# Patient Record
Sex: Male | Born: 1940
Health system: Southern US, Community
[De-identification: ages and names within clinical notes are randomized; demographics above are authoritative.]

## PROBLEM LIST (undated history)

## (undated) DIAGNOSIS — J310 Chronic rhinitis: Secondary | ICD-10-CM

## (undated) DIAGNOSIS — F419 Anxiety disorder, unspecified: Secondary | ICD-10-CM

## (undated) DIAGNOSIS — I639 Cerebral infarction, unspecified: Secondary | ICD-10-CM

## (undated) DIAGNOSIS — G4733 Obstructive sleep apnea (adult) (pediatric): Secondary | ICD-10-CM

## (undated) DIAGNOSIS — E039 Hypothyroidism, unspecified: Secondary | ICD-10-CM

## (undated) DIAGNOSIS — S065X9A Traumatic subdural hemorrhage with loss of consciousness of unspecified duration, initial encounter: Secondary | ICD-10-CM

## (undated) DIAGNOSIS — F329 Major depressive disorder, single episode, unspecified: Secondary | ICD-10-CM

## (undated) DIAGNOSIS — M1990 Unspecified osteoarthritis, unspecified site: Secondary | ICD-10-CM

## (undated) DIAGNOSIS — F32A Depression, unspecified: Secondary | ICD-10-CM

## (undated) DIAGNOSIS — R519 Headache, unspecified: Secondary | ICD-10-CM

## (undated) DIAGNOSIS — I2510 Atherosclerotic heart disease of native coronary artery without angina pectoris: Secondary | ICD-10-CM

## (undated) DIAGNOSIS — R51 Headache: Secondary | ICD-10-CM

## (undated) DIAGNOSIS — K219 Gastro-esophageal reflux disease without esophagitis: Secondary | ICD-10-CM

## (undated) DIAGNOSIS — E785 Hyperlipidemia, unspecified: Secondary | ICD-10-CM

## (undated) DIAGNOSIS — R0600 Dyspnea, unspecified: Secondary | ICD-10-CM

## (undated) DIAGNOSIS — I10 Essential (primary) hypertension: Secondary | ICD-10-CM

## (undated) HISTORY — PX: CORONARY ARTERY BYPASS GRAFT: SHX141

## (undated) HISTORY — DX: Major depressive disorder, single episode, unspecified: F32.9

## (undated) HISTORY — DX: Chronic rhinitis: J31.0

## (undated) HISTORY — DX: Essential (primary) hypertension: I10

## (undated) HISTORY — DX: Hyperlipidemia, unspecified: E78.5

## (undated) HISTORY — PX: BRAIN HEMATOMA EVACUATION: SHX573

## (undated) HISTORY — DX: Anxiety disorder, unspecified: F41.9

## (undated) HISTORY — PX: CATARACT EXTRACTION: SUR2

## (undated) HISTORY — PX: ROTATOR CUFF REPAIR: SHX139

## (undated) HISTORY — DX: Depression, unspecified: F32.A

## (undated) HISTORY — DX: Traumatic subdural hemorrhage with loss of consciousness of unspecified duration, initial encounter: S06.5X9A

## (undated) HISTORY — DX: Obstructive sleep apnea (adult) (pediatric): G47.33

## (undated) HISTORY — DX: Atherosclerotic heart disease of native coronary artery without angina pectoris: I25.10

## (undated) HISTORY — PX: APPENDECTOMY: SHX54

---

## 2000-08-18 ENCOUNTER — Ambulatory Visit (HOSPITAL_COMMUNITY): Admission: RE | Admit: 2000-08-18 | Discharge: 2000-08-18 | Payer: Self-pay | Admitting: *Deleted

## 2002-06-01 DIAGNOSIS — S065X9A Traumatic subdural hemorrhage with loss of consciousness of unspecified duration, initial encounter: Secondary | ICD-10-CM

## 2002-06-01 DIAGNOSIS — S065XAA Traumatic subdural hemorrhage with loss of consciousness status unknown, initial encounter: Secondary | ICD-10-CM

## 2002-06-01 HISTORY — DX: Traumatic subdural hemorrhage with loss of consciousness status unknown, initial encounter: S06.5XAA

## 2002-06-01 HISTORY — DX: Traumatic subdural hemorrhage with loss of consciousness of unspecified duration, initial encounter: S06.5X9A

## 2003-06-02 HISTORY — PX: OTHER SURGICAL HISTORY: SHX169

## 2003-09-03 ENCOUNTER — Inpatient Hospital Stay (HOSPITAL_COMMUNITY): Admission: EM | Admit: 2003-09-03 | Discharge: 2003-09-09 | Payer: Self-pay | Admitting: Cardiology

## 2003-10-15 ENCOUNTER — Encounter
Admission: RE | Admit: 2003-10-15 | Discharge: 2003-10-15 | Payer: Self-pay | Admitting: Thoracic Surgery (Cardiothoracic Vascular Surgery)

## 2004-11-26 ENCOUNTER — Encounter: Payer: Self-pay | Admitting: Pulmonary Disease

## 2004-12-15 ENCOUNTER — Encounter: Payer: Self-pay | Admitting: Pulmonary Disease

## 2009-03-11 ENCOUNTER — Encounter: Payer: Self-pay | Admitting: Pulmonary Disease

## 2010-07-28 DIAGNOSIS — E785 Hyperlipidemia, unspecified: Secondary | ICD-10-CM

## 2010-07-28 DIAGNOSIS — J310 Chronic rhinitis: Secondary | ICD-10-CM | POA: Insufficient documentation

## 2010-07-28 DIAGNOSIS — F329 Major depressive disorder, single episode, unspecified: Secondary | ICD-10-CM

## 2010-07-28 DIAGNOSIS — I2510 Atherosclerotic heart disease of native coronary artery without angina pectoris: Secondary | ICD-10-CM | POA: Insufficient documentation

## 2010-07-28 DIAGNOSIS — F411 Generalized anxiety disorder: Secondary | ICD-10-CM

## 2010-07-28 DIAGNOSIS — I10 Essential (primary) hypertension: Secondary | ICD-10-CM

## 2010-07-28 DIAGNOSIS — F3289 Other specified depressive episodes: Secondary | ICD-10-CM

## 2010-07-28 HISTORY — DX: Hyperlipidemia, unspecified: E78.5

## 2010-07-28 HISTORY — DX: Generalized anxiety disorder: F41.1

## 2010-07-28 HISTORY — DX: Major depressive disorder, single episode, unspecified: F32.9

## 2010-07-28 HISTORY — DX: Essential (primary) hypertension: I10

## 2010-07-28 HISTORY — DX: Other specified depressive episodes: F32.89

## 2010-07-29 ENCOUNTER — Institutional Professional Consult (permissible substitution) (INDEPENDENT_AMBULATORY_CARE_PROVIDER_SITE_OTHER): Payer: Medicare Other | Admitting: Pulmonary Disease

## 2010-07-29 ENCOUNTER — Encounter: Payer: Self-pay | Admitting: Pulmonary Disease

## 2010-07-29 ENCOUNTER — Telehealth: Payer: Self-pay | Admitting: Pulmonary Disease

## 2010-07-29 DIAGNOSIS — G4733 Obstructive sleep apnea (adult) (pediatric): Secondary | ICD-10-CM | POA: Insufficient documentation

## 2010-07-29 DIAGNOSIS — G2581 Restless legs syndrome: Secondary | ICD-10-CM | POA: Insufficient documentation

## 2010-07-29 HISTORY — DX: Restless legs syndrome: G25.81

## 2010-07-29 HISTORY — DX: Obstructive sleep apnea (adult) (pediatric): G47.33

## 2010-08-07 NOTE — Progress Notes (Signed)
Summary: recieved fax  Phone Note Other Incoming Call back at [phone]   Caller: ann moore//[facility] Summary of Call: Wants to know if you received her fax for pt today. Initial call taken by: Darletta Moll,  July 29, 2010 12:20 PM

## 2010-08-12 NOTE — Assessment & Plan Note (Signed)
Summary: consult for osa, ?rls   Visit Type:  Initial Consult Copy to:  [name] Primary Provider/Referring Provider:  [name]  CC:  Sleep Consult for sleep apnea.  Currently on a cpap machine with oxygen at 2.5 LPM bled into cpap machine. Marland Kitchen  History of Present Illness: the pt is a 70y/o male who I have been asked to see for management of osa.  He was diagnosed with mild to moderate osa 2006, with AHI 18/hr and also found to have PLMS arousal index of 8/hr.  The pt was started on cpap, but did not see a difference in sleep quality.  He stayed on the device since it controlled his snoring.  The pt had a cpap titration study in 2010 with showed optimal pressure of 8cm, but again was noted to have large numbers of plms with an arousal index of 10/hr.  The pt recently received a new cpap machine, and is using nasal pillows.  He denies mouth opening being an issue.  He goes to bed at 10pm, and arises btw 5-8am depending on work schedule. His wife thinks he has restless sleep despite the cpap, and he does not feel completely rested in the am's upon arising.  His wife has not heard breakthru snoring, but the pt does not feel he sleeps that much better with cpap.  He has daytime sleepiness with periods of inactivity, and his wife states that he can fall asleep easily reading or watching tv.  The pt has occasional RLS symptoms in the evening, but his wife states that he does kick a lot at night.  She also believes the kicks awaken him at night, though he is not aware.  He does not recall if he has ever been on a dopamine agonist.  He did have an ONO last DEC that showed desat to 79% even with cpap, but only spent 40 sec the entire night less than 88% (not clinically significant).    Preventive Screening-Counseling & Management  Alcohol-Tobacco     Smoking Status: never  Current Medications (verified): 1)  Aspirin Ec Low Dose 81 Mg Tbec (Aspirin) .... Take 1 Tablet By Mouth Once A Day 2)   Vitamin D 1000 Unit Tabs (Cholecalciferol) .... Take 1 Tablet By Mouth Once A Day 3)  Flonase 50 Mcg/act Susp (Fluticasone Propionate) .... 2 Sprays in Each Nostril Each Am 4)  Lexapro 20 Mg Tabs (Escitalopram Oxalate) .... Take 1 Tablet By Mouth Once A Day 5)  Crestor 10 Mg Tabs (Rosuvastatin Calcium) .... Take 1 Tablet By Mouth Once A Day 6)  Carvedilol 12.5 Mg Tabs (Carvedilol) .... Take 1 Tablet By Mouth Two Times A Day 7)  Amlodipine Besylate 5 Mg Tabs (Amlodipine Besylate) .... Take 1 Tablet By Mouth Once A Day 8)  Super Probiotic  Caps (Probiotic Product) .... Take 1 Tablet By Mouth Once A Day 9)  Coq10 100 Mg Caps (Coenzyme Q10) .... Take 1 Tablet By Mouth Once A Day 10)  Fish Oil 1000 Mg Caps (Omega-3 Fatty Acids) .... Take 1 Tablet By Mouth Once A Day 11)  Astepro 0.15 % Soln (Azelastine Hcl) .... 2 Sprays in Each Nostril At Bedtime  Allergies (verified): No Known Drug Allergies  Past History:  Past Medical History:  OBSTRUCTIVE SLEEP APNEA (ICD-327.23)--AHI 18/hr 2006 DEPRESSION (ICD-311) HYPERTENSION (ICD-401.9) ANXIETY (ICD-300.00) CHRONIC RHINITIS (ICD-472.0) CORONARY ARTERY DISEASE (ICD-414.00) HYPERLIPIDEMIA (ICD-272.4)    Past Surgical History: heart bypass 2005 appendectomy 1970s subdural hematoma 2004  Family History: Reviewed  history and no changes required. heart disease: father cancer: son (leukemia) mother: stroke pt is an only child  Social History: Reviewed history and no changes required. Patient never smoked.  pt is married and lives with spouse, [name]. Pt has children. 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  The patient denies shortness of breath at rest, productive cough, non-productive cough, coughing up blood, chest  pain, irregular heartbeats, acid heartburn, indigestion, loss of appetite, weight change, abdominal pain, difficulty swallowing, sore throat, tooth/dental problems, itching, ear ache, depression, hand/feet swelling, joint stiffness or pain, rash, change in color of mucus, and fever.    Vital Signs:  Patient profile:   70 year old male Height:      69 inches Weight:      198.38 pounds BMI:     29.40 O2 Sat:      97 % on Room air Temp:     97.9 degrees F oral Pulse rate:   69 / minute BP sitting:   110 / 62  (left arm) Cuff size:   large  Vitals Entered By: Arman Filter LPN (July 29, 2010 11:40 AM)  O2 Flow:  Room air CC: Sleep Consult for sleep apnea.  Currently on a cpap machine with oxygen at 2.5 LPM bled into cpap machine.  Comments Medications reviewed with patient Arman Filter LPN  July 29, 2010 11:56 AM    Physical Exam  General:  ow male in nad Eyes:  PERRLA and EOMI.   Nose:  patent without discharge no skin breakdown or pressure necrosis from cpap mask  Mouth:  elongation of soft palate and uvula, no exudates seen.  Neck:  no jvd, tmg, LN Lungs:  clear to auscultation  Heart:  rrr, no mrg Abdomen:  soft and nontender, bs+ Extremities:  no significant edema, no cyanosis  pulses intact distally Neurologic:  awake and oriented, moves all 4  appears mildly sleepy   Impression & Recommendations:  Problem # 1:  OBSTRUCTIVE SLEEP APNEA (ICD-327.23) the pt has a h/o mild to mod osa which is being treated with cpap.  He has new equipment and mask, and tells me he is wearing compliantly.  Despite this, he continues to have nonrestorative sleep and inappropriate daytime sleepiness.  He also has transient oxygen desats despite wearing cpap.  He has not had his pressure optimized in awhile, and it may be this is all caused by inadequate pressure.  Will recheck his pressure with autotitration device at home, and see if his pressure needs are being met.  I have also  encouraged him to work on weight loss.  With regards to his oxygen, his desats were transitory and not significant enough to keep on treatment.  Will address once we get his pressure recalibrated.  Problem # 2:  RESTLESS LEGS SYNDROME (ICD-333.94) the pt has been noted to have on all of his sleep studies large numbers of PLMS with significant arousal.  He occasionally has symptoms c/w with RLS in the evenings.  This may be the reason he has not completely resolved his sleep issues with cpap.  Will give him a trial of requip and see if he has a positive response.  Medications Added to Medication List This Visit: 1)  Vitamin D 1000 Unit Tabs (Cholecalciferol) .Marland KitchenMarland KitchenMarland Kitchen  Take 1 tablet by mouth once a day 2)  Flonase 50 Mcg/act Susp (Fluticasone propionate) .... 2 sprays in each nostril each am 3)  Amlodipine Besylate 5 Mg Tabs (Amlodipine besylate) .... Take 1 tablet by mouth once a day 4)  Super Probiotic Caps (Probiotic product) .... Take 1 tablet by mouth once a day 5)  Coq10 100 Mg Caps (Coenzyme q10) .... Take 1 tablet by mouth once a day 6)  Fish Oil 1000 Mg Caps (Omega-3 fatty acids) .... Take 1 tablet by mouth once a day 7)  Astepro 0.15 % Soln (Azelastine hcl) .... 2 sprays in each nostril at bedtime 8)  Requip 0.5 Mg Tabs (Ropinirole hcl) .Marland Kitchen.. 1-2 after dinner as directed.  Other Orders: Consultation Level IV (82505) DME Referral (DME)  Patient Instructions: 1)  will try requip 0.5mg  after dinner each night.  If it doesn't help, or if helps but not totally, increase to 2 tabs after dinner. 2)  will have [facility] re-optimize your pressure for you, and will call with results. 3)  work on weight loss for further treatment of your sleep apnea 4)  please call me in 2-3 weeks with how things have gone with the requip, and will set up followup visit after that.  Prescriptions: REQUIP 0.5 MG  TABS (ROPINIROLE HCL) 1-2 after dinner as directed.  #60 x 1   Entered and Authorized by:   Barbaraann Share  MD   Signed by:   Barbaraann Share MD on 07/29/2010   Method used:   Print then Give to Patient   RxID:   [id number]

## 2010-10-15 ENCOUNTER — Encounter: Payer: Self-pay | Admitting: *Deleted

## 2010-10-15 ENCOUNTER — Encounter: Payer: Self-pay | Admitting: Pulmonary Disease

## 2010-10-21 ENCOUNTER — Ambulatory Visit (INDEPENDENT_AMBULATORY_CARE_PROVIDER_SITE_OTHER): Payer: Medicare Other | Admitting: Pulmonary Disease

## 2010-10-21 ENCOUNTER — Encounter: Payer: Self-pay | Admitting: Pulmonary Disease

## 2010-10-21 DIAGNOSIS — G2581 Restless legs syndrome: Secondary | ICD-10-CM

## 2010-10-21 DIAGNOSIS — G4733 Obstructive sleep apnea (adult) (pediatric): Secondary | ICD-10-CM

## 2010-10-21 NOTE — Assessment & Plan Note (Signed)
The pt has a lot of leg jerks on his sleep evaluations in the past, but this may just be due to his sleep disordered breathing and not due to RLS.  The wife feels leg jerks have not been a problem since he has been on auto cpap mode, and this may lend credence to this theory.

## 2010-10-21 NOTE — Assessment & Plan Note (Addendum)
The pt has been wearing cpap compliantly on auto mode, and his wife feels he is sleeping better.  We need to get his download while on auto to set an optimized fixed pressure, and then we can check his ONO to see if he still needs oxygen.  I have asked him to work more aggressively on weight loss.

## 2010-10-21 NOTE — Progress Notes (Signed)
  Subjective:    Patient ID: [name], male    DOB: [birth year], 70 y.o.   MRN: [id number]  HPI The pt comes in today for f/u of his known osa, and ?RLS.  He has been treated with a trial of requip, but had issues with nausea and did not feel it made a difference to his sleep.  We also had his machine placed on auto to reoptimize his pressure, but have yet to receive his download.  The pt feels he did sleep better on the auto mode.  He is very anxious to get off oxygen at HS, and will check ONO on optimal pressure to document this.    Review of Systems  Constitutional: Negative for fever and unexpected weight change.  HENT: Positive for congestion, rhinorrhea and sinus pressure. Negative for ear pain, nosebleeds, sore throat, sneezing, trouble swallowing, dental problem and postnasal drip.   Eyes: Negative for redness and itching.  Respiratory: Negative for cough, chest tightness, shortness of breath and wheezing.   Cardiovascular: Positive for leg swelling. Negative for palpitations.  Gastrointestinal: Negative for nausea and vomiting.  Genitourinary: Negative for dysuria.  Musculoskeletal: Negative for joint swelling.  Skin: Negative for rash.  Neurological: Negative for headaches.  Hematological: Bruises/bleeds easily.  Psychiatric/Behavioral: Negative for dysphoric mood. The patient is not nervous/anxious.        Objective:   Physical Exam Ow male in nad No skin breakdown or pressure necrosis from cpap mask Cor with rrr LE with no edema, no cyanosis noted. Alert, does not appear sleepy, moves all 4        Assessment & Plan:

## 2010-10-21 NOTE — Patient Instructions (Signed)
Will get download from [facility], and have your pressure adjusted. Will check oxygen level overnight once your pressure is set on appropriate level. Work on weight loss followup with me one year if doing well, but call if having problems or questions.

## 2010-10-22 ENCOUNTER — Other Ambulatory Visit: Payer: Self-pay | Admitting: Pulmonary Disease

## 2010-10-22 DIAGNOSIS — G4733 Obstructive sleep apnea (adult) (pediatric): Secondary | ICD-10-CM

## 2010-10-30 ENCOUNTER — Telehealth: Payer: Self-pay | Admitting: Pulmonary Disease

## 2010-10-30 NOTE — Telephone Encounter (Signed)
Called and spoke with pt's wife.  Wife stated pt still hasn't heard from [facility] regarding getting pt set on fixed pressure of 12cm and then to do an ONO 1 week after getting pressure set.  Looks like [facility] sent order for this to [facility] on 5/23.  Pt's wife states they use the [facility] in [place] and this has happened to them multiple times- orders getting lost or a huge delay from when we send the order to when [facility] addresses them with the pt.  Wife is requesting we call Tobi Bastos at the [place] office in [place] and inform her of KC's recs/orders.  Will forward message to [facility] to address.

## 2010-10-31 NOTE — Telephone Encounter (Signed)
Called [facility] in [place] and spoke with Tobi Bastos. She stated they never received order. Re-faxed order to [phone]. Called back to [place] and spoke with Tobi Bastos to verify that order was received. Asked Tobi Bastos to contact pt today to arrange cpap pressure change and in one week, ono on cpap pressure of 12 on room air. Download and fax report to Dr. Shelle Iron. Tobi Bastos stated that she would contact pt today to arrange. Returned call to patient, no answer. Left message on answering machine of above and that Tobi Bastos will contact them today to arrange. If they had any questions or have not been contacted by [facility] to arrange, to call me back at [phone]. Rhonda J Cobb

## 2010-11-21 ENCOUNTER — Telehealth: Payer: Self-pay | Admitting: Pulmonary Disease

## 2010-11-21 ENCOUNTER — Other Ambulatory Visit: Payer: Self-pay | Admitting: Pulmonary Disease

## 2010-11-21 DIAGNOSIS — G4733 Obstructive sleep apnea (adult) (pediatric): Secondary | ICD-10-CM

## 2010-11-21 NOTE — Telephone Encounter (Signed)
Called, spoke with Tobi Bastos with [facility].  She states pt had ONO on cpap with no o2 done -- results were faxed on June 20th.  Calling to make sure KC did receive these results.  If not, she will be happy to refax.  KC, pls advise if you received these.  Thanks!

## 2010-11-21 NOTE — Telephone Encounter (Signed)
Let pt know that his low sat was 81%, but only spent 16 sec the entire night less than 88% Does not need oxygen as long as he wears his cpap.  Will send order to dme to d/c oxygen

## 2010-11-21 NOTE — Telephone Encounter (Signed)
ONO results in Kc's very important look at folder.

## 2010-11-24 NOTE — Telephone Encounter (Signed)
Pt's wife aware of results and that [facility] will be out this week to p/u o2

## 2011-01-06 ENCOUNTER — Ambulatory Visit (HOSPITAL_BASED_OUTPATIENT_CLINIC_OR_DEPARTMENT_OTHER)
Admission: RE | Admit: 2011-01-06 | Discharge: 2011-01-07 | Disposition: A | Discharge status: Home / Self Care | Payer: Medicare Other | Source: Ambulatory Visit | Attending: Orthopedic Surgery | Admitting: Orthopedic Surgery

## 2011-01-06 DIAGNOSIS — X58XXXA Exposure to other specified factors, initial encounter: Secondary | ICD-10-CM | POA: Insufficient documentation

## 2011-01-06 DIAGNOSIS — E785 Hyperlipidemia, unspecified: Secondary | ICD-10-CM | POA: Insufficient documentation

## 2011-01-06 DIAGNOSIS — S43429A Sprain of unspecified rotator cuff capsule, initial encounter: Secondary | ICD-10-CM | POA: Insufficient documentation

## 2011-01-06 DIAGNOSIS — S43499A Other sprain of unspecified shoulder joint, initial encounter: Secondary | ICD-10-CM | POA: Insufficient documentation

## 2011-01-06 DIAGNOSIS — M24119 Other articular cartilage disorders, unspecified shoulder: Secondary | ICD-10-CM | POA: Insufficient documentation

## 2011-01-06 DIAGNOSIS — I252 Old myocardial infarction: Secondary | ICD-10-CM | POA: Insufficient documentation

## 2011-01-06 DIAGNOSIS — G4733 Obstructive sleep apnea (adult) (pediatric): Secondary | ICD-10-CM | POA: Insufficient documentation

## 2011-01-06 DIAGNOSIS — I2510 Atherosclerotic heart disease of native coronary artery without angina pectoris: Secondary | ICD-10-CM | POA: Insufficient documentation

## 2011-01-06 DIAGNOSIS — Z951 Presence of aortocoronary bypass graft: Secondary | ICD-10-CM | POA: Insufficient documentation

## 2011-01-06 DIAGNOSIS — Z79899 Other long term (current) drug therapy: Secondary | ICD-10-CM | POA: Insufficient documentation

## 2011-01-06 DIAGNOSIS — M19019 Primary osteoarthritis, unspecified shoulder: Secondary | ICD-10-CM | POA: Insufficient documentation

## 2011-01-06 DIAGNOSIS — I10 Essential (primary) hypertension: Secondary | ICD-10-CM | POA: Insufficient documentation

## 2011-01-06 DIAGNOSIS — R9431 Abnormal electrocardiogram [ECG] [EKG]: Secondary | ICD-10-CM | POA: Insufficient documentation

## 2011-01-06 LAB — POCT I-STAT 4, (NA,K, GLUC, HGB,HCT)
HCT: 38 % — ABNORMAL LOW (ref 39.0–52.0)
Hemoglobin: 12.9 g/dL — ABNORMAL LOW (ref 13.0–17.0)
Potassium: 4.2 mEq/L (ref 3.5–5.1)
Sodium: 141 mEq/L (ref 135–145)

## 2011-01-12 NOTE — Op Note (Signed)
[name], [name]               ACCOUNT NO.:  [id number]  MEDICAL RECORD NO.:  [id number]  LOCATION:                                 FACILITY:  PHYSICIAN:  Marlowe Kays, M.D.       DATE OF BIRTH:  DATE OF PROCEDURE:  01/06/2011 DATE OF DISCHARGE:                              OPERATIVE REPORT   PREOPERATIVE DIAGNOSES: 1. Labral tear and partial proximal long-head biceps tendon tear. 2. Acromioclavicular joint arthritis. 3. Near full-thickness rotator cuff tear of right shoulder.  POSTOPERATIVE DIAGNOSES: 1. Labral tear and partial proximal long head biceps tendon tear. 2. Acromioclavicular joint arthritis. 3. Near full-thickness rotator cuff tear of right shoulder.  OPERATION: 1. Right shoulder arthroscopy with debridement of labrum and partial     biceps tendon tear. 2. Open distal clavicle resection. 3. Open anterior acromionectomy repair of 2 tears in the rotator cuff.  SURGEON:  Marlowe Kays, MD  ASSISTANT:  Druscilla Brownie. Idolina Primer, PA-C  ANESTHESIA:  General.  INDICATION FOR PROCEDURE:  He has had right shoulder pain for some time, aggravated by a fall on the shower with an MRI on September 23, 2010 demonstrating the preoperative diagnoses.  Accordingly, he is here for the above-mentioned surgery.  PROCEDURE IN DETAIL:  Prophylactic antibiotics, interscalene block by anesthesiologist, then general anesthesia; beach-chair position on the sliding frame.  Right shoulder girdle was prepped with DuraPrep, draped in sterile field.  Time-out was performed.  Then, the shoulder joint was marked out, and for hemostatic purposes, I injected the posterior portal and proposed incisions for the distal clavicle resection and the rotator cuff with Marcaine with adrenaline.  Through posterior soft-spot portal, I atraumatically entered the glenohumeral joint.  Mainly noted was considerable fraying of the long head of biceps tendon.  It was not immediately apparent whether or not  this was rotator cuff, biceps tendon, or both.  The labral degenerative tear particularly centered on the biceps anchor was also noted.  Accordingly, I advanced the scope between the biceps tendon subscapularis, and using a switching stick, we made an anterior incision.  Over the switching stick, I placed a metal cannula followed by a 4.2 shaver entering the joint.  I then debrided down the labral tear and began debriding down the biceps tendon, which was all in the few centimeters just before the attachment to the glenoid and not as it exited the groove.  On debriding it down, I got a much better look of the long head of biceps tendon and it was less than 50% involved with tear, so I felt like the appropriate treatment was to leave it.  I then made an incision over the distal clavicle identifying the [facility] joint and measuring a centimeter medial to it, there undermined the clavicle and protecting the underlying structures with baby Homans. We used a micro saw to excise the clavicle at that time and removing the fragment with towel clip and cautery technique.  Small residual fragments of bone on the apparent clavicle,  I removed with small rongeur, and then covered the raw bone with bone wax.  I then extended the incision distally and placed a small Cobb elevator beneath the anterior  acromion and made my first anterior acromionectomy with a micro saw.  He had a significant impingement problem, and I removed additional bone.  There was a good bit of bursal tissue in one area on removing a good bit of bleeding; and on exploring that area, I found a centimeter full-thickness tear in the rotator cuff.  I then looked at the insertion of the rotator cuff and found an area of __________discoloration, but indentation and I made a centimeter of tear horizontally at this location and did find retracted rotator cuff tear beneath the external rotator cuff.  Consequently, I used a 4-stranded Stryker  rotator cuff anchor and roughened up the bone slightly just above the insertion and then placed the 4 strands with the 2 posterior ones through the retracted portion of rotator cuff as well as the apparent rotator cuff and then cinched the rotator cuff down lateralward with additional sutures on the terminal portion, so that there was a double layer of repair.  I then used some residual suture from the anchor to put a number of side-to-side sutures and the centimeter tear up underneath the acromion and clavicle.  Pictures of all those were taken.  Wound was irrigated with sterile saline and closure performed with interrupted #1 Vicryl in the muscle and fascia overlying the anterior acromion and distal clavicle, 2-0 Vicryl on the subcutaneous tissue, Steri-Strips on the skin except for the portals which were closed with 4-0 nylon, and covered with Betadine, Adaptic, dry sterile dressing, and shoulder immobilizer.  He tolerated the procedure well __________ recovery room in satisfactory condition with no known complications.          ______________________________ Marlowe Kays, M.D.     JA/MEDQ  D:  01/06/2011  T:  01/07/2011  Job:  [id number]  Electronically Signed by Marlowe Kays M.D. on 01/12/2011 01:12:03 PM

## 2011-10-21 ENCOUNTER — Ambulatory Visit: Payer: Medicare Other | Admitting: Pulmonary Disease

## 2015-12-16 DIAGNOSIS — G5603 Carpal tunnel syndrome, bilateral upper limbs: Secondary | ICD-10-CM | POA: Insufficient documentation

## 2016-04-21 ENCOUNTER — Other Ambulatory Visit: Payer: Self-pay | Admitting: Orthopedic Surgery

## 2016-05-11 DIAGNOSIS — Z951 Presence of aortocoronary bypass graft: Secondary | ICD-10-CM | POA: Insufficient documentation

## 2016-05-11 DIAGNOSIS — I2510 Atherosclerotic heart disease of native coronary artery without angina pectoris: Secondary | ICD-10-CM

## 2016-05-11 DIAGNOSIS — I255 Ischemic cardiomyopathy: Secondary | ICD-10-CM

## 2016-05-11 DIAGNOSIS — E785 Hyperlipidemia, unspecified: Secondary | ICD-10-CM | POA: Insufficient documentation

## 2016-05-11 HISTORY — DX: Ischemic cardiomyopathy: I25.5

## 2016-05-11 HISTORY — DX: Atherosclerotic heart disease of native coronary artery without angina pectoris: I25.10

## 2016-05-11 HISTORY — DX: Presence of aortocoronary bypass graft: Z95.1

## 2016-05-20 ENCOUNTER — Encounter (HOSPITAL_BASED_OUTPATIENT_CLINIC_OR_DEPARTMENT_OTHER): Payer: Self-pay | Admitting: *Deleted

## 2016-05-21 ENCOUNTER — Encounter (HOSPITAL_BASED_OUTPATIENT_CLINIC_OR_DEPARTMENT_OTHER): Payer: Self-pay | Admitting: *Deleted

## 2016-05-21 ENCOUNTER — Ambulatory Visit (HOSPITAL_BASED_OUTPATIENT_CLINIC_OR_DEPARTMENT_OTHER)
Admission: RE | Admit: 2016-05-21 | Discharge: 2016-05-21 | Disposition: A | Discharge status: Home / Self Care | Payer: Medicare Other | Source: Ambulatory Visit | Attending: Orthopedic Surgery | Admitting: Orthopedic Surgery

## 2016-05-21 ENCOUNTER — Ambulatory Visit (HOSPITAL_BASED_OUTPATIENT_CLINIC_OR_DEPARTMENT_OTHER): Payer: Medicare Other | Admitting: Anesthesiology

## 2016-05-21 ENCOUNTER — Encounter (HOSPITAL_BASED_OUTPATIENT_CLINIC_OR_DEPARTMENT_OTHER)
Admission: RE | Disposition: A | Discharge status: Home / Self Care | Payer: Self-pay | Source: Ambulatory Visit | Attending: Orthopedic Surgery

## 2016-05-21 DIAGNOSIS — F419 Anxiety disorder, unspecified: Secondary | ICD-10-CM | POA: Insufficient documentation

## 2016-05-21 DIAGNOSIS — I2510 Atherosclerotic heart disease of native coronary artery without angina pectoris: Secondary | ICD-10-CM | POA: Diagnosis not present

## 2016-05-21 DIAGNOSIS — E785 Hyperlipidemia, unspecified: Secondary | ICD-10-CM | POA: Diagnosis not present

## 2016-05-21 DIAGNOSIS — G4733 Obstructive sleep apnea (adult) (pediatric): Secondary | ICD-10-CM | POA: Diagnosis not present

## 2016-05-21 DIAGNOSIS — E039 Hypothyroidism, unspecified: Secondary | ICD-10-CM | POA: Insufficient documentation

## 2016-05-21 DIAGNOSIS — Z951 Presence of aortocoronary bypass graft: Secondary | ICD-10-CM | POA: Diagnosis not present

## 2016-05-21 DIAGNOSIS — G5603 Carpal tunnel syndrome, bilateral upper limbs: Secondary | ICD-10-CM | POA: Diagnosis present

## 2016-05-21 DIAGNOSIS — Z79899 Other long term (current) drug therapy: Secondary | ICD-10-CM | POA: Insufficient documentation

## 2016-05-21 DIAGNOSIS — K219 Gastro-esophageal reflux disease without esophagitis: Secondary | ICD-10-CM | POA: Insufficient documentation

## 2016-05-21 DIAGNOSIS — F329 Major depressive disorder, single episode, unspecified: Secondary | ICD-10-CM | POA: Insufficient documentation

## 2016-05-21 DIAGNOSIS — I10 Essential (primary) hypertension: Secondary | ICD-10-CM | POA: Insufficient documentation

## 2016-05-21 DIAGNOSIS — Z8673 Personal history of transient ischemic attack (TIA), and cerebral infarction without residual deficits: Secondary | ICD-10-CM | POA: Diagnosis not present

## 2016-05-21 HISTORY — DX: Headache, unspecified: R51.9

## 2016-05-21 HISTORY — DX: Hypothyroidism, unspecified: E03.9

## 2016-05-21 HISTORY — PX: CARPAL TUNNEL RELEASE: SHX101

## 2016-05-21 HISTORY — DX: Unspecified osteoarthritis, unspecified site: M19.90

## 2016-05-21 HISTORY — DX: Dyspnea, unspecified: R06.00

## 2016-05-21 HISTORY — DX: Cerebral infarction, unspecified: I63.9

## 2016-05-21 HISTORY — DX: Gastro-esophageal reflux disease without esophagitis: K21.9

## 2016-05-21 HISTORY — DX: Headache: R51

## 2016-05-21 LAB — POCT I-STAT, CHEM 8
BUN: 22 mg/dL — AB (ref 6–20)
CREATININE: 1.5 mg/dL — AB (ref 0.61–1.24)
Calcium, Ion: 1.25 mmol/L (ref 1.15–1.40)
Chloride: 105 mmol/L (ref 101–111)
GLUCOSE: 117 mg/dL — AB (ref 65–99)
HEMATOCRIT: 45 % (ref 39.0–52.0)
HEMOGLOBIN: 15.3 g/dL (ref 13.0–17.0)
POTASSIUM: 4 mmol/L (ref 3.5–5.1)
Sodium: 143 mmol/L (ref 135–145)
TCO2: 21 mmol/L (ref 0–100)

## 2016-05-21 SURGERY — CARPAL TUNNEL RELEASE
Anesthesia: Monitor Anesthesia Care | Site: Wrist | Laterality: Right

## 2016-05-21 MED ORDER — SCOPOLAMINE 1 MG/3DAYS TD PT72
1.0000 | MEDICATED_PATCH | Freq: Once | TRANSDERMAL | Status: DC | PRN
Start: 2016-05-21 — End: 2016-05-21

## 2016-05-21 MED ORDER — LIDOCAINE HCL (PF) 0.5 % IJ SOLN
INTRAMUSCULAR | Status: DC | PRN
Start: 2016-05-21 — End: 2016-05-21
  Administered 2016-05-21: 30 mL via INTRAVENOUS

## 2016-05-21 MED ORDER — ONDANSETRON HCL 4 MG/2ML IJ SOLN
INTRAMUSCULAR | Status: AC
  Filled 2016-05-21: qty 2

## 2016-05-21 MED ORDER — MIDAZOLAM HCL 2 MG/2ML IJ SOLN
INTRAMUSCULAR | Status: AC
  Filled 2016-05-21: qty 2

## 2016-05-21 MED ORDER — LACTATED RINGERS IV SOLN
INTRAVENOUS | Status: DC
  Administered 2016-05-21: 08:00:00 via INTRAVENOUS

## 2016-05-21 MED ORDER — CEFAZOLIN SODIUM-DEXTROSE 2-4 GM/100ML-% IV SOLN
2.0000 g | INTRAVENOUS | Status: AC
  Administered 2016-05-21: 2 g via INTRAVENOUS

## 2016-05-21 MED ORDER — BUPIVACAINE HCL (PF) 0.25 % IJ SOLN
INTRAMUSCULAR | Status: DC | PRN
  Administered 2016-05-21: 8 mL

## 2016-05-21 MED ORDER — LIDOCAINE HCL (PF) 0.5 % IJ SOLN
INTRAMUSCULAR | Status: AC
  Filled 2016-05-21: qty 50

## 2016-05-21 MED ORDER — CEFAZOLIN SODIUM-DEXTROSE 2-4 GM/100ML-% IV SOLN
INTRAVENOUS | Status: AC
  Filled 2016-05-21: qty 100

## 2016-05-21 MED ORDER — MIDAZOLAM HCL 2 MG/2ML IJ SOLN: 1.0000 mg | INTRAMUSCULAR | Status: DC | PRN

## 2016-05-21 MED ORDER — CHLORHEXIDINE GLUCONATE 4 % EX LIQD: 60.0000 mL | Freq: Once | CUTANEOUS | Status: DC

## 2016-05-21 MED ORDER — FENTANYL CITRATE (PF) 100 MCG/2ML IJ SOLN: 25.0000 ug | INTRAMUSCULAR | Status: DC | PRN

## 2016-05-21 MED ORDER — FENTANYL CITRATE (PF) 100 MCG/2ML IJ SOLN: 50.0000 ug | INTRAMUSCULAR | Status: DC | PRN

## 2016-05-21 MED ORDER — ONDANSETRON HCL 4 MG/2ML IJ SOLN
INTRAMUSCULAR | Status: DC | PRN
  Administered 2016-05-21: 4 mg via INTRAVENOUS

## 2016-05-21 MED ORDER — HYDROCODONE-ACETAMINOPHEN 5-325 MG PO TABS: 1.0000 | ORAL_TABLET | Freq: Four times a day (QID) | ORAL | 0 refills | Status: DC | PRN

## 2016-05-21 MED ORDER — MIDAZOLAM HCL 5 MG/5ML IJ SOLN
INTRAMUSCULAR | Status: DC | PRN
  Administered 2016-05-21: 0.5 mg via INTRAVENOUS
  Administered 2016-05-21: 1 mg via INTRAVENOUS

## 2016-05-21 MED ORDER — PROPOFOL 10 MG/ML IV BOLUS
INTRAVENOUS | Status: AC
  Filled 2016-05-21: qty 40

## 2016-05-21 MED ORDER — FENTANYL CITRATE (PF) 100 MCG/2ML IJ SOLN
INTRAMUSCULAR | Status: DC | PRN
  Administered 2016-05-21 (×2): 50 ug via INTRAVENOUS

## 2016-05-21 MED ORDER — FENTANYL CITRATE (PF) 100 MCG/2ML IJ SOLN
INTRAMUSCULAR | Status: AC
  Filled 2016-05-21: qty 2

## 2016-05-21 SURGICAL SUPPLY — 37 items
BLADE SURG 15 STRL LF DISP TIS (BLADE) ×1 IMPLANT
BLADE SURG 15 STRL SS (BLADE) ×3
BNDG CMPR 9X4 STRL LF SNTH (GAUZE/BANDAGES/DRESSINGS)
BNDG COHESIVE 3X5 TAN STRL LF (GAUZE/BANDAGES/DRESSINGS) ×3 IMPLANT
BNDG ESMARK 4X9 LF (GAUZE/BANDAGES/DRESSINGS) IMPLANT
BNDG GAUZE ELAST 4 BULKY (GAUZE/BANDAGES/DRESSINGS) ×3 IMPLANT
CHLORAPREP W/TINT 26ML (MISCELLANEOUS) ×3 IMPLANT
CORDS BIPOLAR (ELECTRODE) ×3 IMPLANT
COVER BACK TABLE 60X90IN (DRAPES) ×3 IMPLANT
COVER MAYO STAND STRL (DRAPES) ×3 IMPLANT
CUFF TOURNIQUET SINGLE 18IN (TOURNIQUET CUFF) ×3 IMPLANT
DRAPE EXTREMITY T 121X128X90 (DRAPE) ×3 IMPLANT
DRAPE SURG 17X23 STRL (DRAPES) ×3 IMPLANT
DRSG PAD ABDOMINAL 8X10 ST (GAUZE/BANDAGES/DRESSINGS) ×3 IMPLANT
GAUZE SPONGE 4X4 12PLY STRL (GAUZE/BANDAGES/DRESSINGS) ×3 IMPLANT
GAUZE XEROFORM 1X8 LF (GAUZE/BANDAGES/DRESSINGS) ×3 IMPLANT
GLOVE BIOGEL M 7.0 STRL (GLOVE) ×2 IMPLANT
GLOVE BIOGEL PI IND STRL 8 (GLOVE) IMPLANT
GLOVE BIOGEL PI IND STRL 8.5 (GLOVE) ×1 IMPLANT
GLOVE BIOGEL PI INDICATOR 8 (GLOVE) ×2
GLOVE BIOGEL PI INDICATOR 8.5 (GLOVE) ×2
GLOVE ECLIPSE 6.5 STRL STRAW (GLOVE) ×2 IMPLANT
GLOVE SURG ORTHO 8.0 STRL STRW (GLOVE) ×3 IMPLANT
GOWN STRL REUS W/ TWL LRG LVL3 (GOWN DISPOSABLE) ×1 IMPLANT
GOWN STRL REUS W/TWL LRG LVL3 (GOWN DISPOSABLE) ×3
GOWN STRL REUS W/TWL XL LVL3 (GOWN DISPOSABLE) ×3 IMPLANT
NDL PRECISIONGLIDE 27X1.5 (NEEDLE) IMPLANT
NEEDLE PRECISIONGLIDE 27X1.5 (NEEDLE) ×3 IMPLANT
NS IRRIG 1000ML POUR BTL (IV SOLUTION) ×3 IMPLANT
PACK BASIN DAY SURGERY FS (CUSTOM PROCEDURE TRAY) ×3 IMPLANT
STOCKINETTE 4X48 STRL (DRAPES) ×3 IMPLANT
SUT ETHILON 4 0 PS 2 18 (SUTURE) ×3 IMPLANT
SUT VICRYL 4-0 PS2 18IN ABS (SUTURE) IMPLANT
SYR BULB 3OZ (MISCELLANEOUS) ×3 IMPLANT
SYR CONTROL 10ML LL (SYRINGE) ×2 IMPLANT
TOWEL OR 17X24 6PK STRL BLUE (TOWEL DISPOSABLE) ×3 IMPLANT
UNDERPAD 30X30 (UNDERPADS AND DIAPERS) ×3 IMPLANT

## 2016-05-21 NOTE — Transfer of Care (Signed)
Immediate Anesthesia Transfer of Care Note  Patient: [name]  Procedure(s) Performed: Procedure(s) with comments: CARPAL TUNNEL RELEASE, right (Right) - FAB  Patient Location: PACU  Anesthesia Type:Bier block  Level of Consciousness: awake, alert  and oriented  Airway & Oxygen Therapy: Patient Spontanous Breathing  Post-op Assessment: Report given to RN and Post -op Vital signs reviewed and stable  Post vital signs: Reviewed and stable  Last Vitals:  Vitals:   05/21/16 0724 05/21/16 0901  BP: 123/80   Pulse: 73 73  Resp: 18 17  Temp: 36.6 C     Last Pain:  Vitals:   05/21/16 0724  TempSrc: Oral      Patients Stated Pain Goal: 0 (05/21/16 0724)  Complications: No apparent anesthesia complications

## 2016-05-21 NOTE — Anesthesia Postprocedure Evaluation (Signed)
Anesthesia Post Note  Patient: [name]  Procedure(s) Performed: Procedure(s) (LRB): CARPAL TUNNEL RELEASE, right (Right)  Patient location during evaluation: PACU Anesthesia Type: MAC Level of consciousness: awake and alert Pain management: pain level controlled Vital Signs Assessment: post-procedure vital signs reviewed and stable Respiratory status: spontaneous breathing, nonlabored ventilation and respiratory function stable Cardiovascular status: stable and blood pressure returned to baseline Anesthetic complications: no       Last Vitals:  Vitals:   05/21/16 0915 05/21/16 0941  BP:    Pulse: 72 67  Resp: 18 18  Temp:  36.4 C    Last Pain:  Vitals:   05/21/16 0941  TempSrc:   PainSc: 0-No pain                 Dquan Cortopassi,W. EDMOND

## 2016-05-21 NOTE — Op Note (Signed)
NAMElita Quick:  ,                            ACCOUNT NO.:  [id number]  MEDICAL RECORD NO.:  [id number]  LOCATION:                                 FACILITY:  PHYSICIAN:  Cindee SaltGary Alla Sloma, M.D.            DATE OF BIRTH:  DATE OF PROCEDURE:  05/21/2016 DATE OF DISCHARGE:                              OPERATIVE REPORT   PREOPERATIVE DIAGNOSIS:  Carpal tunnel syndrome, right hand.  POSTOPERATIVE DIAGNOSIS:  Carpal tunnel syndrome, right hand.  OPERATION:  Decompression right median nerve.  SURGEON:  Cindee SaltGary Rosmery Duggin, M.D.  ANESTHESIA:  Forearm-based IV regional with local infiltration.  ANESTHESIOLOGIST:  Zenon MayoW. Edmond Fitzgerald, MD.  [facility]  HISTORY:  The patient is a 75 year old male, with a history of bilateral carpal tunnel syndrome.  EMG and nerve conduction are positive, which has not responded to conservative treatment.  He has elected to undergo surgical decompression of right median nerve.  Pre, peri, and postoperative course have been discussed along with risks and complications.  He is aware that there is no guarantee to the surgery; the possibility of infection; recurrence of injury to arteries, nerves, tendons; incomplete relief of symptoms and dystrophy.  In the preoperative area, the patient is seen, the extremity marked by both patient and surgeon.  Antibiotic given.  PROCEDURE IN DETAIL:  The patient was brought to the operating room, where a forearm-based IV regional anesthetic was carried out without difficulty under the direction of the Anesthesia Department.  He was prepped using ChloraPrep in the supine position with the right arm free. A 3-minute dry time was allowed and time-out taken, confirming the patient and procedure.  A longitudinal incision was made in the right palm, carried down through subcutaneous tissue.  Bleeders were electrocauterized with bipolar.  The palmar fascia was split. Superficial palmar arch was identified along with  the flexor tendon to the ring and little finger on the ulnar side of the flexor retinaculum, this was incised with sharp dissection after retractors protect the median nerve radial and the ulnar nerve ulnarly.  A right angle and Sewall retractor were placed between skin and forearm fascia.  The underlying structures were then separated off with blunt dissection.  A blunt scissors were then used to release the remainder of the flexor retinaculum and distal forearm fascia for approximately 2 cm proximal to the wrist crease under direct vision.  The canal was explored.  The compression to the nerve was apparent.  Motor branch entered into muscle distally.  It was an ulnar takeoff from the nerve.  The wound was copiously irrigated with saline.  The skin was then closed with interrupted 4-0 nylon sutures.  Local infiltration with 0.25% bupivacaine without epinephrine was given. Approximately 8 mL was used.  A compressive dressing with the fingers free was applied.  On deflation of the tourniquet, all fingers immediately pinked.  He was taken to the recovery room for observation in satisfactory condition.  He will be discharged to home [facility], on [place].  ______________________________ Cindee SaltGary Youssef Footman, M.D.     GK/MEDQ  D:  05/21/2016  T:  05/21/2016  Job:  [id number]

## 2016-05-21 NOTE — Anesthesia Preprocedure Evaluation (Addendum)
Anesthesia Evaluation  Patient identified by MRN, date of birth, ID band Patient awake    Reviewed: Allergy & Precautions, H&P , NPO status , Patient's Chart, lab work & pertinent test results, reviewed documented beta blocker date and time   Airway Mallampati: III  TM Distance: >3 FB Neck ROM: Full    Dental no notable dental hx. (+) Teeth Intact, Dental Advisory Given   Pulmonary sleep apnea ,    Pulmonary exam normal breath sounds clear to auscultation       Cardiovascular hypertension, Pt. on medications and Pt. on home beta blockers + CAD   Rhythm:Regular Rate:Normal     Neuro/Psych  Headaches, Anxiety Depression CVA negative psych ROS   GI/Hepatic negative GI ROS, Neg liver ROS, GERD  Controlled,  Endo/Other  Hypothyroidism   Renal/GU negative Renal ROS  negative genitourinary   Musculoskeletal  (+) Arthritis ,   Abdominal   Peds  Hematology negative hematology ROS (+)   Anesthesia Other Findings   Reproductive/Obstetrics negative OB ROS                            Anesthesia Physical Anesthesia Plan  ASA: III  Anesthesia Plan: MAC and Bier Block   Post-op Pain Management:    Induction: Intravenous  Airway Management Planned: Simple Face Mask  Additional Equipment:   Intra-op Plan:   Post-operative Plan:   Informed Consent: I have reviewed the patients History and Physical, chart, labs and discussed the procedure including the risks, benefits and alternatives for the proposed anesthesia with the patient or authorized representative who has indicated his/her understanding and acceptance.   Dental advisory given  Plan Discussed with: CRNA  Anesthesia Plan Comments:         Anesthesia Quick Evaluation

## 2016-05-21 NOTE — H&P (Signed)
[name] is an 75 y.o. male.   Chief Complaint: numbness right hand HPI: [name] is a 75 year old right hand dominant male referred by Dr. Sudie BaileyProchnau regarding numbness and tingling in his hands, right greater than left, that has been going on for a number of months. He has a history of arthritis of his neck and sees Dr. Ethelene Halamos for this. He has had shoulder surgery by Dr. Simonne ComeAplington. He complains of all his fingers being stiff. He is not complaining of pain. He is awakened at night. He states it is worse in the morning. He has no history of injury to the hand or to the neck. He has had an injection to his neck, which has given him some relief. He has had nerve conductions done. These notes along with Dr. Donita BrooksProchnau's are attempting to be review; however, the nerve conduction is so small that we are unable to read it. He has no history of diabetes. He has a history of thyroid problems and arthritis. No history of gout. Family history is negative for each of these. He is not taking any medicine for his hands .Nerve conductions by Dr. Anne HahnWillis have been obtained revealing bilateral carpal tunnel syndrome with a motor delay of 4.28 on the left, 4.52 on the right, sensory delay of 4.8 on the left and not recorded on the right.           Past Medical History:  Diagnosis Date  . Anxiety   . Arthritis    neck; limited neck movement  . Chronic rhinitis   . Coronary artery disease   . Depression    no treatment since he retired  . Dyspnea    with exertion  . GERD (gastroesophageal reflux disease)   . Headache    hx subdural hematoma  . Hyperlipidemia   . Hypertension   . Hypothyroidism   . OSA (obstructive sleep apnea)   . Stroke [facility](HCC) ~4278yrs ago   mild affects Lt eye  . Subdural hematoma (HCC) 2004    Past Surgical History:  Procedure Laterality Date  . APPENDECTOMY  1970s  . BRAIN HEMATOMA EVACUATION  ~2005   subdural hematoma  . CORONARY ARTERY BYPASS GRAFT  ~2006  . Heart  bypass  2005  . ROTATOR CUFF REPAIR Right ~4 years    Family History  Problem Relation Age of Onset  . Heart disease Father   . Leukemia Son   . Stroke Mother    Social History:  reports that he has never smoked. He has never used smokeless tobacco. He reports that he does not drink alcohol or use drugs.  Allergies: No Known Allergies  No prescriptions prior to admission.    No results found for this or any previous visit (from the past 48 hour(s)).  No results found.   Pertinent items are noted in HPI.  Height 5\' 10"  (1.778 m), weight 85.3 kg (188 lb).  General appearance: alert, cooperative and appears stated age Head: Normocephalic, without obvious abnormality Neck: no JVD Resp: clear to auscultation bilaterally Cardio: regular rate and rhythm, S1, S2 normal, no murmur, click, rub or gallop GI: soft, non-tender; bowel sounds normal; no masses,  no organomegaly Extremities: numbness right hand Pulses: 2+ and symmetric Skin: Skin color, texture, turgor normal. No rashes or lesions Neurologic: Grossly normal Incision/Wound: na  Assessment/Plan Assessment:  1. Bilateral carpal tunnel syndrome    Plan: He would like to have his carpal tunnels release. Would like to have the right side  done first. Pre-peri-and postoperative course been discussed along with risks and complications. He is aware that there is no guarantee to the surgery the possibility of infection recurrence injury to arteries nerves tendons incomplete release symptoms and dystrophy. Questions are encouraged and answered to his status satisfaction. He is scheduled for right carpal tunnel release and outpatient under regional anesthesia.      Ailey Wessling R 05/21/2016, 4:57 AM

## 2016-05-21 NOTE — Op Note (Signed)
Dictation Number [id number]

## 2016-05-21 NOTE — Brief Op Note (Signed)
05/21/2016  8:56 AM  PATIENT:  [name]  75 y.o. male  PRE-OPERATIVE DIAGNOSIS:  right carpal tunnel syndrome  POST-OPERATIVE DIAGNOSIS:  right carpal tunnel syndrome  PROCEDURE:  Procedure(s) with comments: CARPAL TUNNEL RELEASE, right (Right) - FAB  SURGEON:  Surgeon(s) and Role:    * Cindee SaltGary Pinkie Manger, MD - Primary  PHYSICIAN ASSISTANT:   ASSISTANTS: none   ANESTHESIA:   local and regional  EBL:  Total I/O In: -  Out: 5 [Blood:5]  BLOOD ADMINISTERED:none  DRAINS: none   LOCAL MEDICATIONS USED:  BUPIVICAINE   SPECIMEN:  No Specimen  DISPOSITION OF SPECIMEN:  N/A  COUNTS:  YES  TOURNIQUET:  * Missing tourniquet times found for documented tourniquets in log:  [id number] *  DICTATION: .Other Dictation: Dictation Number [id number]  PLAN OF CARE: Discharge to home after PACU  PATIENT DISPOSITION:  PACU - hemodynamically stable.

## 2016-05-21 NOTE — Discharge Instructions (Addendum)

## 2017-01-06 ENCOUNTER — Encounter: Payer: Self-pay | Admitting: Cardiology

## 2017-01-06 ENCOUNTER — Ambulatory Visit (INDEPENDENT_AMBULATORY_CARE_PROVIDER_SITE_OTHER): Payer: Medicare Other | Admitting: Cardiology

## 2017-01-06 VITALS — BP 124/58 | HR 80 | Resp 10 | Ht 70.0 in | Wt 195.4 lb

## 2017-01-06 DIAGNOSIS — I2510 Atherosclerotic heart disease of native coronary artery without angina pectoris: Secondary | ICD-10-CM | POA: Diagnosis not present

## 2017-01-06 DIAGNOSIS — G4733 Obstructive sleep apnea (adult) (pediatric): Secondary | ICD-10-CM | POA: Diagnosis not present

## 2017-01-06 DIAGNOSIS — I255 Ischemic cardiomyopathy: Secondary | ICD-10-CM | POA: Diagnosis not present

## 2017-01-06 DIAGNOSIS — E785 Hyperlipidemia, unspecified: Secondary | ICD-10-CM | POA: Diagnosis not present

## 2017-01-06 DIAGNOSIS — I10 Essential (primary) hypertension: Secondary | ICD-10-CM

## 2017-01-06 DIAGNOSIS — Z951 Presence of aortocoronary bypass graft: Secondary | ICD-10-CM

## 2017-01-06 MED ORDER — NITROGLYCERIN 0.4 MG SL SUBL: 0.4000 mg | SUBLINGUAL_TABLET | SUBLINGUAL | 1 refills | Status: DC | PRN

## 2017-01-06 NOTE — Progress Notes (Signed)
Cardiology Office Note:    Date:  01/06/2017   ID:  [name], DOB 08-Mar-1941, MRN [id number]  PCP:  [name], MD  Cardiologist:  [name], MD    Referring MD: [name], MD   Chief Complaint  Patient presents with  . Follow-up  Follow-up cardiac issues  History of Present Illness:    [name] is a 76 y.o. male  with history of coronary artery disease. He is doing well cardiac-wise but described to be weak and tired and short of breath with exertion. Denies having chest pain tightness squeezing pressure burning in the chest. He still fairly active. He does cut grass using a riding lawnmower injury due to Monday for about 4 hours and had no difficulty doing this. He did have cataract surgery done recently and recovering from it.  Past Medical History:  Diagnosis Date  . Anxiety   . Arthritis    neck; limited neck movement  . Chronic rhinitis   . Coronary artery disease   . Depression    no treatment since he retired  . Dyspnea    with exertion  . GERD (gastroesophageal reflux disease)   . Headache    hx subdural hematoma  . Hyperlipidemia   . Hypertension   . Hypothyroidism   . OSA (obstructive sleep apnea)   . Stroke [facility]) ~15yrs ago   mild affects Lt eye  . Subdural hematoma (HCC) 2004    Past Surgical History:  Procedure Laterality Date  . APPENDECTOMY  1970s  . BRAIN HEMATOMA EVACUATION  ~2005   subdural hematoma  . CARPAL TUNNEL RELEASE Right 05/21/2016   Procedure: CARPAL TUNNEL RELEASE, right;  Surgeon: Cindee Salt, MD;  Location: [facility];  Service: Orthopedics;  Laterality: Right;  FAB  . CATARACT EXTRACTION Left   . CORONARY ARTERY BYPASS GRAFT  ~2006  . Heart bypass  2005  . ROTATOR CUFF REPAIR Right ~4 years    Current Medications: Current Meds  Medication Sig  . aspirin 81 MG tablet Take 81 mg by mouth daily.    . carvedilol (COREG) 6.25 MG tablet Take 6.25 mg by mouth 2 (two) times daily.  .  Cholecalciferol (VITAMIN D) 1000 UNITS capsule Take 1,000 Units by mouth daily.    . Cyanocobalamin (B-12) 2500 MCG TABS Take 1 tablet by mouth daily.  Marland Kitchen levothyroxine (SYNTHROID, LEVOTHROID) 50 MCG tablet Take 1 tablet by mouth daily.  . Misc Natural Products (PROSTATE HEALTH PO) Take 1 tablet by mouth daily.  . Misc Natural Products (TART CHERRY ADVANCED PO) Take 1,200 mg by mouth daily.  . montelukast (SINGULAIR) 10 MG tablet Take 10 mg by mouth at bedtime.  . rosuvastatin (CRESTOR) 5 MG tablet Take 1 tablet by mouth daily.  . tamsulosin (FLOMAX) 0.4 MG CAPS capsule Take 1 capsule by mouth daily.  . timolol (BETIMOL) 0.5 % ophthalmic solution Place 1 drop into the left eye daily.  . TURMERIC PO Take 1 tablet by mouth daily.     Allergies:   Patient has no known allergies.   Social History   Social History  . Marital status: Married    Spouse name: [name]  . Number of children: 1  . Years of education: N/A   Occupational History  .      [name]  .      [employer]   Social History Main Topics  . Smoking status: Never Smoker  . Smokeless tobacco: Never Used  .  Alcohol use No  . Drug use: No  . Sexual activity: Not Asked   Other Topics Concern  . 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 ROS:   Please see the history of present illness.    All 14 point review of systems negative except as described per history of present illness  EKGs/Labs/Other Studies Reviewed:      Recent Labs: 05/21/2016: BUN 22; Creatinine, Ser 1.50; Hemoglobin 15.3; Potassium 4.0; Sodium 143  Recent Lipid Panel No results found for: CHOL, TRIG, HDL, CHOLHDL, VLDL, LDLCALC, LDLDIRECT  Physical Exam:    VS:  BP (!) 124/58   Pulse 80   Resp 10   Ht 5\' 10"  (1.778 m)   Wt 195 lb 6.4 oz  (88.6 kg)   BMI 28.04 kg/m     Wt Readings from Last 3 Encounters:  01/06/17 195 lb 6.4 oz (88.6 kg)  05/21/16 186 lb (84.4 kg)  10/21/10 200 lb 9.6 oz (91 kg)     GEN:  Well nourished, well developed in no acute distress HEENT: Normal NECK: No JVD; No carotid bruits LYMPHATICS: No lymphadenopathy CARDIAC: RRR, no murmurs, no rubs, no gallops RESPIRATORY:  Clear to auscultation without rales, wheezing or rhonchi  ABDOMEN: Soft, non-tender, non-distended MUSCULOSKELETAL:  No edema; No deformity  SKIN: Warm and dry LOWER EXTREMITIES: no swelling NEUROLOGIC:  Alert and oriented x 3 PSYCHIATRIC:  Normal affect   ASSESSMENT:    1. Coronary artery disease involving native coronary artery of native heart without angina pectoris   2. Ischemic cardiomyopathy   3. OBSTRUCTIVE SLEEP APNEA   4. Dyslipidemia (high LDL; low HDL)   5. Status post coronary artery bypass graft   6. Essential hypertension    PLAN:    In order of problems listed above:  1. Coronary artery disease: Denies having any typical symptoms he did have a stress test which shows small area of ischemia however because of asymptomatic we'll continue conservative approach. I will ask him to have EKG today. 2. Ischemic coronary myopathy: We'll continue monitoring, will continue medications. He does not want to change anything about his medication. I will get echocardiogram to assess left ventricle ejection fraction. 3. Dyslipidemia: He is able to tolerate only Crestor 5. However his cholesterol from primary care physician is acceptable. His LDL is 75. He does not want to increase his Crestor. 4. Essential hypertension: The well from that point review on appropriate medications will continue. 5. Coronary artery disease status post coronary artery bypass graft stable.   Medication Adjustments/Labs and Tests Ordered: Current medicines are reviewed at length with the patient today.  Concerns regarding medicines are outlined  above.  No orders of the defined types were placed in this encounter.  Medication changes:  Meds ordered this encounter  Medications  . nitroGLYCERIN (NITROSTAT) 0.4 MG SL tablet    Sig: Place 1 tablet (0.4 mg total) under the tongue every 5 (five) minutes as needed.    Dispense:  25 tablet    Refill:  1    Signed, Georgeanna Leaobert J. Krasowski, MD, [facility] 01/06/2017 10:47 AM    [facility]

## 2017-01-06 NOTE — Patient Instructions (Addendum)
Medication Instructions:  Your physician recommends that you continue on your current medications as directed. Please refer to the Current Medication list given to you today.  Labwork: None   Testing/Procedures: EKG today in the office   Your physician has requested that you have an echocardiogram. Echocardiography is a painless test that uses sound waves to create images of your heart. It provides your doctor with information about the size and shape of your heart and how well your heart's chambers and valves are working. This procedure takes approximately one hour. There are no restrictions for this procedure.   Follow-Up: Your physician wants you to follow-up in: 6 months. You will receive a reminder letter in the mail two months in advance. If you don't receive a letter, please call our office to schedule the follow-up appointment.  Any Other Special Instructions Will Be Listed Below (If Applicable).  Please note that any paperwork needing to be filled out by the provider will need to be addressed at the front desk prior to seeing the provider. Please note that any paperwork FMLA, Disability or other documents regarding health condition is subject to a $25.00 charge that must be received prior to completion of paperwork.    If you need a refill on your cardiac medications before your next appointment, please call your pharmacy.

## 2017-01-12 NOTE — Addendum Note (Signed)
Addended by: Rodney LangtonITTER, JADA M on: 01/12/2017 03:21 PM   Modules accepted: Orders

## 2017-01-15 ENCOUNTER — Ambulatory Visit (HOSPITAL_BASED_OUTPATIENT_CLINIC_OR_DEPARTMENT_OTHER)
Admission: RE | Admit: 2017-01-15 | Discharge: 2017-01-15 | Disposition: A | Discharge status: Home / Self Care | Payer: Medicare Other | Source: Ambulatory Visit | Attending: Cardiology | Admitting: Cardiology

## 2017-01-15 DIAGNOSIS — R0600 Dyspnea, unspecified: Secondary | ICD-10-CM | POA: Diagnosis not present

## 2017-01-15 DIAGNOSIS — I10 Essential (primary) hypertension: Secondary | ICD-10-CM | POA: Insufficient documentation

## 2017-01-15 DIAGNOSIS — Z8673 Personal history of transient ischemic attack (TIA), and cerebral infarction without residual deficits: Secondary | ICD-10-CM | POA: Insufficient documentation

## 2017-01-15 DIAGNOSIS — I2510 Atherosclerotic heart disease of native coronary artery without angina pectoris: Secondary | ICD-10-CM | POA: Insufficient documentation

## 2017-01-15 DIAGNOSIS — I255 Ischemic cardiomyopathy: Secondary | ICD-10-CM | POA: Insufficient documentation

## 2017-01-15 DIAGNOSIS — E785 Hyperlipidemia, unspecified: Secondary | ICD-10-CM | POA: Insufficient documentation

## 2017-01-15 NOTE — Progress Notes (Signed)
  Echocardiogram 2D Echocardiogram has been performed.  [name] T [name] 01/15/2017, 1:37 PM

## 2017-09-07 ENCOUNTER — Encounter: Payer: Self-pay | Admitting: Cardiology

## 2017-09-07 ENCOUNTER — Ambulatory Visit: Payer: Medicare Other | Admitting: Cardiology

## 2017-09-07 VITALS — BP 130/64 | HR 75 | Ht 70.0 in | Wt 194.8 lb

## 2017-09-07 DIAGNOSIS — Z951 Presence of aortocoronary bypass graft: Secondary | ICD-10-CM

## 2017-09-07 DIAGNOSIS — G250 Essential tremor: Secondary | ICD-10-CM | POA: Diagnosis not present

## 2017-09-07 DIAGNOSIS — G4733 Obstructive sleep apnea (adult) (pediatric): Secondary | ICD-10-CM

## 2017-09-07 DIAGNOSIS — I10 Essential (primary) hypertension: Secondary | ICD-10-CM | POA: Diagnosis not present

## 2017-09-07 DIAGNOSIS — I2510 Atherosclerotic heart disease of native coronary artery without angina pectoris: Secondary | ICD-10-CM

## 2017-09-07 HISTORY — DX: Essential tremor: G25.0

## 2017-09-07 MED ORDER — CARVEDILOL 12.5 MG PO TABS: 12.5000 mg | ORAL_TABLET | Freq: Two times a day (BID) | ORAL | 3 refills | Status: DC

## 2017-09-07 NOTE — Progress Notes (Signed)
Cardiology Office Note:    Date:  09/07/2017   ID:  [name], DOB [birth year], MRN [id number]  PCP:  [name], MD  Cardiologist:  [name], MD    Referring MD: [name], MD   Chief Complaint  Patient presents with  . Follow-up  Doing well  History of Present Illness:    [name] is a 77 y.o. male with coronary artery disease: Overall seems to be doing well.  Denies have any chest pain tightness squeezing pressure burning chest.  Still very active in the matter-of-fact yesterday he cut the grass for about 4 hours and had no difficulty doing it.  Few complaints that he had include a tremor which gradually slowly getting worse.  Apparently he was told that this is related to some nerve injury in the back however it is difficult for me to imagine the tremor can be caused by that.  Cardiac wise seems to be doing well.  Past Medical History:  Diagnosis Date  . Anxiety   . Arthritis    neck; limited neck movement  . Chronic rhinitis   . Coronary artery disease   . Depression    no treatment since he retired  . Dyspnea    with exertion  . GERD (gastroesophageal reflux disease)   . Headache    hx subdural hematoma  . Hyperlipidemia   . Hypertension   . Hypothyroidism   . OSA (obstructive sleep apnea)   . Stroke [facility]) ~69yrs ago   mild affects Lt eye  . Subdural hematoma (HCC) 2004    Past Surgical History:  Procedure Laterality Date  . APPENDECTOMY  1970s  . BRAIN HEMATOMA EVACUATION  ~2005   subdural hematoma  . CARPAL TUNNEL RELEASE Right 05/21/2016   Procedure: CARPAL TUNNEL RELEASE, right;  Surgeon: Cindee Salt, MD;  Location: [facility];  Service: Orthopedics;  Laterality: Right;  FAB  . CATARACT EXTRACTION Left   . CORONARY ARTERY BYPASS GRAFT  ~2006  . Heart bypass  2005  . ROTATOR CUFF REPAIR Right ~4 years    Current Medications: Current Meds  Medication Sig  . aspirin 81 MG tablet Take 81 mg by mouth  daily.    . carvedilol (COREG) 6.25 MG tablet Take 6.25 mg by mouth 2 (two) times daily.  . Cholecalciferol (VITAMIN D) 1000 UNITS capsule Take 1,000 Units by mouth daily.    . Cyanocobalamin (B-12) 2500 MCG TABS Take 1 tablet by mouth daily.  Marland Kitchen levothyroxine (SYNTHROID, LEVOTHROID) 75 MCG tablet Take 1 tablet by mouth daily.  . Misc Natural Products (PROSTATE HEALTH PO) Take 1 tablet by mouth daily.  . Misc Natural Products (TART CHERRY ADVANCED PO) Take 1,200 mg by mouth daily.  . montelukast (SINGULAIR) 10 MG tablet Take 10 mg by mouth at bedtime.  . Multiple Vitamin (MULTIVITAMIN) tablet Take 1 tablet by mouth daily.  . nitroGLYCERIN (NITROSTAT) 0.4 MG SL tablet Place 1 tablet (0.4 mg total) under the tongue every 5 (five) minutes as needed.  . Omega-3 Fatty Acids (FISH OIL) 1000 MG CAPS Take 1,500 mg by mouth 2 (two) times daily.  . Probiotic Product (PROBIOTIC ADVANCED PO) Take 1 capsule by mouth daily.  . rosuvastatin (CRESTOR) 5 MG tablet Take 1 tablet by mouth daily.  . tamsulosin (FLOMAX) 0.4 MG CAPS capsule Take 1 capsule by mouth daily.  . TURMERIC PO Take 1 tablet by mouth daily.     Allergies:   Patient has no known allergies.  Social History   Socioeconomic History  . Marital status: Married    Spouse name: [name]  . Number of children: 1  . Years of education: Not on file  . Highest education level: Not on file  Occupational History    Comment: [name]    Comment: [employer]  Social Needs  . Financial resource strain: Not on file  . Food insecurity:    Worry: Not on file    Inability: Not on file  . Transportation needs:    Medical: Not on file    Non-medical: Not on file  Tobacco Use  . Smoking status: Never Smoker  . Smokeless tobacco: Never Used  Substance and Sexual Activity  . Alcohol use: No  . Drug use: No  . Sexual activity: Not on file  Lifestyle  . Physical activity:    Days per week: Not on file    Minutes per session: Not on file    . Stress: Not on file  Relationships  . Social connections:    Talks on phone: Not on file    Gets together: Not on file    Attends religious service: Not on file    Active member of club or organization: Not on file    Attends meetings of clubs or organizations: Not on file    Relationship status: Not on file  Other Topics Concern  . 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 ROS:   Please see the history of present illness.    All 14 point review of systems negative except as described per history of present illness  EKGs/Labs/Other Studies Reviewed:      Recent Labs: No results found for requested labs within last 8760 hours.  Recent Lipid Panel No results found for: CHOL, TRIG, HDL, CHOLHDL, VLDL, LDLCALC, LDLDIRECT  Physical Exam:    VS:  BP 130/64   Pulse 75   Ht 5\' 10"  (1.778 m)   Wt 194 lb 12.8 oz (88.4 kg)   SpO2 97%   BMI 27.95 kg/m     Wt Readings from Last 3 Encounters:  09/07/17 194 lb 12.8 oz (88.4 kg)  01/06/17 195 lb 6.4 oz (88.6 kg)  05/21/16 186 lb (84.4 kg)     GEN:  Well nourished, well developed in no acute distress HEENT: Normal NECK: No JVD; No carotid bruits LYMPHATICS: No lymphadenopathy CARDIAC: RRR, no murmurs, no rubs, no gallops RESPIRATORY:  Clear to auscultation without rales, wheezing or rhonchi  ABDOMEN: Soft, non-tender, non-distended MUSCULOSKELETAL:  No edema; No deformity  SKIN: Warm and dry LOWER EXTREMITIES: no swelling NEUROLOGIC:  Alert and oriented x 3 PSYCHIATRIC:  Normal affect   ASSESSMENT:    1. Coronary artery disease involving native coronary artery of native heart without angina pectoris   2. Essential hypertension   3. OBSTRUCTIVE SLEEP APNEA   4. Status post coronary artery bypass graft   5. Essential  tremor    PLAN:    In order of problems listed above:  1. Coronary artery disease: Stable and appropriate medications which I will continue. 2. Essential hypertension: Blood pressure well controlled I will continue present medications. 3. Dyslipidemia: He brought his cholesterol profile which is excellent we will continue present management. 4. Essential tremor.  He  was talking to his primary care physician who recommended increasing dose of beta-blocker which is a good idea.  I will ask him to have EKG done today if he is not to bradycardic does have significant PR prolongation we will up the dose of carvedilol to 12.5 twice daily.  However, I told him if he started feeling poorly on higher dose of carvedilol he need to go back to previous dose. 5. I did review his echocardiogram from last visit which showed preserved left ventricular ejection fraction.  Will overall continue present management and see him back in the office in about 5 months. 6. Obstructive sleep apnea: He was diagnosed with this condition about 10 years ago he is some equipment at the beginning by because of frequent infection he stopped it.  Today he went to ophthalmologist for some eye injection he was fine to have some changes on his eye related to as described by ophthalmologist night hypoxia.  I told him that it will be absolutely essential for him to see his pulmonologist/sleep specialist as quickly as possible.  He said he will reconsider this.   Medication Adjustments/Labs and Tests Ordered: Current medicines are reviewed at length with the patient today.  Concerns regarding medicines are outlined above.  No orders of the defined types were placed in this encounter.  Medication changes: No orders of the defined types were placed in this encounter.   Signed, Georgeanna Leaobert J. Krasowski, MD, [facility] 09/07/2017 3:11 PM    [facility]

## 2017-09-07 NOTE — Patient Instructions (Signed)
Medication Instructions:  Your physician has recommended you make the following change in your medication: INCREASE carvedilol (coreg) 12.5 mg twice daily  Labwork: None  Testing/Procedures: You had an EKG today.  Follow-Up: Your physician wants you to follow-up in: 5 months. You will receive a reminder letter in the mail two months in advance. If you don't receive a letter, please call our office to schedule the follow-up appointment.   Any Other Special Instructions Will Be Listed Below (If Applicable).     If you need a refill on your cardiac medications before your next appointment, please call your pharmacy.

## 2017-09-10 ENCOUNTER — Telehealth: Payer: Self-pay | Admitting: Cardiology

## 2017-09-10 MED ORDER — CARVEDILOL 12.5 MG PO TABS: 12.5000 mg | ORAL_TABLET | Freq: Two times a day (BID) | ORAL | 3 refills | Status: DC

## 2017-09-10 NOTE — Telephone Encounter (Signed)
Please call [facility] for clarification on script

## 2017-09-10 NOTE — Telephone Encounter (Signed)
Called [facility] to verify that Dr. Bing MatterKrasowski increased carvedilol from 6.25 mg to 12.5 mg twice daily. No further questions.

## 2018-06-23 ENCOUNTER — Other Ambulatory Visit: Payer: Self-pay | Admitting: Cardiology

## 2018-07-12 ENCOUNTER — Ambulatory Visit: Payer: Medicare Other | Admitting: Cardiology

## 2018-07-15 ENCOUNTER — Ambulatory Visit (INDEPENDENT_AMBULATORY_CARE_PROVIDER_SITE_OTHER): Payer: Medicare Other | Admitting: Cardiology

## 2018-07-15 ENCOUNTER — Encounter: Payer: Self-pay | Admitting: Cardiology

## 2018-07-15 VITALS — BP 118/64 | HR 73 | Ht 70.0 in | Wt 195.2 lb

## 2018-07-15 DIAGNOSIS — G4733 Obstructive sleep apnea (adult) (pediatric): Secondary | ICD-10-CM

## 2018-07-15 DIAGNOSIS — I10 Essential (primary) hypertension: Secondary | ICD-10-CM | POA: Diagnosis not present

## 2018-07-15 DIAGNOSIS — I2510 Atherosclerotic heart disease of native coronary artery without angina pectoris: Secondary | ICD-10-CM | POA: Diagnosis not present

## 2018-07-15 DIAGNOSIS — I255 Ischemic cardiomyopathy: Secondary | ICD-10-CM

## 2018-07-15 DIAGNOSIS — E785 Hyperlipidemia, unspecified: Secondary | ICD-10-CM

## 2018-07-15 NOTE — Progress Notes (Signed)
Cardiology Office Note:    Date:  07/15/2018   ID:  [name], DOB 20-Mar-1941, MRN [id number]  PCP:  Ralph Kingdom, MD  Cardiologist:  Ralph Balsam, MD    Referring MD: Ralph Kingdom, MD   Chief Complaint  Patient presents with  . Follow-up  Doing fair  History of Present Illness:    [name] is a 78 y.o. male with coronary artery disease, hypertension, ischemic cardiomyopathy with last ejection fraction 2018 being normal.  Status post coronary bypass graft.  Comes today to my office for follow-up.  Has few complaints one is cough up anticoagulants if evaluation has been done for that he was treated for GERD however does not want to continue with this medication also Flonase was given thinking that that may be postnasal drip none seems to be helping.  CT of his chest has been done which did not show any acute findings some chronic calcification of coronary arteries noted.  He described to have some chest pain sometimes related to exercise sometimes happening at night.  He did say that this is completely different pain to compare to pain that he got before but still concerning.  Overall have a lot of issues that include some kidney dysfunction prostate problem eye issues so he is busy seeing all different doctors and somewhat unhappy about that.  Past Medical History:  Diagnosis Date  . Anxiety   . Arthritis    neck; limited neck movement  . Chronic rhinitis   . Coronary artery disease   . Depression    no treatment since he retired  . Dyspnea    with exertion  . GERD (gastroesophageal reflux disease)   . Headache    hx subdural hematoma  . Hyperlipidemia   . Hypertension   . Hypothyroidism   . OSA (obstructive sleep apnea)   . Stroke [facility]) ~16yrs ago   mild affects Lt eye  . Subdural hematoma (HCC) 2004    Past Surgical History:  Procedure Laterality Date  . APPENDECTOMY  1970s  . BRAIN HEMATOMA EVACUATION  ~2005   subdural hematoma  . CARPAL  TUNNEL RELEASE Right 05/21/2016   Procedure: CARPAL TUNNEL RELEASE, right;  Surgeon: Cindee Salt, MD;  Location: [facility];  Service: Orthopedics;  Laterality: Right;  FAB  . CATARACT EXTRACTION Left   . CORONARY ARTERY BYPASS GRAFT  ~2006  . Heart bypass  2005  . ROTATOR CUFF REPAIR Right ~4 years    Current Medications: Current Meds  Medication Sig  . aspirin 81 MG tablet Take 81 mg by mouth daily.    . carvedilol (COREG) 12.5 MG tablet Take 1 tablet (12.5 mg total) by mouth 2 (two) times daily. (Patient taking differently: Take 6.25 mg by mouth 2 (two) times daily. )  . Cholecalciferol (VITAMIN D) 1000 UNITS capsule Take 1,000 Units by mouth daily.    . Coenzyme Q10 (COQ10) 200 MG CAPS Take 1 capsule by mouth daily.  . Cyanocobalamin (B-12) 2500 MCG TABS Take 1 tablet by mouth daily.  Marland Kitchen levothyroxine (SYNTHROID, LEVOTHROID) 75 MCG tablet Take 1 tablet by mouth daily.  . Misc Natural Products (PROSTATE HEALTH PO) Take 1 tablet by mouth daily.  . montelukast (SINGULAIR) 10 MG tablet Take 10 mg by mouth at bedtime.  . Multiple Vitamin (MULTIVITAMIN) tablet Take 1 tablet by mouth daily.  . nitroGLYCERIN (NITROSTAT) 0.4 MG SL tablet Place 1 tablet under the tongue every 5 minutes as needed.  . Omega-3 Fatty  Acids (FISH OIL) 1000 MG CAPS Take 1,500 mg by mouth 2 (two) times daily.  . Probiotic Product (PROBIOTIC ADVANCED PO) Take 1 capsule by mouth daily.  . rosuvastatin (CRESTOR) 5 MG tablet Take 1 tablet by mouth daily.  . tamsulosin (FLOMAX) 0.4 MG CAPS capsule Take 1 capsule by mouth daily.     Allergies:   Patient has no known allergies.   Social History   Socioeconomic History  . Marital status: Married    Spouse name: [name]  . Number of children: 1  . Years of education: Not on file  . Highest education level: Not on file  Occupational History    Comment: [name]    Comment: [employer]  Social Needs  . Financial resource strain: Not on file  .  Food insecurity:    Worry: Not on file    Inability: Not on file  . Transportation needs:    Medical: Not on file    Non-medical: Not on file  Tobacco Use  . Smoking status: Never Smoker  . Smokeless tobacco: Never Used  Substance and Sexual Activity  . Alcohol use: No  . Drug use: No  . Sexual activity: Not on file  Lifestyle  . Physical activity:    Days per week: Not on file    Minutes per session: Not on file  . Stress: Not on file  Relationships  . Social connections:    Talks on phone: Not on file    Gets together: Not on file    Attends religious service: Not on file    Active member of club or organization: Not on file    Attends meetings of clubs or organizations: Not on file    Relationship status: Not on file  Other Topics Concern  . 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 ROS:   Please see the history of present illness.    All 14 point review of systems negative except as described per history of present illness  EKGs/Labs/Other Studies Reviewed:      Recent Labs: No results found for requested labs within last 8760 hours.  Recent Lipid Panel No results found for: CHOL, TRIG, HDL, CHOLHDL, VLDL, LDLCALC, LDLDIRECT  Physical Exam:    VS:  BP 118/64   Pulse 73   Ht 5\' 10"  (1.778 m)   Wt 195 lb 3.2 oz (88.5 kg)   SpO2 94%   BMI 28.01 kg/m     Wt Readings from Last 3 Encounters:  07/15/18 195 lb 3.2 oz (88.5 kg)  09/07/17 194 lb 12.8 oz (88.4 kg)  01/06/17 195 lb 6.4 oz (88.6 kg)     GEN:  Well nourished, well developed in no acute distress HEENT: Normal NECK: No JVD; No carotid bruits LYMPHATICS: No lymphadenopathy CARDIAC: RRR, no murmurs, no rubs, no gallops RESPIRATORY:  Clear to auscultation without rales, wheezing or rhonchi  ABDOMEN:  Soft, non-tender, non-distended MUSCULOSKELETAL:  No edema; No deformity  SKIN: Warm and dry LOWER EXTREMITIES: no swelling NEUROLOGIC:  Alert and oriented x 3 PSYCHIATRIC:  Normal affect   ASSESSMENT:    1. Ischemic cardiomyopathy   2. Coronary artery disease involving native coronary artery of native heart without angina pectoris   3. Essential hypertension   4.  OBSTRUCTIVE SLEEP APNEA   5. Dyslipidemia (high LDL; low HDL)    PLAN:    In order of problems listed above:  1. Coronary artery disease does have some symptoms that are worrisome I will schedule him to have Lexiscan echocardiogram will be done as well to assess left ventricular ejection fraction.  We will not alter any of his medication for the moment. 2. Dyslipidemia last fasting lipid profile done to have is excellent he is on Crestor which I will continue. 3. Essential hypertension blood pressure well controlled we will continue present management. 4. Obstructive sleep apnea using oxygen at night.  Seems to be satisfied with that  5. Essential tremor last time we try to increase dose of beta-blocker to see if it helps with the tremor however it did not help him much therefore he moved back to the previous dose of Coreg.   Medication Adjustments/Labs and Tests Ordered: Current medicines are reviewed at length with the patient today.  Concerns regarding medicines are outlined above.  No orders of the defined types were placed in this encounter.  Medication changes: No orders of the defined types were placed in this encounter.   Signed, Georgeanna Leaobert J. , MD, [facility] 07/15/2018 8:31 AM    [facility]

## 2018-07-15 NOTE — Patient Instructions (Signed)
Medication Instructions:  Your physician recommends that you continue on your current medications as directed. Please refer to the Current Medication list given to you today.  If you need a refill on your cardiac medications before your next appointment, please call your pharmacy.   Lab work: None ordered If you have labs (blood work) drawn today and your tests are completely normal, you will receive your results only by: Marland Kitchen MyChart Message (if you have MyChart) OR . A paper copy in the mail If you have any lab test that is abnormal or we need to change your treatment, we will call you to review the results.  Testing/Procedures: Your physician has requested that you have an echocardiogram. Echocardiography is a painless test that uses sound waves to create images of your heart. It provides your doctor with information about the size and shape of your heart and how well your heart's chambers and valves are working. This procedure takes approximately one hour. There are no restrictions for this procedure.  Your physician has requested that you have a lexiscan myoview. For further information please visit [email or web address] Please follow instruction sheet, as given.  Follow-Up: At [facility], you and your health needs are our priority.  As part of our continuing mission to provide you with exceptional heart care, we have created designated Provider Care Teams.  These Care Teams include your primary Cardiologist (physician) and Advanced Practice Providers (APPs -  Physician Assistants and Nurse Practitioners) who all work together to provide you with the care you need, when you need it. You will need a follow up appointment in 2 months.  You may see [name] or another member of our [employer] Provider Team in [place]: Norman Herrlich, MD . Belva Crome, MD

## 2018-08-24 ENCOUNTER — Other Ambulatory Visit: Payer: Medicare Other

## 2018-08-30 ENCOUNTER — Other Ambulatory Visit: Payer: Self-pay | Admitting: Nephrology

## 2018-08-30 DIAGNOSIS — I129 Hypertensive chronic kidney disease with stage 1 through stage 4 chronic kidney disease, or unspecified chronic kidney disease: Secondary | ICD-10-CM

## 2018-08-30 DIAGNOSIS — N183 Chronic kidney disease, stage 3 unspecified: Secondary | ICD-10-CM

## 2018-09-14 ENCOUNTER — Telehealth: Payer: Medicare Other | Admitting: Cardiology

## 2018-09-16 ENCOUNTER — Other Ambulatory Visit: Payer: Self-pay | Admitting: Cardiology

## 2018-10-20 ENCOUNTER — Other Ambulatory Visit: Payer: Medicare Other

## 2018-11-02 ENCOUNTER — Ambulatory Visit
Admission: RE | Admit: 2018-11-02 | Discharge: 2018-11-02 | Disposition: A | Discharge status: Home / Self Care | Payer: Medicare Other | Source: Ambulatory Visit | Attending: Nephrology | Admitting: Nephrology

## 2018-11-02 DIAGNOSIS — N183 Chronic kidney disease, stage 3 unspecified: Secondary | ICD-10-CM

## 2018-11-02 DIAGNOSIS — I129 Hypertensive chronic kidney disease with stage 1 through stage 4 chronic kidney disease, or unspecified chronic kidney disease: Secondary | ICD-10-CM

## 2018-12-07 ENCOUNTER — Telehealth (HOSPITAL_COMMUNITY): Payer: Self-pay | Admitting: *Deleted

## 2018-12-07 NOTE — Telephone Encounter (Signed)
[name] per DPR given detailed instructions per Myocardial Perfusion Study Information Sheet for the test on 12/14/18 at 8:00. Patient notified to arrive 15 minutes early and that it is imperative to arrive on time for appointment to keep from having the test rescheduled.  If you need to cancel or reschedule your appointment, please call the office within 24 hours of your appointment. . Patient verbalized understanding.[name]

## 2018-12-08 ENCOUNTER — Ambulatory Visit (INDEPENDENT_AMBULATORY_CARE_PROVIDER_SITE_OTHER): Payer: Medicare Other

## 2018-12-08 ENCOUNTER — Other Ambulatory Visit: Payer: Self-pay

## 2018-12-08 DIAGNOSIS — I255 Ischemic cardiomyopathy: Secondary | ICD-10-CM

## 2018-12-13 ENCOUNTER — Telehealth: Payer: Self-pay | Admitting: *Deleted

## 2018-12-13 NOTE — Telephone Encounter (Signed)
-----   Message from Park Liter, MD sent at 12/11/2018  5:34 PM EDT ----- Echo showed mildly diminished LVEf 45%  Check chem 7 so we can start ARB

## 2018-12-13 NOTE — Telephone Encounter (Signed)
Telephone call to patient Left message to call back regarding echo results 

## 2018-12-14 ENCOUNTER — Other Ambulatory Visit: Payer: Self-pay

## 2018-12-14 ENCOUNTER — Other Ambulatory Visit: Payer: Self-pay | Admitting: Cardiology

## 2018-12-14 ENCOUNTER — Ambulatory Visit (INDEPENDENT_AMBULATORY_CARE_PROVIDER_SITE_OTHER): Payer: Medicare Other

## 2018-12-14 DIAGNOSIS — R079 Chest pain, unspecified: Secondary | ICD-10-CM | POA: Diagnosis not present

## 2018-12-14 LAB — MYOCARDIAL PERFUSION IMAGING
LV dias vol: 75 mL (ref 62–150)
LV sys vol: 33 mL
Peak HR: 87 {beats}/min
Rest HR: 61 {beats}/min
SDS: 5
SRS: 2
SSS: 7
TID: 1.16

## 2018-12-14 MED ORDER — REGADENOSON 0.4 MG/5ML IV SOLN
0.4000 mg | Freq: Once | INTRAVENOUS | Status: AC
  Administered 2018-12-14: 0.4 mg via INTRAVENOUS

## 2018-12-14 MED ORDER — TECHNETIUM TC 99M TETROFOSMIN IV KIT
31.2000 | PACK | Freq: Once | INTRAVENOUS | Status: AC | PRN
  Administered 2018-12-14: 31.2 via INTRAVENOUS

## 2018-12-14 MED ORDER — TECHNETIUM TC 99M TETROFOSMIN IV KIT
10.5000 | PACK | Freq: Once | INTRAVENOUS | Status: AC | PRN
  Administered 2018-12-14: 10.5 via INTRAVENOUS

## 2018-12-16 ENCOUNTER — Telehealth: Payer: Self-pay | Admitting: Emergency Medicine

## 2018-12-16 NOTE — Telephone Encounter (Signed)
Left message for patient to return call for results.

## 2018-12-19 ENCOUNTER — Other Ambulatory Visit: Payer: Self-pay | Admitting: Emergency Medicine

## 2018-12-19 ENCOUNTER — Telehealth: Payer: Self-pay | Admitting: Cardiology

## 2018-12-19 DIAGNOSIS — I255 Ischemic cardiomyopathy: Secondary | ICD-10-CM

## 2018-12-19 DIAGNOSIS — I10 Essential (primary) hypertension: Secondary | ICD-10-CM

## 2018-12-19 NOTE — Telephone Encounter (Signed)
Returning call about labs

## 2018-12-19 NOTE — Telephone Encounter (Signed)
Called wife back and informed her of results.

## 2018-12-21 LAB — BASIC METABOLIC PANEL
BUN/Creatinine Ratio: 11 (ref 10–24)
BUN: 18 mg/dL (ref 8–27)
CO2: 19 mmol/L — ABNORMAL LOW (ref 20–29)
Calcium: 9.5 mg/dL (ref 8.6–10.2)
Chloride: 105 mmol/L (ref 96–106)
Creatinine, Ser: 1.68 mg/dL — ABNORMAL HIGH (ref 0.76–1.27)
GFR calc Af Amer: 44 mL/min/{1.73_m2} — ABNORMAL LOW (ref >59)
GFR calc non Af Amer: 38 mL/min/{1.73_m2} — ABNORMAL LOW (ref >59)
Glucose: 149 mg/dL — ABNORMAL HIGH (ref 65–99)
Potassium: 4.2 mmol/L (ref 3.5–5.2)
Sodium: 142 mmol/L (ref 134–144)

## 2018-12-23 ENCOUNTER — Other Ambulatory Visit: Payer: Self-pay

## 2018-12-23 ENCOUNTER — Ambulatory Visit (INDEPENDENT_AMBULATORY_CARE_PROVIDER_SITE_OTHER): Payer: Medicare Other | Admitting: Cardiology

## 2018-12-23 ENCOUNTER — Telehealth: Payer: Self-pay | Admitting: Emergency Medicine

## 2018-12-23 ENCOUNTER — Encounter: Payer: Self-pay | Admitting: Cardiology

## 2018-12-23 VITALS — BP 124/76 | HR 71 | Ht 70.0 in | Wt 196.8 lb

## 2018-12-23 DIAGNOSIS — I2510 Atherosclerotic heart disease of native coronary artery without angina pectoris: Secondary | ICD-10-CM

## 2018-12-23 DIAGNOSIS — Z951 Presence of aortocoronary bypass graft: Secondary | ICD-10-CM | POA: Diagnosis not present

## 2018-12-23 DIAGNOSIS — I255 Ischemic cardiomyopathy: Secondary | ICD-10-CM

## 2018-12-23 DIAGNOSIS — I10 Essential (primary) hypertension: Secondary | ICD-10-CM

## 2018-12-23 MED ORDER — ISOSORBIDE MONONITRATE ER 30 MG PO TB24: 30.0000 mg | ORAL_TABLET | Freq: Every day | ORAL | 0 refills | Status: DC

## 2018-12-23 NOTE — Progress Notes (Signed)
Cardiology Office Note:    Date:  12/23/2018   ID:  [name], DOB 12/03/1940, MRN [id number]  PCP:  Philemon KingdomProchnau, Caroline, MD  Cardiologist:  Gypsy Balsamobert , MD    Referring MD: Philemon KingdomProchnau, Caroline, MD   Chief Complaint  Patient presents with  . Follow up on Stress test  I am doing well  History of Present Illness:    [name] is a 78 y.o. male with past medical history significant for coronary artery disease, status post coronary artery bypass graft years ago, also history of cardiomyopathy.  Recently he had a stress test which showed ischemia involving inferior wall.  Also echocardiogram showed diminished left ventricular ejection fraction 40 to 45%.  He comes today to talk about that.  Overall he seems to be doing well he is able to cut grass in a 90+ degree weather with no difficulties.  Denies having any chest pain tightness squeezing pressure been chest but complain of having some fatigue and tiredness.  Past Medical History:  Diagnosis Date  . Anxiety   . Arthritis    neck; limited neck movement  . Chronic rhinitis   . Coronary artery disease   . Depression    no treatment since he retired  . Dyspnea    with exertion  . GERD (gastroesophageal reflux disease)   . Headache    hx subdural hematoma  . Hyperlipidemia   . Hypertension   . Hypothyroidism   . OSA (obstructive sleep apnea)   . Stroke [facility](HCC) ~4159yrs ago   mild affects Lt eye  . Subdural hematoma (HCC) 2004    Past Surgical History:  Procedure Laterality Date  . APPENDECTOMY  1970s  . BRAIN HEMATOMA EVACUATION  ~2005   subdural hematoma  . CARPAL TUNNEL RELEASE Right 05/21/2016   Procedure: CARPAL TUNNEL RELEASE, right;  Surgeon: Cindee SaltGary Kuzma, MD;  Location: [facility];  Service: Orthopedics;  Laterality: Right;  FAB  . CATARACT EXTRACTION Left   . CORONARY ARTERY BYPASS GRAFT  ~2006  . Heart bypass  2005  . ROTATOR CUFF REPAIR Right ~4 years    Current Medications: Current  Meds  Medication Sig  . aspirin 81 MG tablet Take 81 mg by mouth daily.    . carvedilol (COREG) 12.5 MG tablet Take 1 tablet (12.5 mg total) by mouth 2 (two) times daily. (Patient taking differently: Take 6.25 mg by mouth 2 (two) times daily. )  . Cholecalciferol (VITAMIN D) 1000 UNITS capsule Take 1,000 Units by mouth daily.    . Coenzyme Q10 (COQ10) 100 MG CAPS Take 3 capsules by mouth daily.   . Cyanocobalamin (B-12) 2500 MCG TABS Take 1 tablet by mouth daily.  Marland Kitchen. levothyroxine (SYNTHROID, LEVOTHROID) 75 MCG tablet Take 1 tablet by mouth daily.  . Misc Natural Products (PROSTATE HEALTH PO) Take 1 tablet by mouth daily.  . montelukast (SINGULAIR) 10 MG tablet Take 10 mg by mouth at bedtime.  . Multiple Vitamin (MULTIVITAMIN) tablet Take 1 tablet by mouth daily.  . nitroGLYCERIN (NITROSTAT) 0.4 MG SL tablet Place 1 tablet under the tongue every 5 minutes as needed.  . Omega-3 Fatty Acids (FISH OIL) 1000 MG CAPS Take 1,500 mg by mouth 2 (two) times daily.  . Probiotic Product (PROBIOTIC ADVANCED PO) Take 1 capsule by mouth daily.  . rosuvastatin (CRESTOR) 5 MG tablet Take 1 tablet by mouth daily.  . tamsulosin (FLOMAX) 0.4 MG CAPS capsule Take 0.8 mg by mouth daily.  Allergies:   Patient has no known allergies.   Social History   Socioeconomic History  . Marital status: Married    Spouse name: [name]  . Number of children: 1  . Years of education: Not on file  . Highest education level: Not on file  Occupational History    Comment: Jimmye NormanFarmer    Comment: [employer]  Social Needs  . Financial resource strain: Not on file  . Food insecurity    Worry: Not on file    Inability: Not on file  . Transportation needs    Medical: Not on file    Non-medical: Not on file  Tobacco Use  . Smoking status: Never Smoker  . Smokeless tobacco: Never Used  Substance and Sexual Activity  . Alcohol use: No  . Drug use: No  . Sexual activity: Not on file  Lifestyle  . Physical  activity    Days per week: Not on file    Minutes per session: Not on file  . Stress: Not on file  Relationships  . Social Musicianconnections    Talks on phone: Not on file    Gets together: Not on file    Attends religious service: Not on file    Active member of club or organization: Not on file    Attends meetings of clubs or organizations: Not on file    Relationship status: Not on file  Other Topics Concern  . 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 ROS:   Please see the history of present illness.    All 14 point review of systems negative except as described per history of present illness  EKGs/Labs/Other Studies Reviewed:      Recent Labs: 12/20/2018: BUN 18; Creatinine, Ser 1.68; Potassium 4.2; Sodium 142  Recent Lipid Panel No results found for: CHOL, TRIG, HDL, CHOLHDL, VLDL, LDLCALC, LDLDIRECT  Physical Exam:    VS:  BP 124/76   Pulse 71   Ht 5\' 10"  (1.778 m)   Wt 196 lb 12.8 oz (89.3 kg)   SpO2 98%   BMI 28.24 kg/m     Wt Readings from Last 3 Encounters:  12/23/18 196 lb 12.8 oz (89.3 kg)  12/14/18 195 lb (88.5 kg)  07/15/18 195 lb 3.2 oz (88.5 kg)     GEN:  Well nourished, well developed in no acute distress HEENT: Normal NECK: No JVD; No carotid bruits LYMPHATICS: No lymphadenopathy CARDIAC: RRR, no murmurs, no rubs, no gallops RESPIRATORY:  Clear to auscultation without rales, wheezing or rhonchi  ABDOMEN: Soft, non-tender, non-distended MUSCULOSKELETAL:  No edema; No deformity  SKIN: Warm and dry LOWER EXTREMITIES: no swelling NEUROLOGIC:  Alert and oriented x 3 PSYCHIATRIC:  Normal affect   ASSESSMENT:    1. Coronary artery disease involving native coronary artery of native heart without angina pectoris   2. Status post coronary artery bypass  graft   3. Essential hypertension   4. Ischemic cardiomyopathy    PLAN:    In order of problems listed above:  1. Coronary artery disease with recent stresses being abnormal showing ischemia involving inferior wall.  We talked about option in this situation since he does not have many symptoms he preferred to continue medical management.  Therefore I will ask him  to start taking Imdur 30 mg daily.  I want him about potentially having headache.  I asked him to take Tylenol if he develop headache. 2. Status post coronary artery bypass graft.  Noted, plan as outlined above 3. Essential hypertension blood pressure well controlled continue present management. 4. Ischemic cardiomyopathy ejection fraction 40 to 45% based on last echocardiogram.  He does have some baseline kidney dysfunction with a low GFR therefore ACE inhibitor/ARB is probably out of the question.  However we can start vasodilatation.  Therefore, Imdur should be a good option for him.   Medication Adjustments/Labs and Tests Ordered: Current medicines are reviewed at length with the patient today.  Concerns regarding medicines are outlined above.  No orders of the defined types were placed in this encounter.  Medication changes: No orders of the defined types were placed in this encounter.   Signed, Park Liter, MD, [facility] 12/23/2018 9:02 AM    [place]

## 2018-12-23 NOTE — Telephone Encounter (Signed)
Called wife. Informed her of lab results and that the patient needs to return in 10 days for repeat labs.

## 2018-12-23 NOTE — Patient Instructions (Signed)
Medication Instructions:  Your physician has recommended you make the following change in your medication:   Start: Imdur 30 mg daily   If you need a refill on your cardiac medications before your next appointment, please call your pharmacy.   Lab work: None.  If you have labs (blood work) drawn today and your tests are completely normal, you will receive your results only by: Marland Kitchen MyChart Message (if you have MyChart) OR . A paper copy in the mail If you have any lab test that is abnormal or we need to change your treatment, we will call you to review the results.  Testing/Procedures: None.   Follow-Up: At [facility], you and your health needs are our priority.  As part of our continuing mission to provide you with exceptional heart care, we have created designated Provider Care Teams.  These Care Teams include your primary Cardiologist (physician) and Advanced Practice Providers (APPs -  Physician Assistants and Nurse Practitioners) who all work together to provide you with the care you need, when you need it. You will need a follow up appointment in 3 months.  Please call our office 2 months in advance to schedule this appointment.  You may see No primary care provider on file. or another member of our [employer] Provider Team in [place]: Shirlee More, MD . Jyl Heinz, MD  Any Other Special Instructions Will Be Listed Below (If Applicable).  Isosorbide Mononitrate extended-release tablets What is this medicine? ISOSORBIDE MONONITRATE (eye soe SOR bide mon oh NYE trate) is a vasodilator. It relaxes blood vessels, increasing the blood and oxygen supply to your heart. This medicine is used to prevent chest pain caused by angina. It will not help to stop an episode of chest pain. This medicine may be used for other purposes; ask your health care provider or pharmacist if you have questions. COMMON BRAND NAME(S): Imdur, Isotrate ER What should I tell my health care provider  before I take this medicine? They need to know if you have any of these conditions:  previous heart attack or heart failure  an unusual or allergic reaction to isosorbide mononitrate, nitrates, other medicines, foods, dyes, or preservatives  pregnant or trying to get pregnant  breast-feeding How should I use this medicine? Take this medicine by mouth with a glass of water. Follow the directions on the prescription label. Do not crush or chew. Take your medicine at regular intervals. Do not take your medicine more often than directed. Do not stop taking this medicine except on the advice of your doctor or health care professional. Talk to your pediatrician regarding the use of this medicine in children. Special care may be needed. Overdosage: If you think you have taken too much of this medicine contact a poison control center or emergency room at once. NOTE: This medicine is only for you. Do not share this medicine with others. What if I miss a dose? If you miss a dose, take it as soon as you can. If it is almost time for your next dose, take only that dose. Do not take double or extra doses. What may interact with this medicine? Do not take this medicine with any of the following medications:  medicines used to treat erectile dysfunction (ED) like avanafil, sildenafil, tadalafil, and vardenafil  riociguat This medicine may also interact with the following medications:  medicines for high blood pressure  other medicines for angina or heart failure This list may not describe all possible interactions. Give your  health care provider a list of all the medicines, herbs, non-prescription drugs, or dietary supplements you use. Also tell them if you smoke, drink alcohol, or use illegal drugs. Some items may interact with your medicine. What should I watch for while using this medicine? Check your heart rate and blood pressure regularly while you are taking this medicine. Ask your doctor or  health care professional what your heart rate and blood pressure should be and when you should contact him or her. Tell your doctor or health care professional if you feel your medicine is no longer working. You may get dizzy. Do not drive, use machinery, or do anything that needs mental alertness until you know how this medicine affects you. To reduce the risk of dizzy or fainting spells, do not sit or stand up quickly, especially if you are an older patient. Alcohol can make you more dizzy, and increase flushing and rapid heartbeats. Avoid alcoholic drinks. Do not treat yourself for coughs, colds, or pain while you are taking this medicine without asking your doctor or health care professional for advice. Some ingredients may increase your blood pressure. What side effects may I notice from receiving this medicine? Side effects that you should report to your doctor or health care professional as soon as possible:  bluish discoloration of lips, fingernails, or palms of hands  irregular heartbeat, palpitations  low blood pressure  nausea, vomiting  persistent headache  unusually weak or tired Side effects that usually do not require medical attention (report to your doctor or health care professional if they continue or are bothersome):  flushing of the face or neck  rash This list may not describe all possible side effects. Call your doctor for medical advice about side effects. You may report side effects to FDA at 1-800-FDA-1088. Where should I keep my medicine? Keep out of the reach of children. Store between 15 and 30 degrees C (59 and 86 degrees F). Keep container tightly closed. Throw away any unused medicine after the expiration date. NOTE: This sheet is a summary. It may not cover all possible information. If you have questions about this medicine, talk to your doctor, pharmacist, or health care provider.  2020 Elsevier/Gold Standard (2013-03-17 14:48:19)

## 2018-12-30 LAB — BASIC METABOLIC PANEL
BUN/Creatinine Ratio: 12 (ref 10–24)
BUN: 20 mg/dL (ref 8–27)
CO2: 21 mmol/L (ref 20–29)
Calcium: 9.3 mg/dL (ref 8.6–10.2)
Chloride: 105 mmol/L (ref 96–106)
Creatinine, Ser: 1.67 mg/dL — ABNORMAL HIGH (ref 0.76–1.27)
GFR calc Af Amer: 45 mL/min/{1.73_m2} — ABNORMAL LOW (ref >59)
GFR calc non Af Amer: 39 mL/min/{1.73_m2} — ABNORMAL LOW (ref >59)
Glucose: 99 mg/dL (ref 65–99)
Potassium: 4.8 mmol/L (ref 3.5–5.2)
Sodium: 142 mmol/L (ref 134–144)

## 2019-02-16 ENCOUNTER — Ambulatory Visit (INDEPENDENT_AMBULATORY_CARE_PROVIDER_SITE_OTHER): Payer: Medicare Other | Admitting: Cardiology

## 2019-02-16 ENCOUNTER — Encounter: Payer: Self-pay | Admitting: Cardiology

## 2019-02-16 ENCOUNTER — Other Ambulatory Visit: Payer: Self-pay

## 2019-02-16 VITALS — BP 120/64 | HR 73 | Ht 70.0 in | Wt 194.0 lb

## 2019-02-16 DIAGNOSIS — I10 Essential (primary) hypertension: Secondary | ICD-10-CM | POA: Diagnosis not present

## 2019-02-16 DIAGNOSIS — E785 Hyperlipidemia, unspecified: Secondary | ICD-10-CM | POA: Diagnosis not present

## 2019-02-16 DIAGNOSIS — I255 Ischemic cardiomyopathy: Secondary | ICD-10-CM | POA: Diagnosis not present

## 2019-02-16 DIAGNOSIS — Z951 Presence of aortocoronary bypass graft: Secondary | ICD-10-CM | POA: Diagnosis not present

## 2019-02-16 NOTE — Addendum Note (Signed)
Addended by: Ashok Norris on: 02/16/2019 02:47 PM   Modules accepted: Orders

## 2019-02-16 NOTE — Patient Instructions (Signed)
Medication Instructions:  Your physician has recommended you make the following change in your medication:  STOP: IMDUR   If you need a refill on your cardiac medications before your next appointment, please call your pharmacy.   Lab work: None.  If you have labs (blood work) drawn today and your tests are completely normal, you will receive your results only by: Marland Kitchen MyChart Message (if you have MyChart) OR . A paper copy in the mail If you have any lab test that is abnormal or we need to change your treatment, we will call you to review the results.  Testing/Procedures: None.    Follow-Up: At [facility], you and your health needs are our priority.  As part of our continuing mission to provide you with exceptional heart care, we have created designated Provider Care Teams.  These Care Teams include your primary Cardiologist (physician) and Advanced Practice Providers (APPs -  Physician Assistants and Nurse Practitioners) who all work together to provide you with the care you need, when you need it. You will need a follow up appointment in 2 months.  Please call our office 2 months in advance to schedule this appointment.  You may see No primary care provider on file. or another member of our [employer] Provider Team in [place]: Shirlee More, MD . Jyl Heinz, MD  Any Other Special Instructions Will Be Listed Below (If Applicable).

## 2019-02-16 NOTE — Progress Notes (Signed)
Cardiology Office Note:    Date:  02/16/2019   ID:  [name], DOB [birth year], MRN [id number]  PCP:  Ernestene Kiel, MD  Cardiologist:  Jenne Campus, MD    Referring MD: Ernestene Kiel, MD   Chief Complaint  Patient presents with  . Follow-up  I am feeling drunk  History of Present Illness:    [name] is a 78 y.o. male with coronary artery disease, status post coronary artery bypass graft.  Recently he had a stress test done which showed ischemia involving inferior wall.  Because of lack of typical symptoms as well as baseline kidney dysfunction we elected to manage this medically, I gave him prescription for Imdur 30 mg daily he took it and he feels weak tired exhausted and drank during the day he have to rest multiple times during the day simply not feeling well the idea of being being him this medication was to help with potential angina equivalent as well as hopefully prove the function of his heart obviously he cannot tolerate this.  He is a poor candidate for ACE inhibitor or ARB because of baseline kidney dysfunction.  He is scheduled to see kidney specialist within next 2 weeks they may decided to put him on ACE inhibitor which could be beneficial from cardiac standpoint review as well.  For now I will simply discontinue his Imdur and see how he will do.  Past Medical History:  Diagnosis Date  . Anxiety   . Arthritis    neck; limited neck movement  . Chronic rhinitis   . Coronary artery disease   . Depression    no treatment since he retired  . Dyspnea    with exertion  . GERD (gastroesophageal reflux disease)   . Headache    hx subdural hematoma  . Hyperlipidemia   . Hypertension   . Hypothyroidism   . OSA (obstructive sleep apnea)   . Stroke [facility]) ~88yrs ago   mild affects Lt eye  . Subdural hematoma ([place]) 2004    Past Surgical History:  Procedure Laterality Date  . APPENDECTOMY  1970s  . BRAIN HEMATOMA EVACUATION  ~2005   subdural  hematoma  . CARPAL TUNNEL RELEASE Right 05/21/2016   Procedure: CARPAL TUNNEL RELEASE, right;  Surgeon: Daryll Brod, MD;  Location: [place];  Service: Orthopedics;  Laterality: Right;  FAB  . CATARACT EXTRACTION Left   . CORONARY ARTERY BYPASS GRAFT  ~2006  . Heart bypass  2005  . ROTATOR CUFF REPAIR Right ~4 years    Current Medications: Current Meds  Medication Sig  . aspirin 81 MG tablet Take 81 mg by mouth daily.    . carvedilol (COREG) 12.5 MG tablet Take 1 tablet (12.5 mg total) by mouth 2 (two) times daily. (Patient taking differently: Take 6.25 mg by mouth 2 (two) times daily. )  . Cholecalciferol (VITAMIN D) 1000 UNITS capsule Take 1,000 Units by mouth daily.    . Coenzyme Q10 (COQ10) 100 MG CAPS Take 3 capsules by mouth daily.   . Cyanocobalamin (B-12) 2500 MCG TABS Take 1 tablet by mouth daily.  . isosorbide mononitrate (IMDUR) 30 MG 24 hr tablet Take 1 tablet (30 mg total) by mouth daily.  Marland Kitchen levothyroxine (SYNTHROID, LEVOTHROID) 75 MCG tablet Take 1 tablet by mouth daily.  . Misc Natural Products (PROSTATE HEALTH PO) Take 1 tablet by mouth daily.  . montelukast (SINGULAIR) 10 MG tablet Take 10 mg by mouth at bedtime.  . Multiple Vitamin (  MULTIVITAMIN) tablet Take 1 tablet by mouth daily.  . nitroGLYCERIN (NITROSTAT) 0.4 MG SL tablet Place 1 tablet under the tongue every 5 minutes as needed.  . Omega-3 Fatty Acids (FISH OIL) 1000 MG CAPS Take 1,500 mg by mouth 2 (two) times daily.  . Probiotic Product (PROBIOTIC ADVANCED PO) Take 1 capsule by mouth daily.  . rosuvastatin (CRESTOR) 5 MG tablet Take 1 tablet by mouth daily.  . tamsulosin (FLOMAX) 0.4 MG CAPS capsule Take 0.8 mg by mouth daily.      Allergies:   Patient has no known allergies.   Social History   Socioeconomic History  . Marital status: Married    Spouse name: Bonita QuinLinda  . Number of children: 1  . Years of education: Not on file  . Highest education level: Not on file  Occupational History     Comment: Jimmye NormanFarmer    Comment: [employer]  Social Needs  . Financial resource strain: Not on file  . Food insecurity    Worry: Not on file    Inability: Not on file  . Transportation needs    Medical: Not on file    Non-medical: Not on file  Tobacco Use  . Smoking status: Never Smoker  . Smokeless tobacco: Never Used  Substance and Sexual Activity  . Alcohol use: No  . Drug use: No  . Sexual activity: Not on file  Lifestyle  . Physical activity    Days per week: Not on file    Minutes per session: Not on file  . Stress: Not on file  Relationships  . Social Musicianconnections    Talks on phone: Not on file    Gets together: Not on file    Attends religious service: Not on file    Active member of club or organization: Not on file    Attends meetings of clubs or organizations: Not on file    Relationship status: Not on file  Other Topics Concern  . 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 ROS:   Please see the history of present illness.    All 14 point review of systems negative except as described per history of present illness  EKGs/Labs/Other Studies Reviewed:      Recent Labs: 12/29/2018: BUN 20; Creatinine, Ser 1.67; Potassium 4.8; Sodium 142  Recent Lipid Panel No results found for: CHOL, TRIG, HDL, CHOLHDL, VLDL, LDLCALC, LDLDIRECT  Physical Exam:    VS:  BP 120/64   Pulse 73   Ht 5\' 10"  (1.778 m)   Wt 194 lb (88 kg)   SpO2 98%   BMI 27.84 kg/m     Wt Readings from Last 3 Encounters:  02/16/19 194 lb (88 kg)  12/23/18 196 lb 12.8 oz (89.3 kg)  12/14/18 195 lb (88.5 kg)     GEN:  Well nourished, well developed in no acute distress HEENT: Normal NECK: No JVD; No carotid bruits LYMPHATICS: No lymphadenopathy CARDIAC: RRR, no murmurs, no rubs,  no gallops RESPIRATORY:  Clear to auscultation without rales, wheezing or rhonchi  ABDOMEN: Soft, non-tender, non-distended MUSCULOSKELETAL:  No edema; No deformity  SKIN: Warm and dry LOWER EXTREMITIES: no swelling NEUROLOGIC:  Alert and oriented x 3 PSYCHIATRIC:  Normal affect   ASSESSMENT:    1.  Ischemic cardiomyopathy   2. Status post coronary artery bypass graft   3. Essential hypertension   4. Dyslipidemia (high LDL; low HDL)    PLAN:    In order of problems listed above:  1. Ischemic cardiomyopathy.  Unable to put on ACE inhibitor or ARB, intolerant to Imdur.  Continue with Coreg. 2. Status post coronary bypass graft with small area of ischemia in inferior wall.  Continue present management since he is asymptomatic.  Imdur will be discontinued. 3. Essential hypertension blood pressure well controlled. 4. Dyslipidemia Crestor and fish oil only.  We will continue present management for now   Medication Adjustments/Labs and Tests Ordered: Current medicines are reviewed at length with the patient today.  Concerns regarding medicines are outlined above.  No orders of the defined types were placed in this encounter.  Medication changes: No orders of the defined types were placed in this encounter.   Signed, Georgeanna Lea, MD, [facility] 02/16/2019 2:42 PM    [facility]

## 2019-03-27 ENCOUNTER — Ambulatory Visit: Payer: Medicare Other | Admitting: Cardiology

## 2019-03-29 ENCOUNTER — Telehealth: Payer: Self-pay | Admitting: Cardiology

## 2019-03-29 NOTE — Telephone Encounter (Signed)
Has questions about starting a new medication

## 2019-03-29 NOTE — Telephone Encounter (Signed)
Called patient, spoke to patient's wife. She wants to know if Dr. Agustin Cree still plans to start him on a medication, however the plan was for him to see his kidney doctor first to get labs and then we would decide on starting him on medications. I will request records from [facility] and consult with Dr. Agustin Cree.   During the call the wife reports for the past couple days he was having some chest tightness/pressure that comes and goes. He took a nitro and this helped with the pain yesterday. He isn't having any radiation of pain, or shortness of breath. Patient is not currently having any chest pain. Advised patient wife to have the patient go to the emergency room if chest pain returns or continues, she verbally understood. No further questions.

## 2019-03-29 NOTE — Telephone Encounter (Signed)
Lets add amlodipine 2,5 mg po qd

## 2019-03-30 NOTE — Telephone Encounter (Signed)
Requested labs from [facility]

## 2019-03-31 MED ORDER — AMLODIPINE BESYLATE 2.5 MG PO TABS: 2.5000 mg | ORAL_TABLET | Freq: Every day | ORAL | 1 refills | Status: DC

## 2019-03-31 NOTE — Addendum Note (Signed)
Addended by: Ashok Norris on: 03/31/2019 05:13 PM   Modules accepted: Orders

## 2019-03-31 NOTE — Telephone Encounter (Signed)
Called patient and let the wife know for patient to start amlodipine 2.5 mg daily she verbally understood no further questions.

## 2019-04-21 ENCOUNTER — Other Ambulatory Visit: Payer: Self-pay

## 2019-04-21 ENCOUNTER — Encounter: Payer: Self-pay | Admitting: Cardiology

## 2019-04-21 ENCOUNTER — Ambulatory Visit (INDEPENDENT_AMBULATORY_CARE_PROVIDER_SITE_OTHER): Payer: Medicare Other | Admitting: Cardiology

## 2019-04-21 VITALS — BP 136/74 | HR 77 | Ht 70.0 in | Wt 187.2 lb

## 2019-04-21 DIAGNOSIS — I10 Essential (primary) hypertension: Secondary | ICD-10-CM

## 2019-04-21 DIAGNOSIS — Z951 Presence of aortocoronary bypass graft: Secondary | ICD-10-CM

## 2019-04-21 DIAGNOSIS — I255 Ischemic cardiomyopathy: Secondary | ICD-10-CM

## 2019-04-21 DIAGNOSIS — I2510 Atherosclerotic heart disease of native coronary artery without angina pectoris: Secondary | ICD-10-CM | POA: Diagnosis not present

## 2019-04-21 DIAGNOSIS — E785 Hyperlipidemia, unspecified: Secondary | ICD-10-CM

## 2019-04-21 MED ORDER — RANOLAZINE ER 500 MG PO TB12: 500.0000 mg | ORAL_TABLET | Freq: Two times a day (BID) | ORAL | 1 refills | Status: DC

## 2019-04-21 NOTE — Progress Notes (Signed)
Cardiology Office Note:    Date:  04/21/2019   ID:  DOMINION KATHAN, DOB 1941-03-27, MRN [id number]  PCP:  Ernestene Kiel, MD  Cardiologist:  Jenne Campus, MD    Referring MD: Ernestene Kiel, MD   Chief Complaint  Patient presents with  . Follow-up  Doing fair  History of Present Illness:    [name] is a 78 y.o. male with coronary disease status post coronary bypass graft, stress test showing some ischemia involving inferior wall which is only very mild.  Comes today to my office to follow-up on that.  Initially we try Imdur with intolerance to it after that time continue Norvasc also did not tolerate this medication and again he comes today to talk about this initially told me he had absolutely no pains but then he tells me that when he goes to his mailbox and back he will get sometimes pain he stops pain goes away.  He try nitroglycerin but no relief for that.  We talked in length about what to do with this situation because of baseline kidney dysfunction I prefer not to rush towards cardiac catheterization.  We will try to give him ranolazine and see how he will be able to tolerate that.  However overall he told me straight it a few he cannot tolerate her nose and well he is also good without any extra medications.  Past Medical History:  Diagnosis Date  . Anxiety   . Arthritis    neck; limited neck movement  . Chronic rhinitis   . Coronary artery disease   . Depression    no treatment since he retired  . Dyspnea    with exertion  . GERD (gastroesophageal reflux disease)   . Headache    hx subdural hematoma  . Hyperlipidemia   . Hypertension   . Hypothyroidism   . OSA (obstructive sleep apnea)   . Stroke [facility]) ~63yrs ago   mild affects Lt eye  . Subdural hematoma ([place]) 2004    Past Surgical History:  Procedure Laterality Date  . APPENDECTOMY  1970s  . BRAIN HEMATOMA EVACUATION  ~2005   subdural hematoma  . CARPAL TUNNEL RELEASE Right 05/21/2016   Procedure: CARPAL TUNNEL RELEASE, right;  Surgeon: Daryll Brod, MD;  Location: [place];  Service: Orthopedics;  Laterality: Right;  FAB  . CATARACT EXTRACTION Left   . CORONARY ARTERY BYPASS GRAFT  ~2006  . Heart bypass  2005  . ROTATOR CUFF REPAIR Right ~4 years    Current Medications: Current Meds  Medication Sig  . aspirin 81 MG tablet Take 81 mg by mouth daily.    . carvedilol (COREG) 12.5 MG tablet Take 1 tablet (12.5 mg total) by mouth 2 (two) times daily. (Patient taking differently: Take 6.25 mg by mouth 2 (two) times daily. )  . Cholecalciferol (VITAMIN D) 1000 UNITS capsule Take 1,000 Units by mouth daily.    . Coenzyme Q10 (COQ10) 100 MG CAPS Take 3 capsules by mouth daily.   . Cyanocobalamin (B-12) 2500 MCG TABS Take 1 tablet by mouth daily.  Marland Kitchen levothyroxine (SYNTHROID, LEVOTHROID) 75 MCG tablet Take 1 tablet by mouth daily.  . Misc Natural Products (PROSTATE HEALTH PO) Take 1 tablet by mouth daily.  . montelukast (SINGULAIR) 10 MG tablet Take 10 mg by mouth at bedtime.  . Multiple Vitamin (MULTIVITAMIN) tablet Take 1 tablet by mouth daily.  . nitroGLYCERIN (NITROSTAT) 0.4 MG SL tablet Place 1 tablet under the tongue every 5  minutes as needed.  . Omega-3 Fatty Acids (FISH OIL) 1200 MG CAPS Take 1,200 mg by mouth 2 (two) times daily.   . Probiotic Product (PROBIOTIC ADVANCED PO) Take 1 capsule by mouth daily.  . rosuvastatin (CRESTOR) 5 MG tablet Take 1 tablet by mouth daily.  . tamsulosin (FLOMAX) 0.4 MG CAPS capsule Take 0.8 mg by mouth daily.      Allergies:   Patient has no known allergies.   Social History   Socioeconomic History  . Marital status: Married    Spouse name: [name]  . Number of children: 1  . Years of education: Not on file  . 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 Financial resource strain: Not on file  . Food insecurity    Worry: Not on  file    Inability: Not on file  . Transportation needs    Medical: Not on file    Non-medical: Not on file  Tobacco Use  . Smoking status: Never Smoker  . Smokeless tobacco: Never Used  Substance and Sexual Activity  . Alcohol use: No  . Drug use: No  . Sexual activity: Not on file  Lifestyle  . Physical activity    Days per week: Not on file    Minutes per session: Not on file  . Stress: Not on file  Relationships  . Social Musician on phone: Not on file    Gets together: Not on file    Attends religious service: Not on file    Active member of club or organization: Not on file    Attends meetings of clubs or organizations: Not on file    Relationship status: Not on file  Other Topics Concern  . 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 ROS:   Please see the history of present illness.    All 14 point review of systems negative except as described per history of present illness  EKGs/Labs/Other Studies Reviewed:      Recent Labs: 12/29/2018: BUN 20; Creatinine, Ser 1.67; Potassium 4.8; Sodium 142  Recent Lipid Panel No results found for: CHOL, TRIG, HDL, CHOLHDL, VLDL, LDLCALC, LDLDIRECT  Physical Exam:    VS:  BP 136/74   Pulse 77   Ht 5\' 10"  (1.778 m)   Wt 187 lb 3.2 oz (84.9 kg)   SpO2 98%   BMI 26.86 kg/m     Wt Readings from Last 3 Encounters:  04/21/19 187 lb 3.2 oz (84.9 kg)  02/16/19 194 lb (88 kg)  12/23/18 196 lb 12.8 oz (89.3 kg)     GEN:  Well nourished, well developed in no acute distress HEENT: Normal NECK: No JVD; No carotid bruits LYMPHATICS: No lymphadenopathy CARDIAC: RRR, no murmurs, no rubs, no gallops RESPIRATORY:  Clear to auscultation without rales, wheezing or rhonchi  ABDOMEN: Soft, non-tender, non-distended  MUSCULOSKELETAL:  No edema; No deformity  SKIN: Warm and dry LOWER EXTREMITIES: no swelling NEUROLOGIC:  Alert and oriented x 3 PSYCHIATRIC:  Normal affect   ASSESSMENT:    1. Ischemic cardiomyopathy   2. Essential hypertension   3. Coronary artery disease involving native coronary artery of native  heart without angina pectoris   4. Status post coronary artery bypass graft   5. Dyslipidemia (high LDL; low HDL)    PLAN:    In order of problems listed above:  1. Ischemic cardiomyopathy.  Continue present management not candidate for ACE inhibitor or ARB because of kidney dysfunction. 2. Essential hypertension blood pressure well controlled continue present management. 3. Coronary disease with mild ischemia on the stress test.  We favor medical therapy because of baseline kidney dysfunction we will try ranolazine 500 twice daily 4. Status post coronary bypass graft.  Noted 5. Dyslipidemia he is on Crestor which I will continue.   Medication Adjustments/Labs and Tests Ordered: Current medicines are reviewed at length with the patient today.  Concerns regarding medicines are outlined above.  No orders of the defined types were placed in this encounter.  Medication changes: No orders of the defined types were placed in this encounter.   Signed, Georgeanna Leaobert J. Krasowski, MD, [facility] 04/21/2019 9:55 AM    [facility]

## 2019-04-21 NOTE — Addendum Note (Signed)
Addended by: Ashok Norris on: 04/21/2019 10:08 AM   Modules accepted: Orders

## 2019-04-21 NOTE — Patient Instructions (Signed)
Medication Instructions:  Your physician has recommended you make the following change in your medication:   START: Ranolazine 500 mg twice daily    *If you need a refill on your cardiac medications before your next appointment, please call your pharmacy*  Lab Work: None.  If you have labs (blood work) drawn today and your tests are completely normal, you will receive your results only by: Marland Kitchen MyChart Message (if you have MyChart) OR . A paper copy in the mail If you have any lab test that is abnormal or we need to change your treatment, we will call you to review the results.  Testing/Procedures: None.   Follow-Up: At [facility], you and your health needs are our priority.  As part of our continuing mission to provide you with exceptional heart care, we have created designated Provider Care Teams.  These Care Teams include your primary Cardiologist (physician) and Advanced Practice Providers (APPs -  Physician Assistants and Nurse Practitioners) who all work together to provide you with the care you need, when you need it.  Your next appointment:   4 month(s)  The format for your next appointment:   In Person  Provider:   Jenne Campus, MD  Other Instructions   Ranolazine tablets, extended release What is this medicine? RANOLAZINE (ra NOE la zeen) is a heart medicine. It is used to treat chronic chest pain (angina). This medicine must be taken regularly. It will not relieve an acute episode of chest pain. This medicine may be used for other purposes; ask your health care provider or pharmacist if you have questions. COMMON BRAND NAME(S): Ranexa What should I tell my health care provider before I take this medicine? They need to know if you have any of these conditions:  heart disease  irregular heartbeat  kidney disease  liver disease  low levels of potassium or magnesium in the blood  an unusual or allergic reaction to ranolazine, other medicines, foods,  dyes, or preservatives  pregnant or trying to get pregnant  breast-feeding How should I use this medicine? Take this medicine by mouth with a glass of water. Follow the directions on the prescription label. Do not cut, crush, or chew this medicine. Take with or without food. Do not take this medication with grapefruit juice. Take your doses at regular intervals. Do not take your medicine more often then directed. Talk to your pediatrician regarding the use of this medicine in children. Special care may be needed. Overdosage: If you think you have taken too much of this medicine contact a poison control center or emergency room at once. NOTE: This medicine is only for you. Do not share this medicine with others. What if I miss a dose? If you miss a dose, take it as soon as you can. If it is almost time for your next dose, take only that dose. Do not take double or extra doses. What may interact with this medicine? Do not take this medicine with any of the following medications:  antivirals for HIV or AIDS  cerivastatin  certain antibiotics like chloramphenicol, clarithromycin, dalfopristin; quinupristin, isoniazid, rifabutin, rifampin, rifapentine  certain medicines used for cancer like imatinib, nilotinib  certain medicines for fungal infections like fluconazole, itraconazole, ketoconazole, posaconazole, voriconazole  certain medicines for irregular heart beat like dronedarone  certain medicines for seizures like carbamazepine, fosphenytoin, oxcarbazepine, phenobarbital, phenytoin  cisapride  conivaptan  cyclosporine  grapefruit or grapefruit juice  lumacaftor; ivacaftor  nefazodone  pimozide  quinacrine  St John's  wort  thioridazine This medicine may also interact with the following medications:  alfuzosin  certain medicines for depression, anxiety, or psychotic disturbances like bupropion, citalopram, fluoxetine, fluphenazine, paroxetine, perphenazine,  risperidone, sertraline, trifluoperazine  certain medicines for cholesterol like atorvastatin, lovastatin, simvastatin  certain medicines for stomach problems like octreotide, palonosetron, prochlorperazine  eplerenone  ergot alkaloids like dihydroergotamine, ergonovine, ergotamine, methylergonovine  metformin  nicardipine  other medicines that prolong the QT interval (cause an abnormal heart rhythm) like dofetilide, ziprasidone  sirolimus  tacrolimus This list may not describe all possible interactions. Give your health care provider a list of all the medicines, herbs, non-prescription drugs, or dietary supplements you use. Also tell them if you smoke, drink alcohol, or use illegal drugs. Some items may interact with your medicine. What should I watch for while using this medicine? Visit your doctor for regular check ups. Tell your doctor or healthcare professional if your symptoms do not start to get better or if they get worse. This medicine will not relieve an acute attack of angina or chest pain. This medicine can change your heart rhythm. Your health care provider may check your heart rhythm by ordering an electrocardiogram (ECG) while you are taking this medicine. You may get drowsy or dizzy. Do not drive, use machinery, or do anything that needs mental alertness until you know how this medicine affects you. Do not stand or sit up quickly, especially if you are an older patient. This reduces the risk of dizzy or fainting spells. Alcohol may interfere with the effect of this medicine. Avoid alcoholic drinks. If you are scheduled for any medical or dental procedure, tell your healthcare provider that you are taking this medicine. This medicine can interact with other medicines used during surgery. What side effects may I notice from receiving this medicine? Side effects that you should report to your doctor or health care professional as soon as possible:  allergic reactions like  skin rash, itching or hives, swelling of the face, lips, or tongue  breathing problems  changes in vision  fast, irregular or pounding heartbeat  feeling faint or lightheaded, falls  low or high blood pressure  numbness or tingling feelings  ringing in the ears  tremor or shakiness  slow heartbeat (fewer than 50 beats per minute)  swelling of the legs or feet Side effects that usually do not require medical attention (report to your doctor or health care professional if they continue or are bothersome):  constipation  drowsy  dry mouth  headache  nausea or vomiting  stomach upset This list may not describe all possible side effects. Call your doctor for medical advice about side effects. You may report side effects to FDA at 1-800-FDA-1088. Where should I keep my medicine? Keep out of the reach of children. Store at room temperature between 15 and 30 degrees C (59 and 86 degrees F). Throw away any unused medicine after the expiration date. NOTE: This sheet is a summary. It may not cover all possible information. If you have questions about this medicine, talk to your doctor, pharmacist, or health care provider.  2020 Elsevier/Gold Standard (2018-05-10 09:18:49)

## 2019-06-06 DIAGNOSIS — H43812 Vitreous degeneration, left eye: Secondary | ICD-10-CM | POA: Diagnosis not present

## 2019-06-06 DIAGNOSIS — H34833 Tributary (branch) retinal vein occlusion, bilateral, with macular edema: Secondary | ICD-10-CM | POA: Diagnosis not present

## 2019-06-06 DIAGNOSIS — H35352 Cystoid macular degeneration, left eye: Secondary | ICD-10-CM | POA: Diagnosis not present

## 2019-06-06 DIAGNOSIS — H34832 Tributary (branch) retinal vein occlusion, left eye, with macular edema: Secondary | ICD-10-CM | POA: Diagnosis not present

## 2019-06-06 DIAGNOSIS — H34831 Tributary (branch) retinal vein occlusion, right eye, with macular edema: Secondary | ICD-10-CM | POA: Diagnosis not present

## 2019-06-08 ENCOUNTER — Other Ambulatory Visit: Payer: Self-pay | Admitting: Cardiology

## 2019-06-08 DIAGNOSIS — N1832 Chronic kidney disease, stage 3b: Secondary | ICD-10-CM | POA: Diagnosis not present

## 2019-06-13 ENCOUNTER — Telehealth: Payer: Self-pay | Admitting: Cardiology

## 2019-06-13 ENCOUNTER — Other Ambulatory Visit: Payer: Self-pay

## 2019-06-13 NOTE — Telephone Encounter (Signed)
pts wife states he does not take Ranexa

## 2019-06-17 IMAGING — US ULTRASOUND RENAL ARTERY STENOSIS
1 series · 13 of 25 positions shown · non-contrast
Comparison: None.

CLINICAL DATA: 78-year-old male with chronic stage 3 kidney
disease.

EXAM:
RENAL/URINARY TRACT ULTRASOUND
RENAL DUPLEX DOPPLER ULTRASOUND

[Series 1: ultrasound renal artery stenosis · 0.34mm/px · 13 of 75 slices shown]
[im 1/75]
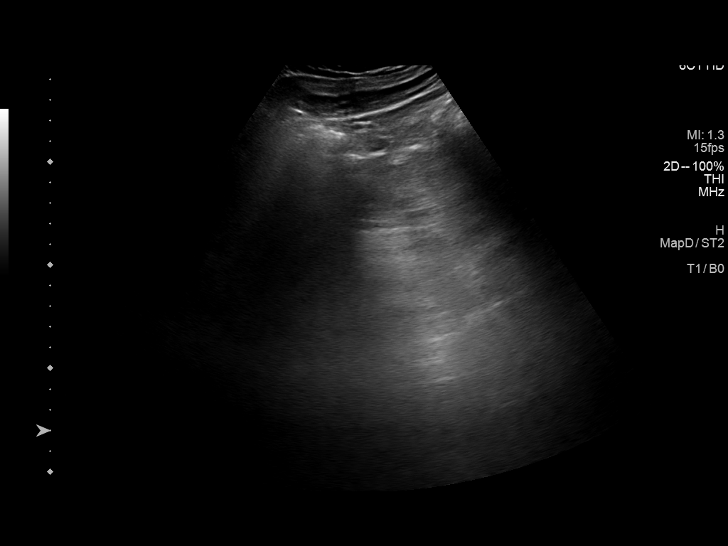
[im 7/75]
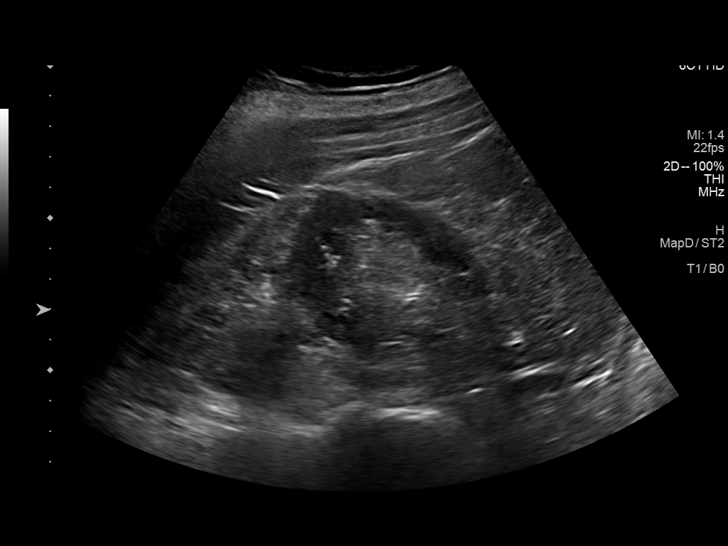
[im 13/75]
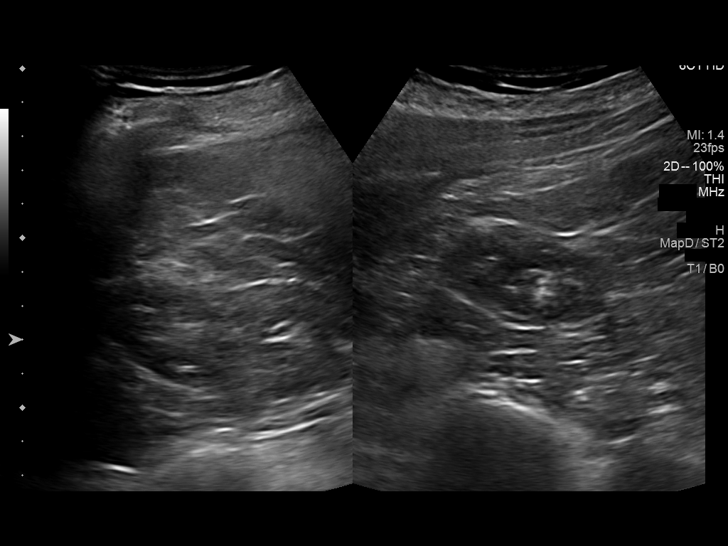
[im 19/75]
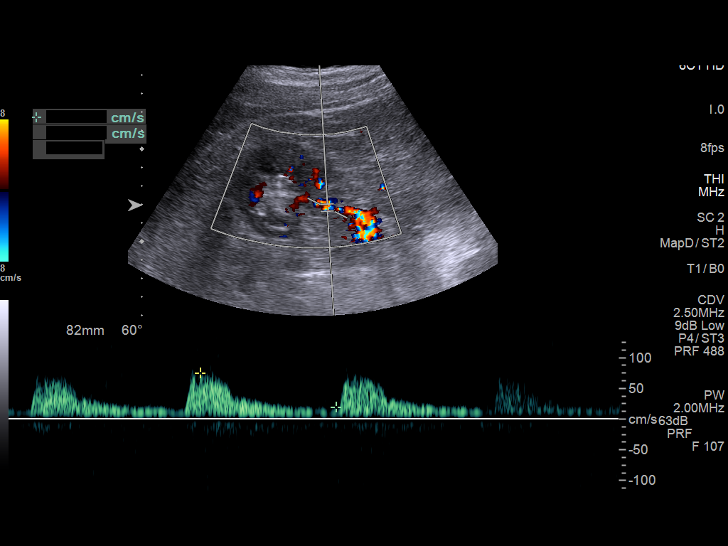
[im 25/75]
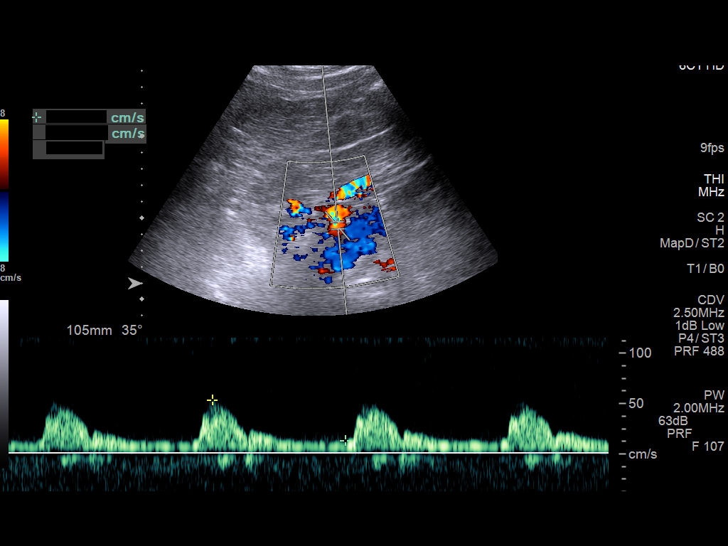
[im 31/75]
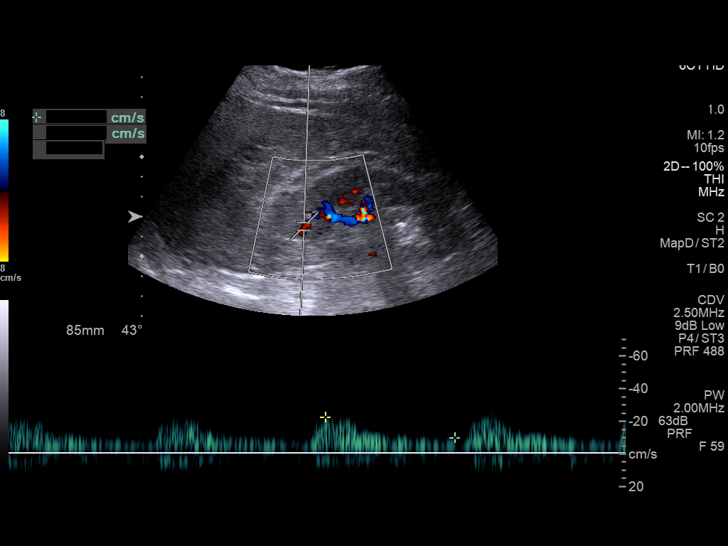
[im 38/75]
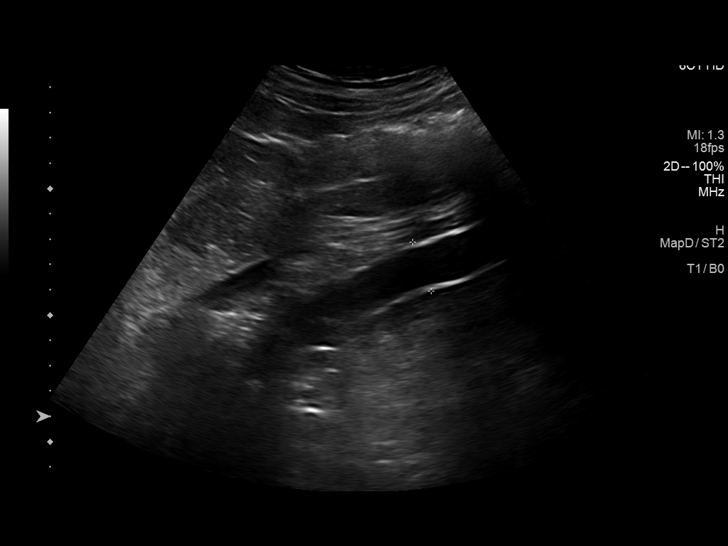
[im 44/75]
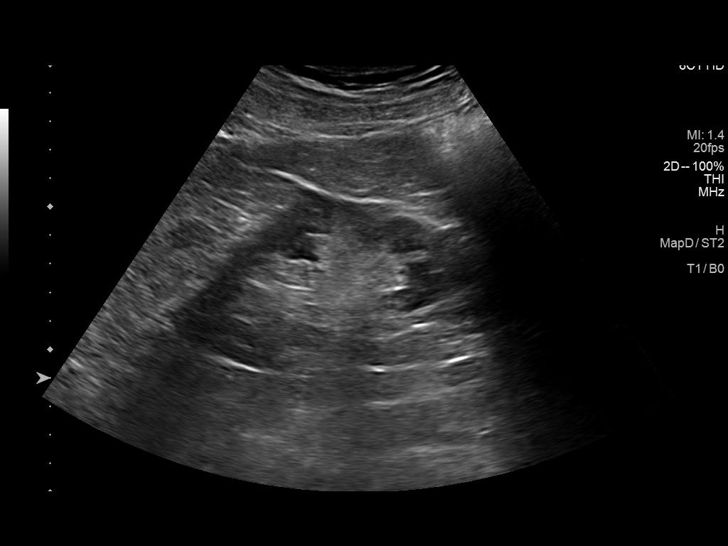
[im 50/75]
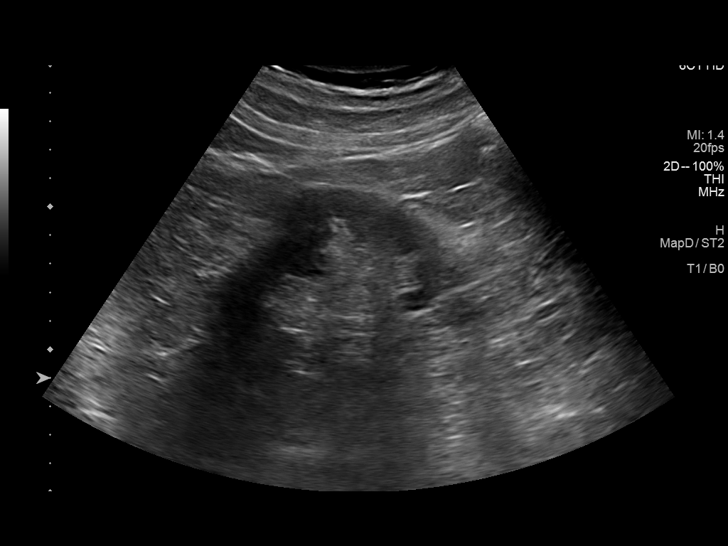
[im 56/75]
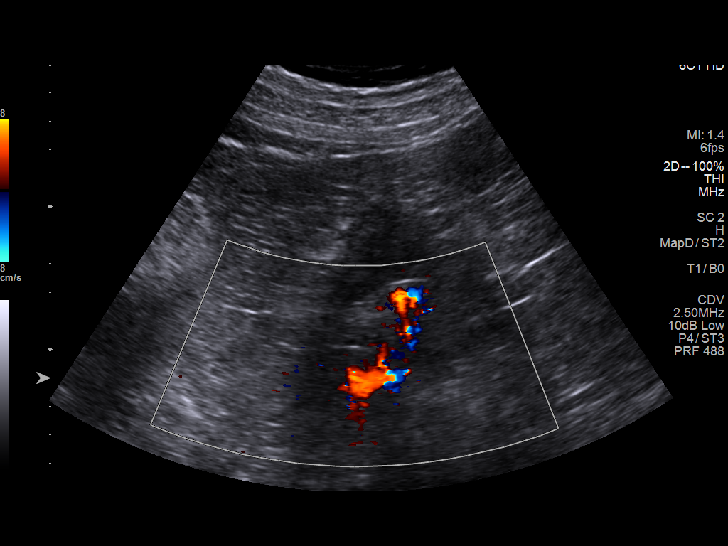
[im 62/75]
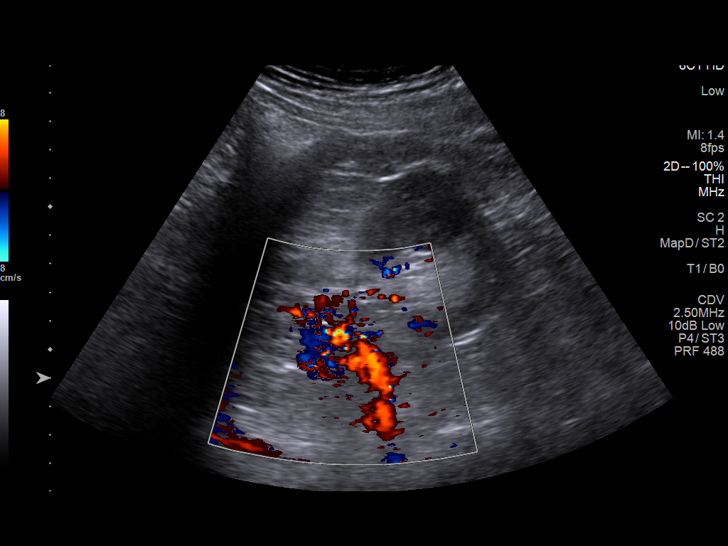
[im 68/75]
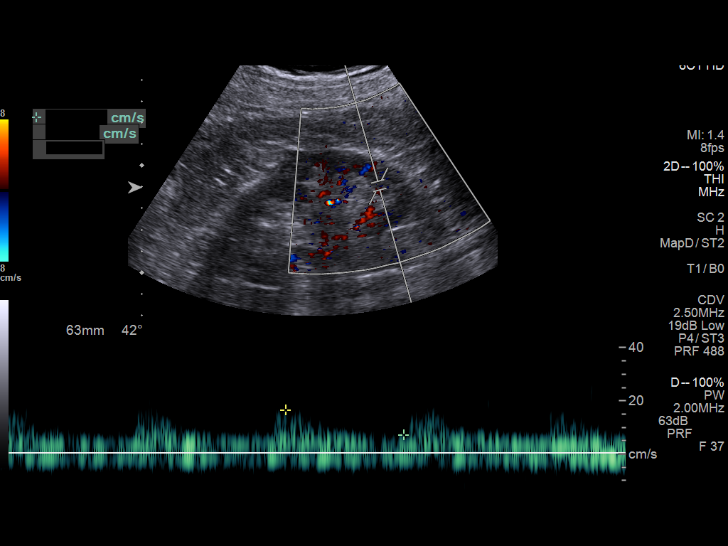
[im 75/75]
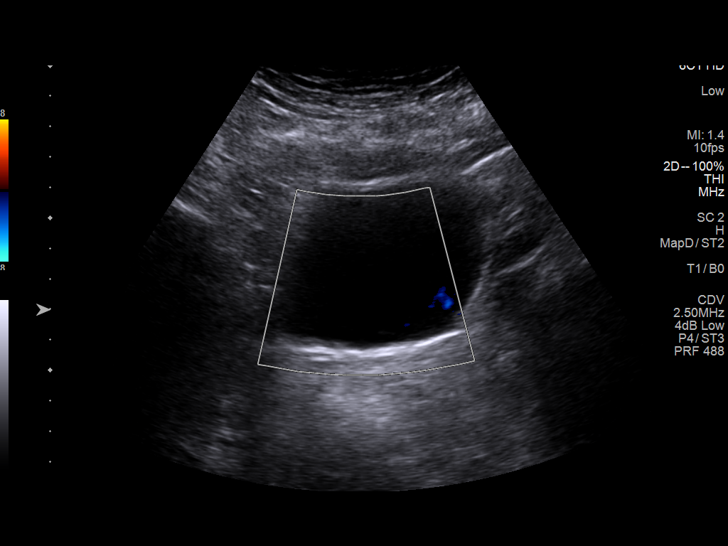

[13 of 25 positions shown; findings below may reference images not displayed]

FINDINGS: Right Kidney:

Length: 10.2 x 5.4 x 5.0 cm for a total volume of 144 mL. Mildly
echogenic renal parenchyma. Additionally, there is diffuse renal
cortical thinning with lipomatosis of the renal hilum. No evidence
of hydronephrosis, nephrolithiasis or solid mass.

Left Kidney:

Length: 11.3 x 5.2 x 4.9 cm for a total volume of 148 mL. Mildly
echogenic renal parenchyma. Additionally, there is diffuse renal
cortical thinning with lipomatosis of the renal hilum. No evidence
of hydronephrosis, nephrolithiasis or solid mass.

Bladder:  Within normal limits

RENAL DUPLEX ULTRASOUND

Right Renal Artery Velocities:

Origin:  54 cm/sec

Mid:  76 cm/sec

Hilum:  75 cm/sec

Interlobar:  30 cm/sec

Arcuate:  23 cm/sec

Left Renal Artery Velocities:

Origin:  47 cm/sec

Mid:  42 cm/sec

Hilum:  52 cm/sec

Interlobar:  33 cm/sec

Arcuate:  21 cm/sec

Aortic Velocity:  95 cm/sec

Right Renal-Aortic Ratios:

Origin:

Mid:

Hilum:

Interlobar:

Arcuate:

Left Renal-Aortic Ratios:

Origin:

Mid:

Hilum:

Interlobar:

Arcuate:
IMPRESSION: 1. No evidence for renal artery stenosis.
2. Echogenic renal parenchyma bilaterally consistent with underlying
medical renal disease.
3. Renal cortical thinning bilaterally.
4. No evidence of hydronephrosis, nephrolithiasis or solid renal
mass.

## 2019-06-20 DIAGNOSIS — G4733 Obstructive sleep apnea (adult) (pediatric): Secondary | ICD-10-CM | POA: Diagnosis not present

## 2019-06-27 DIAGNOSIS — H353112 Nonexudative age-related macular degeneration, right eye, intermediate dry stage: Secondary | ICD-10-CM | POA: Diagnosis not present

## 2019-06-27 DIAGNOSIS — H34831 Tributary (branch) retinal vein occlusion, right eye, with macular edema: Secondary | ICD-10-CM | POA: Diagnosis not present

## 2019-06-27 DIAGNOSIS — H43811 Vitreous degeneration, right eye: Secondary | ICD-10-CM | POA: Diagnosis not present

## 2019-07-11 DIAGNOSIS — H43812 Vitreous degeneration, left eye: Secondary | ICD-10-CM | POA: Diagnosis not present

## 2019-07-11 DIAGNOSIS — H34832 Tributary (branch) retinal vein occlusion, left eye, with macular edema: Secondary | ICD-10-CM | POA: Diagnosis not present

## 2019-07-21 DIAGNOSIS — G4733 Obstructive sleep apnea (adult) (pediatric): Secondary | ICD-10-CM | POA: Diagnosis not present

## 2019-08-01 DIAGNOSIS — H43811 Vitreous degeneration, right eye: Secondary | ICD-10-CM | POA: Diagnosis not present

## 2019-08-01 DIAGNOSIS — H34831 Tributary (branch) retinal vein occlusion, right eye, with macular edema: Secondary | ICD-10-CM | POA: Diagnosis not present

## 2019-08-01 DIAGNOSIS — H353112 Nonexudative age-related macular degeneration, right eye, intermediate dry stage: Secondary | ICD-10-CM | POA: Diagnosis not present

## 2019-08-15 DIAGNOSIS — H35352 Cystoid macular degeneration, left eye: Secondary | ICD-10-CM | POA: Diagnosis not present

## 2019-08-15 DIAGNOSIS — H353122 Nonexudative age-related macular degeneration, left eye, intermediate dry stage: Secondary | ICD-10-CM | POA: Diagnosis not present

## 2019-08-15 DIAGNOSIS — H43812 Vitreous degeneration, left eye: Secondary | ICD-10-CM | POA: Diagnosis not present

## 2019-08-15 DIAGNOSIS — H34832 Tributary (branch) retinal vein occlusion, left eye, with macular edema: Secondary | ICD-10-CM | POA: Diagnosis not present

## 2019-08-18 DIAGNOSIS — G4733 Obstructive sleep apnea (adult) (pediatric): Secondary | ICD-10-CM | POA: Diagnosis not present

## 2019-08-28 ENCOUNTER — Encounter: Payer: Self-pay | Admitting: Cardiology

## 2019-08-28 ENCOUNTER — Other Ambulatory Visit: Payer: Self-pay

## 2019-08-28 ENCOUNTER — Ambulatory Visit (INDEPENDENT_AMBULATORY_CARE_PROVIDER_SITE_OTHER): Payer: Medicare PPO | Admitting: Cardiology

## 2019-08-28 VITALS — BP 148/82 | HR 74 | Temp 97.6°F | Resp 20 | Ht 68.0 in | Wt 197.4 lb

## 2019-08-28 DIAGNOSIS — E785 Hyperlipidemia, unspecified: Secondary | ICD-10-CM

## 2019-08-28 DIAGNOSIS — I255 Ischemic cardiomyopathy: Secondary | ICD-10-CM | POA: Diagnosis not present

## 2019-08-28 DIAGNOSIS — I10 Essential (primary) hypertension: Secondary | ICD-10-CM

## 2019-08-28 DIAGNOSIS — I2510 Atherosclerotic heart disease of native coronary artery without angina pectoris: Secondary | ICD-10-CM

## 2019-08-28 NOTE — Patient Instructions (Signed)
Medication Instructions:  No medication changes. *If you need a refill on your cardiac medications before your next appointment, please call your pharmacy*   Lab Work: None ordered If you have labs (blood work) drawn today and your tests are completely normal, you will receive your results only by: . MyChart Message (if you have MyChart) OR . A paper copy in the mail If you have any lab test that is abnormal or we need to change your treatment, we will call you to review the results.   Testing/Procedures: None ordered   Follow-Up: At [facility], you and your health needs are our priority.  As part of our continuing mission to provide you with exceptional heart care, we have created designated Provider Care Teams.  These Care Teams include your primary Cardiologist (physician) and Advanced Practice Providers (APPs -  Physician Assistants and Nurse Practitioners) who all work together to provide you with the care you need, when you need it.  We recommend signing up for the patient portal called "MyChart".  Sign up information is provided on this After Visit Summary.  MyChart is used to connect with patients for Virtual Visits (Telemedicine).  Patients are able to view lab/test results, encounter notes, upcoming appointments, etc.  Non-urgent messages can be sent to your provider as well.   To learn more about what you can do with MyChart, go to [email or web address].    Your next appointment:   5 month(s)  The format for your next appointment:   In Person  Provider:   Robert Krasowski, MD   Other Instructions NA  

## 2019-08-28 NOTE — Progress Notes (Signed)
Cardiology Office Note:    Date:  08/28/2019   ID:  LASALLE ABEE, DOB 1941/02/09, MRN [id number]  PCP:  Ernestene Kiel, MD  Cardiologist:  Jenne Campus, MD    Referring MD: Ernestene Kiel, MD   Chief Complaint  Patient presents with  . Follow-up    4 month FU. C/O shortness of beath with climbing stairs.  Doing fine but short of breath  History of Present Illness:    [name] is a 79 y.o. male with past medical history significant for coronary artery disease, status post coronary artery bypass graft years ago, recent stress test showing small area of ischemia.  He does have some nonspecific symptoms mainly shortness of breath with exertion very atypical chest pain on the left side not related to exercise.  I try all different medications and restarted with nitrates which gave him headache, after that he was on Norvasc however did not tolerate this medication well, then I will switch to ranolazine.  He did not feel any better with ranolazine and find out with ranolazine can cause some kidney dysfunction and stopped it on his own.  Since that time he seems to be doing well.  He does have exertional shortness of breath.  Past Medical History:  Diagnosis Date  . Anxiety   . Arthritis    neck; limited neck movement  . Chronic rhinitis   . Coronary artery disease   . Depression    no treatment since he retired  . Dyspnea    with exertion  . GERD (gastroesophageal reflux disease)   . Headache    hx subdural hematoma  . Hyperlipidemia   . Hypertension   . Hypothyroidism   . OSA (obstructive sleep apnea)   . Stroke [facility]) ~46yrs ago   mild affects Lt eye  . Subdural hematoma ([place]) 2004    Past Surgical History:  Procedure Laterality Date  . APPENDECTOMY  1970s  . BRAIN HEMATOMA EVACUATION  ~2005   subdural hematoma  . CARPAL TUNNEL RELEASE Right 05/21/2016   Procedure: CARPAL TUNNEL RELEASE, right;  Surgeon: Daryll Brod, MD;  Location: [place];  Service: Orthopedics;  Laterality: Right;  FAB  . CATARACT EXTRACTION Left   . CORONARY ARTERY BYPASS GRAFT  ~2006  . Heart bypass  2005  . ROTATOR CUFF REPAIR Right ~4 years    Current Medications: Current Meds  Medication Sig  . aspirin 81 MG tablet Take 81 mg by mouth daily.    . carvedilol (COREG) 12.5 MG tablet Take 1 tablet (12.5 mg total) by mouth 2 (two) times daily. (Patient taking differently: Take 6.25 mg by mouth 2 (two) times daily. )  . carvedilol (COREG) 6.25 MG tablet   . Cholecalciferol (VITAMIN D) 1000 UNITS capsule Take 1,000 Units by mouth daily.    . Coenzyme Q10 (COQ10) 100 MG CAPS Take 3 capsules by mouth daily.   . Cyanocobalamin (B-12) 2500 MCG TABS Take 1 tablet by mouth daily.  Marland Kitchen levothyroxine (SYNTHROID, LEVOTHROID) 75 MCG tablet Take 1 tablet by mouth daily.  . Misc Natural Products (PROSTATE HEALTH PO) Take 1 tablet by mouth daily.  . Multiple Vitamin (MULTIVITAMIN) tablet Take 1 tablet by mouth daily.  . nitroGLYCERIN (NITROSTAT) 0.4 MG SL tablet Place 1 tablet under the tongue every 5 minutes as needed.  . Omega-3 Fatty Acids (FISH OIL) 1200 MG CAPS Take 1,200 mg by mouth 2 (two) times daily.   . Probiotic Product (PROBIOTIC ADVANCED PO) Take  1 capsule by mouth daily.  . rosuvastatin (CRESTOR) 5 MG tablet Take 1 tablet by mouth daily.  . tamsulosin (FLOMAX) 0.4 MG CAPS capsule Take 0.8 mg by mouth daily.      Allergies:   Patient has no known allergies.   Social History   Socioeconomic History  . Marital status: Married    Spouse name: [name]  . Number of children: 1  . Years of education: Not on file  . Highest education level: Not on file  Occupational History    Comment: [name]    Comment: [employer]  Tobacco Use  . Smoking status: Never Smoker  . Smokeless tobacco: Never Used  Substance and Sexual Activity  . Alcohol use: No  . Drug use: No  . Sexual activity: Not on file  Other Topics Concern  . 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 Difficulty of Paying Living Expenses:   Food Insecurity:   . Worried About Programme researcher, broadcasting/film/video in the Last Year:   . Barista in the Last Year:   Transportation Needs:   . Freight forwarder (Medical):   Marland Kitchen Lack of Transportation (Non-Medical):   Physical Activity:   . Days of Exercise per Week:   . Minutes of Exercise per Session:   Stress:   . Feeling of Stress :   Social Connections:   . Frequency of Communication with Friends and Family:   . Frequency of Social Gatherings with Friends and Family:   . Attends Religious Services:   . Active Member of Clubs or Organizations:   . Attends Banker Meetings:   Marland Kitchen Marital Status:      Family History: The patient's family history includes Heart disease in his father; Leukemia in his son; Stroke in his mother. ROS:   Please see the history of present illness.    All 14 point review of systems negative except as described per history of present illness  EKGs/Labs/Other Studies Reviewed:      Recent Labs: 12/29/2018: BUN 20; Creatinine, Ser 1.67; Potassium 4.8; Sodium 142  Recent Lipid Panel No results found for: CHOL, TRIG, HDL, CHOLHDL, VLDL, LDLCALC, LDLDIRECT  Physical Exam:    VS:  BP (!) 148/82 (BP Location: Left Arm, Patient Position: Sitting, Cuff Size: Normal)   Pulse 74   Temp 97.6 F (36.4 C)   Resp 20   Ht 5\' 8"  (1.727 m)   Wt 197 lb 6 oz (89.5 kg)   BMI 30.01 kg/m     Wt Readings from Last 3 Encounters:  08/28/19 197 lb 6 oz (89.5 kg)  04/21/19 187 lb 3.2 oz (84.9 kg)  02/16/19 194 lb (88 kg)     GEN:  Well nourished, well developed in no acute distress HEENT: Normal NECK: No JVD; No carotid bruits LYMPHATICS: No lymphadenopathy CARDIAC: RRR, no murmurs, no rubs, no gallops RESPIRATORY:  Clear to auscultation  without rales, wheezing or rhonchi  ABDOMEN: Soft, non-tender, non-distended MUSCULOSKELETAL:  No edema; No deformity  SKIN: Warm and dry LOWER EXTREMITIES: no swelling NEUROLOGIC:  Alert and oriented x 3 PSYCHIATRIC:  Normal affect   ASSESSMENT:    1. Coronary artery disease involving native coronary artery of native heart without angina pectoris   2. Essential hypertension   3. Ischemic cardiomyopathy  4. Dyslipidemia (high LDL; low HDL)    PLAN:    In order of problems listed above:  1. Coronary artery disease: Stable from that point review.  He does have minimally abnormal stress test.  Trying to manage this medically. 2. Essential hypertension blood pressure well controlled continue present management. 3. Ischemic cardiomyopathy but limited option terms of medications, last ejection fraction 45% from 7 last year, unable to use ACE inhibitor or ARB or Entresto secondary to kidney dysfunction 4. Dyslipidemia: Continue with statin.  Last fasting lipid profile I reviewed HPTN which was LDL 65 HDL 46.   Medication Adjustments/Labs and Tests Ordered: Current medicines are reviewed at length with the patient today.  Concerns regarding medicines are outlined above.  No orders of the defined types were placed in this encounter.  Medication changes: No orders of the defined types were placed in this encounter.   Signed, Georgeanna Lea, MD, [facility] 08/28/2019 12:51 PM    [facility]

## 2019-09-05 ENCOUNTER — Encounter (INDEPENDENT_AMBULATORY_CARE_PROVIDER_SITE_OTHER): Payer: Self-pay | Admitting: Ophthalmology

## 2019-09-05 ENCOUNTER — Ambulatory Visit (INDEPENDENT_AMBULATORY_CARE_PROVIDER_SITE_OTHER): Payer: Medicare PPO | Admitting: Ophthalmology

## 2019-09-05 ENCOUNTER — Other Ambulatory Visit: Payer: Self-pay

## 2019-09-05 DIAGNOSIS — H43811 Vitreous degeneration, right eye: Secondary | ICD-10-CM | POA: Diagnosis not present

## 2019-09-05 DIAGNOSIS — H43812 Vitreous degeneration, left eye: Secondary | ICD-10-CM | POA: Diagnosis not present

## 2019-09-05 DIAGNOSIS — H34832 Tributary (branch) retinal vein occlusion, left eye, with macular edema: Secondary | ICD-10-CM

## 2019-09-05 DIAGNOSIS — H34831 Tributary (branch) retinal vein occlusion, right eye, with macular edema: Secondary | ICD-10-CM | POA: Diagnosis not present

## 2019-09-05 DIAGNOSIS — H353112 Nonexudative age-related macular degeneration, right eye, intermediate dry stage: Secondary | ICD-10-CM | POA: Diagnosis not present

## 2019-09-05 HISTORY — DX: Vitreous degeneration, left eye: H43.812

## 2019-09-05 HISTORY — DX: Nonexudative age-related macular degeneration, right eye, intermediate dry stage: H35.3112

## 2019-09-05 HISTORY — DX: Tributary (branch) retinal vein occlusion, left eye, with macular edema: H34.8320

## 2019-09-05 HISTORY — DX: Vitreous degeneration, right eye: H43.811

## 2019-09-05 HISTORY — DX: Tributary (branch) retinal vein occlusion, right eye, with macular edema: H34.8310

## 2019-09-05 MED ORDER — BEVACIZUMAB CHEMO INJECTION 1.25MG/0.05ML SYRINGE FOR KALEIDOSCOPE
1.2500 mg | INTRAVITREAL | Status: AC | PRN
  Administered 2019-09-05: 11:00:00 1.25 mg via INTRAVITREAL

## 2019-09-05 NOTE — Patient Instructions (Signed)
The nature of posterior vitreous detachment was discussed with the patient as well as its physiology, its age prevalence, and its possible implication regarding retinal breaks and detachment.  An informational brochure was given to the patient.  All the patient's questions were answered.  The patient was asked to return if new or different flashes or floaters develops.   Patient was instructed to contact office immediately if any changes were noticed. I explained to the patient that vitreous inside the eye is similar to jello inside a bowl. As the jello melts it can start to pull away from the bowl, similarly the vitreous throughout our lives can begin to pull away from the retina. That process is called a posterior vitreous detachment. In some cases, the vitreous can tug hard enough on the retina to form a retinal tear. I discussed with the patient the signs and symptoms of a retinal detachment.  Do not rub the eye.   The nature of cystoid macular edema including causes, exacerbating factors, and treatments including steroid and nonsteroidal anti-inflammatory drops, periocular injections of steroids, and intravitreal injections of steroids were reviewed. An informational brochure was offered.  The patient's questions were answered. The potential side effects of the various treatments were reviewed including the potential for intraocular pressure rise with steroid treatments.  I often use topical NSAIDS alone or in conjunction with periocular steroids to resolve this condition.   Patient instructed not to compress or "rub" on the eye.  More rarely, surgery may be considered if the condition is not improving.  On therapy OU, FOR CME DUE TO BRANCH RETINAL VEIN OCCLUSIONS

## 2019-09-07 DIAGNOSIS — Z6831 Body mass index (BMI) 31.0-31.9, adult: Secondary | ICD-10-CM | POA: Diagnosis not present

## 2019-09-07 DIAGNOSIS — Z125 Encounter for screening for malignant neoplasm of prostate: Secondary | ICD-10-CM | POA: Diagnosis not present

## 2019-09-07 DIAGNOSIS — E785 Hyperlipidemia, unspecified: Secondary | ICD-10-CM | POA: Diagnosis not present

## 2019-09-07 DIAGNOSIS — Z79899 Other long term (current) drug therapy: Secondary | ICD-10-CM | POA: Diagnosis not present

## 2019-09-07 DIAGNOSIS — Z7189 Other specified counseling: Secondary | ICD-10-CM | POA: Diagnosis not present

## 2019-09-07 DIAGNOSIS — E039 Hypothyroidism, unspecified: Secondary | ICD-10-CM | POA: Diagnosis not present

## 2019-09-07 DIAGNOSIS — Z1331 Encounter for screening for depression: Secondary | ICD-10-CM | POA: Diagnosis not present

## 2019-09-07 DIAGNOSIS — N1832 Chronic kidney disease, stage 3b: Secondary | ICD-10-CM | POA: Diagnosis not present

## 2019-09-07 DIAGNOSIS — I10 Essential (primary) hypertension: Secondary | ICD-10-CM | POA: Diagnosis not present

## 2019-09-07 DIAGNOSIS — I25119 Atherosclerotic heart disease of native coronary artery with unspecified angina pectoris: Secondary | ICD-10-CM | POA: Diagnosis not present

## 2019-09-13 DIAGNOSIS — K219 Gastro-esophageal reflux disease without esophagitis: Secondary | ICD-10-CM | POA: Diagnosis not present

## 2019-09-14 DIAGNOSIS — I129 Hypertensive chronic kidney disease with stage 1 through stage 4 chronic kidney disease, or unspecified chronic kidney disease: Secondary | ICD-10-CM | POA: Diagnosis not present

## 2019-09-14 DIAGNOSIS — R3916 Straining to void: Secondary | ICD-10-CM | POA: Diagnosis not present

## 2019-09-14 DIAGNOSIS — N401 Enlarged prostate with lower urinary tract symptoms: Secondary | ICD-10-CM | POA: Diagnosis not present

## 2019-09-14 DIAGNOSIS — I25119 Atherosclerotic heart disease of native coronary artery with unspecified angina pectoris: Secondary | ICD-10-CM | POA: Diagnosis not present

## 2019-09-14 DIAGNOSIS — N183 Chronic kidney disease, stage 3 unspecified: Secondary | ICD-10-CM | POA: Diagnosis not present

## 2019-09-18 DIAGNOSIS — G4733 Obstructive sleep apnea (adult) (pediatric): Secondary | ICD-10-CM | POA: Diagnosis not present

## 2019-09-19 ENCOUNTER — Ambulatory Visit (INDEPENDENT_AMBULATORY_CARE_PROVIDER_SITE_OTHER): Payer: Medicare PPO | Admitting: Ophthalmology

## 2019-09-19 ENCOUNTER — Other Ambulatory Visit: Payer: Self-pay

## 2019-09-19 ENCOUNTER — Encounter (INDEPENDENT_AMBULATORY_CARE_PROVIDER_SITE_OTHER): Payer: Self-pay | Admitting: Ophthalmology

## 2019-09-19 DIAGNOSIS — H34832 Tributary (branch) retinal vein occlusion, left eye, with macular edema: Secondary | ICD-10-CM | POA: Diagnosis not present

## 2019-09-19 DIAGNOSIS — H34831 Tributary (branch) retinal vein occlusion, right eye, with macular edema: Secondary | ICD-10-CM

## 2019-09-19 MED ORDER — BEVACIZUMAB CHEMO INJECTION 1.25MG/0.05ML SYRINGE FOR KALEIDOSCOPE
1.2500 mg | INTRAVITREAL | Status: AC | PRN
  Administered 2019-09-19: 1.25 mg via INTRAVITREAL

## 2019-09-19 NOTE — Assessment & Plan Note (Signed)
The nature of branch retinal vein occlusion with macular edema was discussed.  The patient was given access to printed information.  The treatment options including continued observation looking for spontaneous resolution versus grid laser versus intravitreal Kenalog injection were discussed.  PRIMARY THERAPY CONSISTS of Anti-VEGF Therapies, AVASTIN, LUCENTIS AND EYLEA.  Their usage was discussed to assist in halting the progression of Macular Edema, in order to preserve, protect or improve acuity.  Additionally, at times, limited focal laser therapy is used in the management.  The risks and benefits of all these options were discussed with the patient.  The patient's questions were answered. 

## 2019-09-19 NOTE — Progress Notes (Signed)
09/19/2019     CHIEF COMPLAINT Patient presents for Retina Follow Up   HISTORY OF PRESENT ILLNESS: [name] is a 79 y.o. male who presents to the clinic today for:   HPI    Retina Follow Up    Patient presents with  CRVO/BRVO.  In left eye.  Severity is moderate.  Since onset it is gradually improving.  I, the attending physician,  performed the HPI with the patient and updated documentation appropriately.          Comments    5 Week BRVO f\u OS. Possible Avastin OS. OCT.  Pt states vision is stable. Denies fol and floaters.       Last edited by Elyse Jarvis on 09/19/2019  9:39 AM. (History)      Referring physician: [name], MD [address] [place],  [place] [address]  HISTORICAL INFORMATION:   Selected notes from the MEDICAL RECORD NUMBER       CURRENT MEDICATIONS: No current outpatient medications on file. (Ophthalmic Drugs)   No current facility-administered medications for this visit. (Ophthalmic Drugs)   Current Outpatient Medications (Other)  Medication Sig  . aspirin 81 MG tablet Take 81 mg by mouth daily.    . carvedilol (COREG) 12.5 MG tablet Take 1 tablet (12.5 mg total) by mouth 2 (two) times daily. (Patient taking differently: Take 6.25 mg by mouth 2 (two) times daily. )  . carvedilol (COREG) 6.25 MG tablet   . Cholecalciferol (VITAMIN D) 1000 UNITS capsule Take 1,000 Units by mouth daily.    . clotrimazole-betamethasone (LOTRISONE) cream   . Coenzyme Q10 (COQ10) 100 MG CAPS Take 3 capsules by mouth daily.   . Cyanocobalamin (B-12) 2500 MCG TABS Take 1 tablet by mouth daily.  Marland Kitchen levothyroxine (SYNTHROID, LEVOTHROID) 75 MCG tablet Take 1 tablet by mouth daily.  . Misc Natural Products (PROSTATE HEALTH PO) Take 1 tablet by mouth daily.  . montelukast (SINGULAIR) 10 MG tablet Take 10 mg by mouth at bedtime.  . Multiple Vitamin (MULTIVITAMIN) tablet Take 1 tablet by mouth daily.  . nitroGLYCERIN (NITROSTAT) 0.4 MG SL tablet Place 1  tablet under the tongue every 5 minutes as needed.  . Omega-3 Fatty Acids (FISH OIL) 1200 MG CAPS Take 1,200 mg by mouth 2 (two) times daily.   . Probiotic Product (PROBIOTIC ADVANCED PO) Take 1 capsule by mouth daily.  . rosuvastatin (CRESTOR) 5 MG tablet Take 1 tablet by mouth daily.  . tamsulosin (FLOMAX) 0.4 MG CAPS capsule Take 0.8 mg by mouth daily.    No current facility-administered medications for this visit. (Other)      REVIEW OF SYSTEMS:    ALLERGIES No Known Allergies  PAST MEDICAL HISTORY Past Medical History:  Diagnosis Date  . Anxiety   . Arthritis    neck; limited neck movement  . Chronic rhinitis   . Coronary artery disease   . Depression    no treatment since he retired  . Dyspnea    with exertion  . GERD (gastroesophageal reflux disease)   . Headache    hx subdural hematoma  . Hyperlipidemia   . Hypertension   . Hypothyroidism   . OSA (obstructive sleep apnea)   . Stroke [facility]) ~74yrs ago   mild affects Lt eye  . Subdural hematoma (H[facility]) 2004   Past Surgical History:  Procedure Laterality Date  . APPENDECTOMY  1970s  . BRAIN HEMATOMA EVACUATION  ~2005   subdural hematoma  . CARPAL TUNNEL  RELEASE Right 05/21/2016   Procedure: CARPAL TUNNEL RELEASE, right;  Surgeon: Daryll Brod, MD;  Location: [facility];  Service: Orthopedics;  Laterality: Right;  FAB  . CATARACT EXTRACTION Left   . CORONARY ARTERY BYPASS GRAFT  ~2006  . Heart bypass  2005  . ROTATOR CUFF REPAIR Right ~4 years    FAMILY HISTORY Family History  Problem Relation Age of Onset  . Heart disease Father   . Leukemia Son   . Stroke Mother     SOCIAL HISTORY Social History   Tobacco Use  . Smoking status: Never Smoker  . Smokeless tobacco: Never Used  Substance Use Topics  . Alcohol use: No  . Drug use: No         OPHTHALMIC EXAM: Base Eye Exam    Visual Acuity (Snellen - Linear)      Right Left   Dist [place] 20/20 20/20       Tonometry (Tonopen,  9:45 AM)      Right Left   Pressure 15 12       Pupils      Pupils Dark Light Shape React APD   Right PERRL 2 2 Round Slow None   Left PERRL 2 2 Round Slow None       Visual Fields (Counting fingers)      Left Right    Full Full       Neuro/Psych    Oriented x3: Yes   Mood/Affect: Normal       Dilation    Left eye: 1.0% Mydriacyl, 2.5% Phenylephrine @ 9:45 AM        Slit Lamp and Fundus Exam    External Exam      Right Left   External Normal Normal       Slit Lamp Exam      Right Left   Lids/Lashes Normal Normal   Conjunctiva/Sclera White and quiet White and quiet   Cornea Clear Clear   Anterior Chamber Deep and quiet Deep and quiet   Iris Round and reactive Round and reactive   Lens Posterior chamber intraocular lens Posterior chamber intraocular lens   Vitreous Normal Normal          IMAGING AND PROCEDURES  Imaging and Procedures for 09/19/19           ASSESSMENT/PLAN:  No problem-specific Assessment & Plan notes found for this encounter.      ICD-10-CM   1. Branch retinal vein occlusion with macular edema of right eye  H34.8310 OCT, Retina - OU - Both Eyes  2. Branch retinal vein occlusion with macular edema of left eye  H34.8320 OCT, Retina - OU - Both Eyes    1.  2.  3.  Ophthalmic Meds Ordered this visit:  No orders of the defined types were placed in this encounter.      No follow-ups on file.  There are no Patient Instructions on file for this visit.   Explained the diagnoses, plan, and follow up with the patient and they expressed understanding.  Patient expressed understanding of the importance of proper follow up care.   Clent Demark Jack Mineau M.D. Diseases & Surgery of the Retina and Vitreous [facility] 09/19/19     Abbreviations: M myopia (nearsighted); A astigmatism; H hyperopia (farsighted); P presbyopia; Mrx spectacle prescription;  CTL contact lenses; OD right eye; OS left eye; OU both eyes  XT  exotropia; ET esotropia; PEK punctate epithelial keratitis; PEE punctate epithelial  erosions; DES dry eye syndrome; MGD meibomian gland dysfunction; ATs artificial tears; PFAT's preservative free artificial tears; [place] nuclear sclerotic cataract; PSC posterior subcapsular cataract; ERM epi-retinal membrane; PVD posterior vitreous detachment; RD retinal detachment; DM diabetes mellitus; DR diabetic retinopathy; NPDR non-proliferative diabetic retinopathy; PDR proliferative diabetic retinopathy; CSME clinically significant macular edema; DME diabetic macular edema; dbh dot blot hemorrhages; CWS cotton wool spot; POAG primary open angle glaucoma; C/D cup-to-disc ratio; HVF humphrey visual field; GVF goldmann visual field; OCT optical coherence tomography; IOP intraocular pressure; BRVO Branch retinal vein occlusion; CRVO central retinal vein occlusion; CRAO central retinal artery occlusion; BRAO branch retinal artery occlusion; RT retinal tear; SB scleral buckle; PPV pars plana vitrectomy; VH Vitreous hemorrhage; PRP panretinal laser photocoagulation; IVK intravitreal kenalog; VMT vitreomacular traction; MH Macular hole;  NVD neovascularization of the disc; NVE neovascularization elsewhere; AREDS age related eye disease study; ARMD age related macular degeneration; POAG primary open angle glaucoma; EBMD epithelial/anterior basement membrane dystrophy; ACIOL anterior chamber intraocular lens; IOL intraocular lens; PCIOL posterior chamber intraocular lens; Phaco/IOL phacoemulsification with intraocular lens placement; [place] photorefractive keratectomy; LASIK laser assisted in situ keratomileusis; HTN hypertension; DM diabetes mellitus; COPD chronic obstructive pulmonary disease

## 2019-09-29 ENCOUNTER — Encounter: Payer: Self-pay | Admitting: Cardiology

## 2019-10-10 ENCOUNTER — Encounter (INDEPENDENT_AMBULATORY_CARE_PROVIDER_SITE_OTHER): Payer: Self-pay | Admitting: Ophthalmology

## 2019-10-10 ENCOUNTER — Other Ambulatory Visit: Payer: Self-pay

## 2019-10-10 ENCOUNTER — Ambulatory Visit (INDEPENDENT_AMBULATORY_CARE_PROVIDER_SITE_OTHER): Payer: Medicare PPO | Admitting: Ophthalmology

## 2019-10-10 DIAGNOSIS — H43811 Vitreous degeneration, right eye: Secondary | ICD-10-CM

## 2019-10-10 DIAGNOSIS — H43812 Vitreous degeneration, left eye: Secondary | ICD-10-CM

## 2019-10-10 DIAGNOSIS — H43813 Vitreous degeneration, bilateral: Secondary | ICD-10-CM

## 2019-10-10 DIAGNOSIS — H353112 Nonexudative age-related macular degeneration, right eye, intermediate dry stage: Secondary | ICD-10-CM | POA: Diagnosis not present

## 2019-10-10 DIAGNOSIS — H34831 Tributary (branch) retinal vein occlusion, right eye, with macular edema: Secondary | ICD-10-CM | POA: Diagnosis not present

## 2019-10-10 MED ORDER — BEVACIZUMAB CHEMO INJECTION 1.25MG/0.05ML SYRINGE FOR KALEIDOSCOPE
1.2500 mg | INTRAVITREAL | Status: AC | PRN
  Administered 2019-10-10: 1.25 mg via INTRAVITREAL

## 2019-10-10 NOTE — Progress Notes (Signed)
10/10/2019     CHIEF COMPLAINT Patient presents for Retina Follow Up   HISTORY OF PRESENT ILLNESS: [name] is a 79 y.o. male who presents to the clinic today for:   HPI    Retina Follow Up    Patient presents with  Wet AMD.  In right eye.  Duration of 5 weeks.  Since onset it is stable.          Comments    5 week follow up - OCT OU, Possible Avastin OD Patient denies change in vision and overall has no complaints.        Last edited by Gerda Diss on 10/10/2019  9:18 AM. (History)      Referring physician: Ernestene Kiel, MD [address] [place],  [place] [address]  HISTORICAL INFORMATION:   Selected notes from the MEDICAL RECORD NUMBER       CURRENT MEDICATIONS: No current outpatient medications on file. (Ophthalmic Drugs)   No current facility-administered medications for this visit. (Ophthalmic Drugs)   Current Outpatient Medications (Other)  Medication Sig  . aspirin 81 MG tablet Take 81 mg by mouth daily.    . carvedilol (COREG) 12.5 MG tablet Take 1 tablet (12.5 mg total) by mouth 2 (two) times daily. (Patient taking differently: Take 6.25 mg by mouth 2 (two) times daily. )  . carvedilol (COREG) 6.25 MG tablet   . Cholecalciferol (VITAMIN D) 1000 UNITS capsule Take 1,000 Units by mouth daily.    . clotrimazole-betamethasone (LOTRISONE) cream   . Coenzyme Q10 (COQ10) 100 MG CAPS Take 3 capsules by mouth daily.   . Cyanocobalamin (B-12) 2500 MCG TABS Take 1 tablet by mouth daily.  Marland Kitchen levothyroxine (SYNTHROID, LEVOTHROID) 75 MCG tablet Take 1 tablet by mouth daily.  . Misc Natural Products (PROSTATE HEALTH PO) Take 1 tablet by mouth daily.  . montelukast (SINGULAIR) 10 MG tablet Take 10 mg by mouth at bedtime.  . Multiple Vitamin (MULTIVITAMIN) tablet Take 1 tablet by mouth daily.  . nitroGLYCERIN (NITROSTAT) 0.4 MG SL tablet Place 1 tablet under the tongue every 5 minutes as needed.  . Omega-3 Fatty Acids (FISH OIL) 1200 MG CAPS Take 1,200 mg by  mouth 2 (two) times daily.   . Probiotic Product (PROBIOTIC ADVANCED PO) Take 1 capsule by mouth daily.  . rosuvastatin (CRESTOR) 5 MG tablet Take 1 tablet by mouth daily.  . tamsulosin (FLOMAX) 0.4 MG CAPS capsule Take 0.8 mg by mouth daily.    No current facility-administered medications for this visit. (Other)      REVIEW OF SYSTEMS:    ALLERGIES No Known Allergies  PAST MEDICAL HISTORY Past Medical History:  Diagnosis Date  . Anxiety   . Arthritis    neck; limited neck movement  . Chronic rhinitis   . Coronary artery disease   . Depression    no treatment since he retired  . Dyspnea    with exertion  . GERD (gastroesophageal reflux disease)   . Headache    hx subdural hematoma  . Hyperlipidemia   . Hypertension   . Hypothyroidism   . OSA (obstructive sleep apnea)   . Stroke [facility]) ~72yrs ago   mild affects Lt eye  . Subdural hematoma ([place]) 2004   Past Surgical History:  Procedure Laterality Date  . APPENDECTOMY  1970s  . BRAIN HEMATOMA EVACUATION  ~2005   subdural hematoma  . CARPAL TUNNEL RELEASE Right 05/21/2016   Procedure: CARPAL TUNNEL RELEASE, right;  Surgeon: Daryll Brod,  MD;  Location: [facility];  Service: Orthopedics;  Laterality: Right;  FAB  . CATARACT EXTRACTION Left   . CORONARY ARTERY BYPASS GRAFT  ~2006  . Heart bypass  2005  . ROTATOR CUFF REPAIR Right ~4 years    FAMILY HISTORY Family History  Problem Relation Age of Onset  . Heart disease Father   . Leukemia Son   . Stroke Mother     SOCIAL HISTORY Social History   Tobacco Use  . Smoking status: Never Smoker  . Smokeless tobacco: Never Used  Substance Use Topics  . Alcohol use: No  . Drug use: No         OPHTHALMIC EXAM: Base Eye Exam    Visual Acuity (Snellen - Linear)      Right Left   Dist [place] 20/20 20/20-1       Tonometry (Tonopen, 9:21 AM)      Right Left   Pressure 13 12       Pupils      Pupils Dark Light Shape React APD   Right PERRL 3  2 Round Brisk None   Left PERRL 3 2 Round Brisk None       Visual Fields (Counting fingers)      Left Right    Full Full       Extraocular Movement      Right Left    Full Full       Neuro/Psych    Oriented x3: Yes   Mood/Affect: Normal       Dilation    Right eye: 2.5% Phenylephrine @ 9:21 AM        Slit Lamp and Fundus Exam    External Exam      Right Left   External Normal Normal       Slit Lamp Exam      Right Left   Lids/Lashes Normal Normal   Conjunctiva/Sclera White and quiet White and quiet   Cornea Clear Clear   Anterior Chamber Deep and quiet Deep and quiet   Iris Round and reactive Round and reactive   Lens Posterior chamber intraocular lens Posterior chamber intraocular lens   Anterior Vitreous Normal Normal       Fundus Exam      Right Left   Posterior Vitreous Posterior vitreous detachment    Disc Normal    C/D Ratio 0.4    Macula Cystoid macular edema, Macular thickening, no exudates    Vessels [place] macular branch retinal vein occlusion.    Periphery Normal           IMAGING AND PROCEDURES  Imaging and Procedures for 10/10/19  OCT, Retina - OU - Both Eyes       Right Eye Quality was good. Scan locations included subfoveal. Central Foveal Thickness: 365. Findings include abnormal foveal contour, cystoid macular edema.   Left Eye Quality was good. Scan locations included subfoveal. Central Foveal Thickness: 289. Progression has improved. Findings include abnormal foveal contour, cystoid macular edema.   Notes [place] macular branch retinal retinal vein occlusion OD, stable, on intravitreal Avastin.  Currently at 5-week interval.  Repeat Avastin OD today                ASSESSMENT/PLAN:  Branch retinal vein occlusion with macular edema of right eye The nature of branch retinal vein occlusion with macular edema was discussed.  The patient was given access to printed information.  The treatment options including continued  observation looking for  spontaneous resolution versus grid laser versus intravitreal Kenalog injection were discussed.  PRIMARY THERAPY CONSISTS of Anti-VEGF Therapies, AVASTIN, LUCENTIS AND EYLEA.  Their usage was discussed to assist in halting the progression of Macular Edema, in order to preserve, protect or improve acuity.  Additionally, at times, limited focal laser therapy is used in the management.  The risks and benefits of all these options were discussed with the patient.  The patient's questions were answered.  OD, will repeat intravitreal Avastin today currently at 5-week interval examination for multiple recurrences in the past.      ICD-10-CM   1. Branch retinal vein occlusion with macular edema of right eye  H34.8310 OCT, Retina - OU - Both Eyes  2. Intermediate stage nonexudative age-related macular degeneration of right eye  H35.3112     1.  OD, intravitreal Avastin today.  Multiple recurrences in the past of macular branch retinal vein occlusion with CME..  We will repeat intravitreal Avastin OD and examination OD in 5 weeks.  2.  OS, history of macular twig branch retinal vein occlusion, with CME.  Examination is scheduled  3.  Ophthalmic Meds Ordered this visit:  No orders of the defined types were placed in this encounter.      No follow-ups on file.  There are no Patient Instructions on file for this visit.   Explained the diagnoses, plan, and follow up with the patient and they expressed understanding.  Patient expressed understanding of the importance of proper follow up care.   Alford Highland Irja Wheless M.D. Diseases & Surgery of the Retina and Vitreous [facility] 10/10/19     Abbreviations: M myopia (nearsighted); A astigmatism; H hyperopia (farsighted); P presbyopia; Mrx spectacle prescription;  CTL contact lenses; OD right eye; OS left eye; OU both eyes  XT exotropia; ET esotropia; PEK punctate epithelial keratitis; PEE punctate epithelial  erosions; DES dry eye syndrome; MGD meibomian gland dysfunction; ATs artificial tears; PFAT's preservative free artificial tears; NSC nuclear sclerotic cataract; PSC posterior subcapsular cataract; ERM epi-retinal membrane; PVD posterior vitreous detachment; RD retinal detachment; DM diabetes mellitus; DR diabetic retinopathy; NPDR non-proliferative diabetic retinopathy; PDR proliferative diabetic retinopathy; CSME clinically significant macular edema; DME diabetic macular edema; dbh dot blot hemorrhages; CWS cotton wool spot; POAG primary open angle glaucoma; C/D cup-to-disc ratio; HVF humphrey visual field; GVF goldmann visual field; OCT optical coherence tomography; IOP intraocular pressure; BRVO Branch retinal vein occlusion; CRVO central retinal vein occlusion; CRAO central retinal artery occlusion; BRAO branch retinal artery occlusion; RT retinal tear; SB scleral buckle; PPV pars plana vitrectomy; VH Vitreous hemorrhage; PRP panretinal laser photocoagulation; IVK intravitreal kenalog; VMT vitreomacular traction; MH Macular hole;  NVD neovascularization of the disc; NVE neovascularization elsewhere; AREDS age related eye disease study; ARMD age related macular degeneration; POAG primary open angle glaucoma; EBMD epithelial/anterior basement membrane dystrophy; ACIOL anterior chamber intraocular lens; IOL intraocular lens; PCIOL posterior chamber intraocular lens; Phaco/IOL phacoemulsification with intraocular lens placement; PRK photorefractive keratectomy; LASIK laser assisted in situ keratomileusis; HTN hypertension; DM diabetes mellitus; COPD chronic obstructive pulmonary disease

## 2019-10-10 NOTE — Assessment & Plan Note (Signed)
The nature of branch retinal vein occlusion with macular edema was discussed.  The patient was given access to printed information.  The treatment options including continued observation looking for spontaneous resolution versus grid laser versus intravitreal Kenalog injection were discussed.  PRIMARY THERAPY CONSISTS of Anti-VEGF Therapies, AVASTIN, LUCENTIS AND EYLEA.  Their usage was discussed to assist in halting the progression of Macular Edema, in order to preserve, protect or improve acuity.  Additionally, at times, limited focal laser therapy is used in the management.  The risks and benefits of all these options were discussed with the patient.  The patient's questions were answered.  OD, will repeat intravitreal Avastin today currently at 5-week interval examination for multiple recurrences in the past.

## 2019-10-18 DIAGNOSIS — G4733 Obstructive sleep apnea (adult) (pediatric): Secondary | ICD-10-CM | POA: Diagnosis not present

## 2019-10-24 ENCOUNTER — Other Ambulatory Visit: Payer: Self-pay

## 2019-10-24 ENCOUNTER — Ambulatory Visit (INDEPENDENT_AMBULATORY_CARE_PROVIDER_SITE_OTHER): Payer: Medicare PPO | Admitting: Ophthalmology

## 2019-10-24 ENCOUNTER — Encounter (INDEPENDENT_AMBULATORY_CARE_PROVIDER_SITE_OTHER): Payer: Self-pay | Admitting: Ophthalmology

## 2019-10-24 DIAGNOSIS — H34832 Tributary (branch) retinal vein occlusion, left eye, with macular edema: Secondary | ICD-10-CM | POA: Diagnosis not present

## 2019-10-24 MED ORDER — BEVACIZUMAB CHEMO INJECTION 1.25MG/0.05ML SYRINGE FOR KALEIDOSCOPE
1.2500 mg | INTRAVITREAL | Status: AC | PRN
  Administered 2019-10-24: 1.25 mg via INTRAVITREAL

## 2019-10-24 NOTE — Assessment & Plan Note (Signed)
CME from BRVO is responsive at 2 and 3 weeks status post injection intravitreal Avastin yet recurrent and threatening vision at 5 weeks.  We will repeat intravitreal injection OS today

## 2019-10-24 NOTE — Progress Notes (Signed)
10/24/2019     CHIEF COMPLAINT Patient presents for Retina Follow Up   HISTORY OF PRESENT ILLNESS: [name] is a 79 y.o. male who presents to the clinic today for:   HPI    Retina Follow Up    Patient presents with  CRVO/BRVO.  In left eye.  This started 5 weeks ago.  Severity is mild.  Duration of 5 weeks.  Since onset it is stable.          Comments    5 Week BRVO F/U OS, poss Avastin OS  Pt denies noticeable changes to [place] OU since last visit. Pt denies ocular pain, flashes of light, or floaters OU.         Last edited by Rockie Neighbours, [place] on 10/24/2019  9:22 AM. (History)      Referring physician: Ernestene Kiel, MD [facility] [place],  [place] [address]  HISTORICAL INFORMATION:   Selected notes from the MEDICAL RECORD NUMBER       CURRENT MEDICATIONS: No current outpatient medications on file. (Ophthalmic Drugs)   No current facility-administered medications for this visit. (Ophthalmic Drugs)   Current Outpatient Medications (Other)  Medication Sig  . aspirin 81 MG tablet Take 81 mg by mouth daily.    . carvedilol (COREG) 12.5 MG tablet Take 1 tablet (12.5 mg total) by mouth 2 (two) times daily. (Patient taking differently: Take 6.25 mg by mouth 2 (two) times daily. )  . carvedilol (COREG) 6.25 MG tablet   . Cholecalciferol (VITAMIN D) 1000 UNITS capsule Take 1,000 Units by mouth daily.    . clotrimazole-betamethasone (LOTRISONE) cream   . Coenzyme Q10 (COQ10) 100 MG CAPS Take 3 capsules by mouth daily.   . Cyanocobalamin (B-12) 2500 MCG TABS Take 1 tablet by mouth daily.  Marland Kitchen levothyroxine (SYNTHROID, LEVOTHROID) 75 MCG tablet Take 1 tablet by mouth daily.  . Misc Natural Products (PROSTATE HEALTH PO) Take 1 tablet by mouth daily.  . montelukast (SINGULAIR) 10 MG tablet Take 10 mg by mouth at bedtime.  . Multiple Vitamin (MULTIVITAMIN) tablet Take 1 tablet by mouth daily.  . nitroGLYCERIN (NITROSTAT) 0.4 MG SL tablet Place 1 tablet under the tongue  every 5 minutes as needed.  . Omega-3 Fatty Acids (FISH OIL) 1200 MG CAPS Take 1,200 mg by mouth 2 (two) times daily.   . Probiotic Product (PROBIOTIC ADVANCED PO) Take 1 capsule by mouth daily.  . rosuvastatin (CRESTOR) 5 MG tablet Take 1 tablet by mouth daily.  . tamsulosin (FLOMAX) 0.4 MG CAPS capsule Take 0.8 mg by mouth daily.    No current facility-administered medications for this visit. (Other)      REVIEW OF SYSTEMS:    ALLERGIES No Known Allergies  PAST MEDICAL HISTORY Past Medical History:  Diagnosis Date  . Anxiety   . Arthritis    neck; limited neck movement  . Chronic rhinitis   . Coronary artery disease   . Depression    no treatment since he retired  . Dyspnea    with exertion  . GERD (gastroesophageal reflux disease)   . Headache    hx subdural hematoma  . Hyperlipidemia   . Hypertension   . Hypothyroidism   . OSA (obstructive sleep apnea)   . Stroke [facility]) ~66yrs ago   mild affects Lt eye  . Subdural hematoma ([facility]) 2004   Past Surgical History:  Procedure Laterality Date  . APPENDECTOMY  1970s  . BRAIN HEMATOMA EVACUATION  ~2005  subdural hematoma  . CARPAL TUNNEL RELEASE Right 05/21/2016   Procedure: CARPAL TUNNEL RELEASE, right;  Surgeon: Cindee Salt, MD;  Location: [facility];  Service: Orthopedics;  Laterality: Right;  FAB  . CATARACT EXTRACTION Left   . CORONARY ARTERY BYPASS GRAFT  ~2006  . Heart bypass  2005  . ROTATOR CUFF REPAIR Right ~4 years    FAMILY HISTORY Family History  Problem Relation Age of Onset  . Heart disease Father   . Leukemia Son   . Stroke Mother     SOCIAL HISTORY Social History   Tobacco Use  . Smoking status: Never Smoker  . Smokeless tobacco: Never Used  Substance Use Topics  . Alcohol use: No  . Drug use: No         OPHTHALMIC EXAM:  Base Eye Exam    Visual Acuity (ETDRS)      Right Left   Dist [place] 20/20 20/20 -2       Tonometry (Tonopen, 9:25 AM)      Right Left    Pressure 10 09       Pupils      Pupils Dark Light Shape React APD   Right PERRL 4 3 Round Brisk None   Left PERRL 4 3 Round Brisk None       Visual Fields (Counting fingers)      Left Right    Full Full       Extraocular Movement      Right Left    Full Full       Neuro/Psych    Oriented x3: Yes   Mood/Affect: Normal       Dilation    Left eye: 1.0% Mydriacyl, 2.5% Phenylephrine @ 9:25 AM        Slit Lamp and Fundus Exam    External Exam      Right Left   External Normal Normal       Slit Lamp Exam      Right Left   Lids/Lashes Normal Normal   Conjunctiva/Sclera White and quiet White and quiet   Cornea Clear Clear   Anterior Chamber Deep and quiet Deep and quiet   Iris Round and reactive Round and reactive   Lens Posterior chamber intraocular lens Posterior chamber intraocular lens   Anterior Vitreous Normal Normal       Fundus Exam      Right Left   Posterior Vitreous  Posterior vitreous detachment   Disc  Pallor 1+   C/D Ratio  0.6   Macula  Microaneurysms, Macular thickening, Hard drusen, no exudates, no atrophy   Vessels  Old macular branch retinal vein occlusion   Periphery  Normal          IMAGING AND PROCEDURES  Imaging and Procedures for 10/24/19  OCT, Retina - OU - Both Eyes       Right Eye Central Foveal Thickness: 339. Progression has improved. Findings include abnormal foveal contour.   Left Eye Central Foveal Thickness: 311. Progression has worsened. Findings include abnormal foveal contour.   Notes OD, some 2 weeks status post injection intravitreal Avastin with improved perifoveal macular edema superiorly.  OS,  5 weeks post injection intravitreal Avastin, recurrent and increased CME temporally.       Intravitreal Injection, Pharmacologic Agent - OS - Left Eye       Time Out 10/24/2019. 10:20 AM. Confirmed correct patient, procedure, site, and patient consented.   Anesthesia Topical anesthesia was used. Anesthetic  medications included Akten 3.5%.   Procedure Preparation included Tobramycin 0.3%, 10% betadine to eyelids.   Injection:  1.25 mg Bevacizumab (AVASTIN) SOLN   NDC: [address]-4, Lot: [id number]   Route: Intravitreal, Site: Left Eye, Waste: 0 mg  Post-op Post injection exam found visual acuity of at least counting fingers. The patient tolerated the procedure well. There were no complications. The patient received written and verbal post procedure care education. Post injection medications were not given.                 ASSESSMENT/PLAN:  Branch retinal vein occlusion with macular edema of left eye CME from BRVO is responsive at 2 and 3 weeks status post injection intravitreal Avastin yet recurrent and threatening vision at 5 weeks.  We will repeat intravitreal injection OS today      ICD-10-CM   1. Branch retinal vein occlusion with macular edema of left eye  H34.8320 OCT, Retina - OU - Both Eyes    Intravitreal Injection, Pharmacologic Agent - OS - Left Eye    Bevacizumab (AVASTIN) SOLN 1.25 mg    1.CME from BRVO is responsive at 2 and 3 weeks status post injection intravitreal Avastin yet recurrent and threatening vision at 5 weeks.  We will repeat intravitreal injection OS today  2.  OD, repeat examination and injection Avastin OD as scheduled  3.  Ophthalmic Meds Ordered this visit:  Meds ordered this encounter  Medications  . Bevacizumab (AVASTIN) SOLN 1.25 mg       Return in about 5 weeks (around 11/28/2019) for dilate, OS, AVASTIN OCT.  There are no Patient Instructions on file for this visit.   Explained the diagnoses, plan, and follow up with the patient and they expressed understanding.  Patient expressed understanding of the importance of proper follow up care.   Alford Highland Amardeep Beckers M.D. Diseases & Surgery of the Retina and Vitreous [facility] 10/24/19     Abbreviations: M myopia (nearsighted); A astigmatism; H hyperopia (farsighted); P  presbyopia; Mrx spectacle prescription;  CTL contact lenses; OD right eye; OS left eye; OU both eyes  XT exotropia; ET esotropia; PEK punctate epithelial keratitis; PEE punctate epithelial erosions; DES dry eye syndrome; MGD meibomian gland dysfunction; ATs artificial tears; PFAT's preservative free artificial tears; NSC nuclear sclerotic cataract; PSC posterior subcapsular cataract; ERM epi-retinal membrane; PVD posterior vitreous detachment; RD retinal detachment; DM diabetes mellitus; DR diabetic retinopathy; NPDR non-proliferative diabetic retinopathy; PDR proliferative diabetic retinopathy; CSME clinically significant macular edema; DME diabetic macular edema; dbh dot blot hemorrhages; CWS cotton wool spot; POAG primary open angle glaucoma; C/D cup-to-disc ratio; HVF humphrey visual field; GVF goldmann visual field; OCT optical coherence tomography; IOP intraocular pressure; BRVO Branch retinal vein occlusion; CRVO central retinal vein occlusion; CRAO central retinal artery occlusion; BRAO branch retinal artery occlusion; RT retinal tear; SB scleral buckle; PPV pars plana vitrectomy; VH Vitreous hemorrhage; PRP panretinal laser photocoagulation; IVK intravitreal kenalog; VMT vitreomacular traction; MH Macular hole;  NVD neovascularization of the disc; NVE neovascularization elsewhere; AREDS age related eye disease study; ARMD age related macular degeneration; POAG primary open angle glaucoma; EBMD epithelial/anterior basement membrane dystrophy; ACIOL anterior chamber intraocular lens; IOL intraocular lens; PCIOL posterior chamber intraocular lens; Phaco/IOL phacoemulsification with intraocular lens placement; PRK photorefractive keratectomy; LASIK laser assisted in situ keratomileusis; HTN hypertension; DM diabetes mellitus; COPD chronic obstructive pulmonary disease

## 2019-11-14 ENCOUNTER — Encounter (INDEPENDENT_AMBULATORY_CARE_PROVIDER_SITE_OTHER): Payer: Self-pay | Admitting: Ophthalmology

## 2019-11-14 ENCOUNTER — Ambulatory Visit (INDEPENDENT_AMBULATORY_CARE_PROVIDER_SITE_OTHER): Payer: Medicare PPO | Admitting: Ophthalmology

## 2019-11-14 ENCOUNTER — Other Ambulatory Visit: Payer: Self-pay

## 2019-11-14 DIAGNOSIS — H34831 Tributary (branch) retinal vein occlusion, right eye, with macular edema: Secondary | ICD-10-CM | POA: Diagnosis not present

## 2019-11-14 MED ORDER — BEVACIZUMAB CHEMO INJECTION 1.25MG/0.05ML SYRINGE FOR KALEIDOSCOPE
1.2500 mg | INTRAVITREAL | Status: AC | PRN
  Administered 2019-11-14: 1.25 mg via INTRAVITREAL

## 2019-11-14 NOTE — Assessment & Plan Note (Signed)
Now 5 weeks status post intravitreal Avastin for chronic recurrent cystoid macular edema from branch retinal vein occlusion.  We will repeat injection OD today to maintain acuity

## 2019-11-14 NOTE — Progress Notes (Signed)
11/14/2019     CHIEF COMPLAINT Patient presents for Retina Follow Up   HISTORY OF PRESENT ILLNESS: [name] is a 79 y.o. male who presents to the clinic today for:   HPI    Retina Follow Up    Patient presents with  CRVO/BRVO.  In right eye.  Duration of 5 weeks.  Since onset it is stable.          Comments    5 week follow up- OCT OU, Poss Avastin OD Patient denies change in vision and overall has no complaints.        Last edited by Berenice Bouton on 11/14/2019  9:15 AM. (History)      Referring physician: [name], MD [address] [place],  [place] [address]  HISTORICAL INFORMATION:   Selected notes from the MEDICAL RECORD NUMBER       CURRENT MEDICATIONS: No current outpatient medications on file. (Ophthalmic Drugs)   No current facility-administered medications for this visit. (Ophthalmic Drugs)   Current Outpatient Medications (Other)  Medication Sig  . aspirin 81 MG tablet Take 81 mg by mouth daily.    . carvedilol (COREG) 12.5 MG tablet Take 1 tablet (12.5 mg total) by mouth 2 (two) times daily. (Patient taking differently: Take 6.25 mg by mouth 2 (two) times daily. )  . carvedilol (COREG) 6.25 MG tablet   . Cholecalciferol (VITAMIN D) 1000 UNITS capsule Take 1,000 Units by mouth daily.    . clotrimazole-betamethasone (LOTRISONE) cream   . Coenzyme Q10 (COQ10) 100 MG CAPS Take 3 capsules by mouth daily.   . Cyanocobalamin (B-12) 2500 MCG TABS Take 1 tablet by mouth daily.  Marland Kitchen levothyroxine (SYNTHROID, LEVOTHROID) 75 MCG tablet Take 1 tablet by mouth daily.  . Misc Natural Products (PROSTATE HEALTH PO) Take 1 tablet by mouth daily.  . montelukast (SINGULAIR) 10 MG tablet Take 10 mg by mouth at bedtime.  . Multiple Vitamin (MULTIVITAMIN) tablet Take 1 tablet by mouth daily.  . nitroGLYCERIN (NITROSTAT) 0.4 MG SL tablet Place 1 tablet under the tongue every 5 minutes as needed.  . Omega-3 Fatty Acids (FISH OIL) 1200 MG CAPS Take 1,200 mg by mouth  2 (two) times daily.   . Probiotic Product (PROBIOTIC ADVANCED PO) Take 1 capsule by mouth daily.  . rosuvastatin (CRESTOR) 5 MG tablet Take 1 tablet by mouth daily.  . tamsulosin (FLOMAX) 0.4 MG CAPS capsule Take 0.8 mg by mouth daily.    No current facility-administered medications for this visit. (Other)      REVIEW OF SYSTEMS:    ALLERGIES No Known Allergies  PAST MEDICAL HISTORY Past Medical History:  Diagnosis Date  . Anxiety   . Arthritis    neck; limited neck movement  . Chronic rhinitis   . Coronary artery disease   . Depression    no treatment since he retired  . Dyspnea    with exertion  . GERD (gastroesophageal reflux disease)   . Headache    hx subdural hematoma  . Hyperlipidemia   . Hypertension   . Hypothyroidism   . OSA (obstructive sleep apnea)   . Stroke [facility]) ~3yrs ago   mild affects Lt eye  . Subdural hematoma ([facility]) 2004   Past Surgical History:  Procedure Laterality Date  . APPENDECTOMY  1970s  . BRAIN HEMATOMA EVACUATION  ~2005   subdural hematoma  . CARPAL TUNNEL RELEASE Right 05/21/2016   Procedure: CARPAL TUNNEL RELEASE, right;  Surgeon: Cindee Salt, MD;  Location: [facility];  Service: Orthopedics;  Laterality: Right;  FAB  . CATARACT EXTRACTION Left   . CORONARY ARTERY BYPASS GRAFT  ~2006  . Heart bypass  2005  . ROTATOR CUFF REPAIR Right ~4 years    FAMILY HISTORY Family History  Problem Relation Age of Onset  . Heart disease Father   . Leukemia Son   . Stroke Mother     SOCIAL HISTORY Social History   Tobacco Use  . Smoking status: Never Smoker  . Smokeless tobacco: Never Used  Vaping Use  . Vaping Use: Never used  Substance Use Topics  . Alcohol use: No  . Drug use: No         OPHTHALMIC EXAM:  Base Eye Exam    Visual Acuity (Snellen - Linear)      Right Left   Dist [place] 20/20 20/20       Tonometry (Tonopen, 9:18 AM)      Right Left   Pressure 14 17       Pupils      Pupils Dark Light  Shape React APD   Right PERRL 3 2 Round Brisk None   Left PERRL 3 2 Round Brisk None       Visual Fields (Counting fingers)      Left Right    Full Full       Extraocular Movement      Right Left    Full Full       Neuro/Psych    Oriented x3: Yes   Mood/Affect: Normal       Dilation    Right eye: 1.0% Mydriacyl, 2.5% Phenylephrine @ 9:18 AM        Slit Lamp and Fundus Exam    External Exam      Right Left   External Normal Normal       Slit Lamp Exam      Right Left   Lids/Lashes Normal Normal   Conjunctiva/Sclera White and quiet White and quiet   Cornea Clear Clear   Anterior Chamber Deep and quiet Deep and quiet   Iris Round and reactive Round and reactive   Lens Posterior chamber intraocular lens Posterior chamber intraocular lens   Anterior Vitreous Normal Normal       Fundus Exam      Right Left   Posterior Vitreous Posterior vitreous detachment    Disc Normal    C/D Ratio 0.4    Macula Cystoid macular edema, Macular thickening, no exudates, Microaneurysms    Vessels [place] macular branch retinal vein occlusion.    Periphery Normal           IMAGING AND PROCEDURES  Imaging and Procedures for 11/14/19  OCT, Retina - OU - Both Eyes       Right Eye Quality was good. Scan locations included subfoveal. Central Foveal Thickness: 358. Findings include cystoid macular edema.   Left Eye Quality was good. Scan locations included subfoveal. Central Foveal Thickness: 292.   Notes OD improved overall yet with chronic Recurrent CME at 5-week interval from branch retinal vein occlusion juxta foveal  OS also much improved after recent intravitreal Avastin  Repeat intravitreal Avastin OD today       Intravitreal Injection, Pharmacologic Agent - OD - Right Eye       Time Out 11/14/2019. 10:15 AM. Confirmed correct patient, procedure, site, and patient consented.   Anesthesia Topical anesthesia was used. Anesthetic medications included Akten 3.5%.  Procedure Preparation included 10% betadine to eyelids, Ofloxacin . A 30 gauge needle was used.   Injection:  1.25 mg Bevacizumab (AVASTIN) SOLN   NDC: [address]-6, Lot: [id number]   Route: Intravitreal, Site: Right Eye, Waste: 0 mg  Post-op Post injection exam found visual acuity of at least counting fingers. The patient tolerated the procedure well. There were no complications. The patient received written and verbal post procedure care education. Post injection medications were not given.                 ASSESSMENT/PLAN:  Branch retinal vein occlusion with macular edema of right eye Now 5 weeks status post intravitreal Avastin for chronic recurrent cystoid macular edema from branch retinal vein occlusion.  We will repeat injection OD today to maintain acuity      ICD-10-CM   1. Branch retinal vein occlusion with macular edema of right eye  H34.8310 OCT, Retina - OU - Both Eyes    Intravitreal Injection, Pharmacologic Agent - OD - Right Eye    Bevacizumab (AVASTIN) SOLN 1.25 mg    1.  2.  3.  Ophthalmic Meds Ordered this visit:  Meds ordered this encounter  Medications  . Bevacizumab (AVASTIN) SOLN 1.25 mg       Return in about 5 weeks (around 12/19/2019) for AVASTIN OCT, OD.  There are no Patient Instructions on file for this visit.   Explained the diagnoses, plan, and follow up with the patient and they expressed understanding.  Patient expressed understanding of the importance of proper follow up care.   Alford Highland Mehmet Scally M.D. Diseases & Surgery of the Retina and Vitreous [facility] 11/14/19     Abbreviations: M myopia (nearsighted); A astigmatism; H hyperopia (farsighted); P presbyopia; Mrx spectacle prescription;  CTL contact lenses; OD right eye; OS left eye; OU both eyes  XT exotropia; ET esotropia; PEK punctate epithelial keratitis; PEE punctate epithelial erosions; DES dry eye syndrome; MGD meibomian gland dysfunction; ATs  artificial tears; PFAT's preservative free artificial tears; NSC nuclear sclerotic cataract; PSC posterior subcapsular cataract; ERM epi-retinal membrane; PVD posterior vitreous detachment; RD retinal detachment; DM diabetes mellitus; DR diabetic retinopathy; NPDR non-proliferative diabetic retinopathy; PDR proliferative diabetic retinopathy; CSME clinically significant macular edema; DME diabetic macular edema; dbh dot blot hemorrhages; CWS cotton wool spot; POAG primary open angle glaucoma; C/D cup-to-disc ratio; HVF humphrey visual field; GVF goldmann visual field; OCT optical coherence tomography; IOP intraocular pressure; BRVO Branch retinal vein occlusion; CRVO central retinal vein occlusion; CRAO central retinal artery occlusion; BRAO branch retinal artery occlusion; RT retinal tear; SB scleral buckle; PPV pars plana vitrectomy; VH Vitreous hemorrhage; PRP panretinal laser photocoagulation; IVK intravitreal kenalog; VMT vitreomacular traction; MH Macular hole;  NVD neovascularization of the disc; NVE neovascularization elsewhere; AREDS age related eye disease study; ARMD age related macular degeneration; POAG primary open angle glaucoma; EBMD epithelial/anterior basement membrane dystrophy; ACIOL anterior chamber intraocular lens; IOL intraocular lens; PCIOL posterior chamber intraocular lens; Phaco/IOL phacoemulsification with intraocular lens placement; PRK photorefractive keratectomy; LASIK laser assisted in situ keratomileusis; HTN hypertension; DM diabetes mellitus; COPD chronic obstructive pulmonary disease

## 2019-11-18 DIAGNOSIS — G4733 Obstructive sleep apnea (adult) (pediatric): Secondary | ICD-10-CM | POA: Diagnosis not present

## 2019-11-28 ENCOUNTER — Encounter (INDEPENDENT_AMBULATORY_CARE_PROVIDER_SITE_OTHER): Payer: Medicare PPO | Admitting: Ophthalmology

## 2019-11-29 ENCOUNTER — Encounter (INDEPENDENT_AMBULATORY_CARE_PROVIDER_SITE_OTHER): Payer: Self-pay | Admitting: Ophthalmology

## 2019-11-29 ENCOUNTER — Ambulatory Visit (INDEPENDENT_AMBULATORY_CARE_PROVIDER_SITE_OTHER): Payer: Medicare PPO | Admitting: Ophthalmology

## 2019-11-29 DIAGNOSIS — H43812 Vitreous degeneration, left eye: Secondary | ICD-10-CM

## 2019-11-29 DIAGNOSIS — H353112 Nonexudative age-related macular degeneration, right eye, intermediate dry stage: Secondary | ICD-10-CM

## 2019-11-29 DIAGNOSIS — H43811 Vitreous degeneration, right eye: Secondary | ICD-10-CM | POA: Diagnosis not present

## 2019-11-29 DIAGNOSIS — H34832 Tributary (branch) retinal vein occlusion, left eye, with macular edema: Secondary | ICD-10-CM | POA: Diagnosis not present

## 2019-11-29 DIAGNOSIS — H34831 Tributary (branch) retinal vein occlusion, right eye, with macular edema: Secondary | ICD-10-CM | POA: Diagnosis not present

## 2019-11-29 MED ORDER — BEVACIZUMAB CHEMO INJECTION 1.25MG/0.05ML SYRINGE FOR KALEIDOSCOPE
1.2500 mg | INTRAVITREAL | Status: AC | PRN
  Administered 2019-11-29: 1.25 mg via INTRAVITREAL

## 2019-11-29 NOTE — Assessment & Plan Note (Signed)
OS with chronic retinal vein occlusion of the macula, improved 2 to 4 weeks status post intravitreal Avastin, always recurrent at 5 weeks.  We will repeat injection Avastin OS today

## 2019-11-29 NOTE — Progress Notes (Signed)
11/29/2019     CHIEF COMPLAINT Patient presents for Retina Follow Up   HISTORY OF PRESENT ILLNESS: [name] is a 79 y.o. male who presents to the clinic today for:   HPI    Retina Follow Up    Patient presents with  CRVO/BRVO.  In left eye.  This started 5 weeks ago.  Severity is moderate.  Duration of 5 weeks.  Since onset it is stable.          Comments    5 Week BRVO F/U OS, poss Avastin OS  Pt reports stable VA OU. Pt c/o occasional watering and aching OU.       Last edited by Ileana Roup, COA on 11/29/2019  9:30 AM. (History)      Referring physician: [name], MD [address] [place],  [place] [address]  HISTORICAL INFORMATION:   Selected notes from the MEDICAL RECORD NUMBER       CURRENT MEDICATIONS: No current outpatient medications on file. (Ophthalmic Drugs)   No current facility-administered medications for this visit. (Ophthalmic Drugs)   Current Outpatient Medications (Other)  Medication Sig  . aspirin 81 MG tablet Take 81 mg by mouth daily.    . carvedilol (COREG) 12.5 MG tablet Take 1 tablet (12.5 mg total) by mouth 2 (two) times daily. (Patient taking differently: Take 6.25 mg by mouth 2 (two) times daily. )  . carvedilol (COREG) 6.25 MG tablet   . Cholecalciferol (VITAMIN D) 1000 UNITS capsule Take 1,000 Units by mouth daily.    . clotrimazole-betamethasone (LOTRISONE) cream   . Coenzyme Q10 (COQ10) 100 MG CAPS Take 3 capsules by mouth daily.   . Cyanocobalamin (B-12) 2500 MCG TABS Take 1 tablet by mouth daily.  Marland Kitchen levothyroxine (SYNTHROID, LEVOTHROID) 75 MCG tablet Take 1 tablet by mouth daily.  . Misc Natural Products (PROSTATE HEALTH PO) Take 1 tablet by mouth daily.  . montelukast (SINGULAIR) 10 MG tablet Take 10 mg by mouth at bedtime.  . Multiple Vitamin (MULTIVITAMIN) tablet Take 1 tablet by mouth daily.  . nitroGLYCERIN (NITROSTAT) 0.4 MG SL tablet Place 1 tablet under the tongue every 5 minutes as needed.  . Omega-3 Fatty  Acids (FISH OIL) 1200 MG CAPS Take 1,200 mg by mouth 2 (two) times daily.   . Probiotic Product (PROBIOTIC ADVANCED PO) Take 1 capsule by mouth daily.  . rosuvastatin (CRESTOR) 5 MG tablet Take 1 tablet by mouth daily.  . tamsulosin (FLOMAX) 0.4 MG CAPS capsule Take 0.8 mg by mouth daily.    No current facility-administered medications for this visit. (Other)      REVIEW OF SYSTEMS:    ALLERGIES No Known Allergies  PAST MEDICAL HISTORY Past Medical History:  Diagnosis Date  . Anxiety   . Arthritis    neck; limited neck movement  . Chronic rhinitis   . Coronary artery disease   . Depression    no treatment since he retired  . Dyspnea    with exertion  . GERD (gastroesophageal reflux disease)   . Headache    hx subdural hematoma  . Hyperlipidemia   . Hypertension   . Hypothyroidism   . OSA (obstructive sleep apnea)   . Stroke [facility]) ~46yrs ago   mild affects Lt eye  . Subdural hematoma (HCC) 2004   Past Surgical History:  Procedure Laterality Date  . APPENDECTOMY  1970s  . BRAIN HEMATOMA EVACUATION  ~2005   subdural hematoma  . CARPAL TUNNEL RELEASE Right 05/21/2016  Procedure: CARPAL TUNNEL RELEASE, right;  Surgeon: Cindee Salt, MD;  Location: [facility];  Service: Orthopedics;  Laterality: Right;  FAB  . CATARACT EXTRACTION Left   . CORONARY ARTERY BYPASS GRAFT  ~2006  . Heart bypass  2005  . ROTATOR CUFF REPAIR Right ~4 years    FAMILY HISTORY Family History  Problem Relation Age of Onset  . Heart disease Father   . Leukemia Son   . Stroke Mother     SOCIAL HISTORY Social History   Tobacco Use  . Smoking status: Never Smoker  . Smokeless tobacco: Never Used  Vaping Use  . Vaping Use: Never used  Substance Use Topics  . Alcohol use: No  . Drug use: No         OPHTHALMIC EXAM:  Base Eye Exam    Visual Acuity (ETDRS)      Right Left   Dist [place] 20/20 20/20 -2       Tonometry (Tonopen, 9:33 AM)      Right Left   Pressure  14 10       Pupils      Pupils Dark Light Shape React APD   Right PERRL 3 2 Round Brisk None   Left PERRL 3 2 Round Brisk None       Visual Fields (Counting fingers)      Left Right    Full Full       Extraocular Movement      Right Left    Full Full       Neuro/Psych    Oriented x3: Yes   Mood/Affect: Normal       Dilation    Left eye: 1.0% Mydriacyl, 2.5% Phenylephrine @ 9:33 AM        Slit Lamp and Fundus Exam    External Exam      Right Left   External Normal Normal       Slit Lamp Exam      Right Left   Lids/Lashes Normal Normal   Conjunctiva/Sclera White and quiet White and quiet   Cornea Clear Clear   Anterior Chamber Deep and quiet Deep and quiet   Iris Round and reactive Round and reactive   Lens Posterior chamber intraocular lens Posterior chamber intraocular lens   Anterior Vitreous Normal Normal       Fundus Exam      Right Left   Posterior Vitreous  Posterior vitreous detachment   Disc  Pallor 1+   C/D Ratio  0.6   Macula  Microaneurysms, Macular thickening, Hard drusen, no exudates, no atrophy   Vessels  Old macular branch retinal vein occlusion   Periphery  Normal          IMAGING AND PROCEDURES  Imaging and Procedures for 12/06/19  OCT, Retina - OU - Both Eyes       Right Eye Quality was good. Scan locations included subfoveal. Central Foveal Thickness: 339. Progression has improved.   Left Eye Quality was good. Scan locations included subfoveal. Central Foveal Thickness: 301. Progression has improved.   Notes OD with residual thickening superiorly from branch retinal vein occlusion, controlled   OS with recurrent thickening and CME BRVO, superotemporal edge of the FAZ overall stable and improved to 5 weeks yet repeat injection Avastin today       Intravitreal Injection, Pharmacologic Agent - OS - Left Eye       Time Out 11/29/2019. 9:50 AM. Confirmed correct patient, procedure, site, and patient  consented.    Anesthesia Topical anesthesia was used. Anesthetic medications included Akten 3.5%.   Procedure Preparation included Ofloxacin , 10% betadine to eyelids. A 30 gauge needle was used.   Injection:  1.25 mg Bevacizumab (AVASTIN) SOLN   NDC: [address]-2, Lot: [id number]   Route: Intravitreal, Site: Left Eye, Waste: 0 mg  Post-op Post injection exam found visual acuity of at least counting fingers. The patient tolerated the procedure well. There were no complications. The patient received written and verbal post procedure care education. Post injection medications were not given.                 ASSESSMENT/PLAN:  Branch retinal vein occlusion with macular edema of left eye OS with chronic retinal vein occlusion of the macula, improved 2 to 4 weeks status post intravitreal Avastin, always recurrent at 5 weeks.  We will repeat injection Avastin OS today      ICD-10-CM   1. Branch retinal vein occlusion with macular edema of left eye  H34.8320 OCT, Retina - OU - Both Eyes    Intravitreal Injection, Pharmacologic Agent - OS - Left Eye    Bevacizumab (AVASTIN) SOLN 1.25 mg  2. Branch retinal vein occlusion with macular edema of right eye  H34.8310   3. Intermediate stage nonexudative age-related macular degeneration of right eye  H35.3112   4. Posterior vitreous detachment of left eye  H43.812   5. Posterior vitreous detachment of right eye  H43.811     1.  Vitreal Avastin OS today repeat examination and possible injection in 5 weeks  2.  OD, repeat examination and probable injection Avastin as scheduled for macular BRVO 3.  Ophthalmic Meds Ordered this visit:  Meds ordered this encounter  Medications  . Bevacizumab (AVASTIN) SOLN 1.25 mg       Return in about 5 weeks (around 01/03/2020) for dilate, OS, AVASTIN OCT.  There are no Patient Instructions on file for this visit.   Explained the diagnoses, plan, and follow up with the patient and they expressed understanding.   Patient expressed understanding of the importance of proper follow up care.   Alford Highland Azaylea Maves M.D. Diseases & Surgery of the Retina and Vitreous [facility] 12/06/19     Abbreviations: M myopia (nearsighted); A astigmatism; H hyperopia (farsighted); P presbyopia; Mrx spectacle prescription;  CTL contact lenses; OD right eye; OS left eye; OU both eyes  XT exotropia; ET esotropia; PEK punctate epithelial keratitis; PEE punctate epithelial erosions; DES dry eye syndrome; MGD meibomian gland dysfunction; ATs artificial tears; PFAT's preservative free artificial tears; NSC nuclear sclerotic cataract; PSC posterior subcapsular cataract; ERM epi-retinal membrane; PVD posterior vitreous detachment; RD retinal detachment; DM diabetes mellitus; DR diabetic retinopathy; NPDR non-proliferative diabetic retinopathy; PDR proliferative diabetic retinopathy; CSME clinically significant macular edema; DME diabetic macular edema; dbh dot blot hemorrhages; CWS cotton wool spot; POAG primary open angle glaucoma; C/D cup-to-disc ratio; HVF humphrey visual field; GVF goldmann visual field; OCT optical coherence tomography; IOP intraocular pressure; BRVO Branch retinal vein occlusion; CRVO central retinal vein occlusion; CRAO central retinal artery occlusion; BRAO branch retinal artery occlusion; RT retinal tear; SB scleral buckle; PPV pars plana vitrectomy; VH Vitreous hemorrhage; PRP panretinal laser photocoagulation; IVK intravitreal kenalog; VMT vitreomacular traction; MH Macular hole;  NVD neovascularization of the disc; NVE neovascularization elsewhere; AREDS age related eye disease study; ARMD age related macular degeneration; POAG primary open angle glaucoma; EBMD epithelial/anterior basement membrane dystrophy; ACIOL anterior chamber intraocular lens; IOL intraocular lens;  PCIOL posterior chamber intraocular lens; Phaco/IOL phacoemulsification with intraocular lens placement; PRK photorefractive  keratectomy; LASIK laser assisted in situ keratomileusis; HTN hypertension; DM diabetes mellitus; COPD chronic obstructive pulmonary disease

## 2019-12-14 DIAGNOSIS — R05 Cough: Secondary | ICD-10-CM | POA: Diagnosis not present

## 2019-12-14 DIAGNOSIS — J069 Acute upper respiratory infection, unspecified: Secondary | ICD-10-CM | POA: Diagnosis not present

## 2019-12-14 DIAGNOSIS — R0981 Nasal congestion: Secondary | ICD-10-CM | POA: Diagnosis not present

## 2019-12-18 DIAGNOSIS — G4733 Obstructive sleep apnea (adult) (pediatric): Secondary | ICD-10-CM | POA: Diagnosis not present

## 2019-12-19 ENCOUNTER — Other Ambulatory Visit: Payer: Self-pay

## 2019-12-19 ENCOUNTER — Encounter (INDEPENDENT_AMBULATORY_CARE_PROVIDER_SITE_OTHER): Payer: Medicare PPO | Admitting: Ophthalmology

## 2019-12-19 ENCOUNTER — Encounter (INDEPENDENT_AMBULATORY_CARE_PROVIDER_SITE_OTHER): Payer: Self-pay | Admitting: Ophthalmology

## 2019-12-19 ENCOUNTER — Ambulatory Visit (INDEPENDENT_AMBULATORY_CARE_PROVIDER_SITE_OTHER): Payer: Medicare PPO | Admitting: Ophthalmology

## 2019-12-19 DIAGNOSIS — H34832 Tributary (branch) retinal vein occlusion, left eye, with macular edema: Secondary | ICD-10-CM | POA: Diagnosis not present

## 2019-12-19 DIAGNOSIS — H34831 Tributary (branch) retinal vein occlusion, right eye, with macular edema: Secondary | ICD-10-CM

## 2019-12-19 DIAGNOSIS — H34833 Tributary (branch) retinal vein occlusion, bilateral, with macular edema: Secondary | ICD-10-CM | POA: Diagnosis not present

## 2019-12-19 MED ORDER — BEVACIZUMAB CHEMO INJECTION 1.25MG/0.05ML SYRINGE FOR KALEIDOSCOPE
1.2500 mg | INTRAVITREAL | Status: AC | PRN
  Administered 2019-12-19: 1.25 mg via INTRAVITREAL

## 2019-12-19 NOTE — Assessment & Plan Note (Signed)
2 weeks post recent injection intravitreal Avastin OS, less CME

## 2019-12-19 NOTE — Assessment & Plan Note (Signed)
Patient continues to have recurrent CME at 5 weeks after intravitreal Avastin.  We will repeat injection OD today and examination in 5 weeks

## 2019-12-19 NOTE — Progress Notes (Signed)
12/19/2019     CHIEF COMPLAINT Patient presents for Retina Follow Up   HISTORY OF PRESENT ILLNESS: [name] is a 79 y.o. male who presents to the clinic today for:   HPI    Retina Follow Up    Patient presents with  CRVO/BRVO.  In right eye.  This started 5 weeks ago.  Duration of 5 weeks.  Since onset it is stable.          Comments    5 week f/u dilated exam, OCT(MAC) and possible Avastin OD today.  Pt denies noticeable changes to [place] OU since last visit. Pt denies ocular pain, flashes of light, or floaters OU.         Last edited by Melburn Popper, COA on 12/19/2019  9:42 AM. (History)      Referring physician: Ernestene Kiel, MD [address] [place],  [place] [address]  HISTORICAL INFORMATION:   Selected notes from the MEDICAL RECORD NUMBER       CURRENT MEDICATIONS: No current outpatient medications on file. (Ophthalmic Drugs)   No current facility-administered medications for this visit. (Ophthalmic Drugs)   Current Outpatient Medications (Other)  Medication Sig  . aspirin 81 MG tablet Take 81 mg by mouth daily.    . carvedilol (COREG) 12.5 MG tablet Take 1 tablet (12.5 mg total) by mouth 2 (two) times daily. (Patient taking differently: Take 6.25 mg by mouth 2 (two) times daily. )  . carvedilol (COREG) 6.25 MG tablet   . Cholecalciferol (VITAMIN D) 1000 UNITS capsule Take 1,000 Units by mouth daily.    . clotrimazole-betamethasone (LOTRISONE) cream   . Coenzyme Q10 (COQ10) 100 MG CAPS Take 3 capsules by mouth daily.   . Cyanocobalamin (B-12) 2500 MCG TABS Take 1 tablet by mouth daily.  Marland Kitchen levothyroxine (SYNTHROID, LEVOTHROID) 75 MCG tablet Take 1 tablet by mouth daily.  . Misc Natural Products (PROSTATE HEALTH PO) Take 1 tablet by mouth daily.  . montelukast (SINGULAIR) 10 MG tablet Take 10 mg by mouth at bedtime.  . Multiple Vitamin (MULTIVITAMIN) tablet Take 1 tablet by mouth daily.  . nitroGLYCERIN (NITROSTAT) 0.4 MG SL tablet Place 1 tablet  under the tongue every 5 minutes as needed.  . Omega-3 Fatty Acids (FISH OIL) 1200 MG CAPS Take 1,200 mg by mouth 2 (two) times daily.   . Probiotic Product (PROBIOTIC ADVANCED PO) Take 1 capsule by mouth daily.  . rosuvastatin (CRESTOR) 5 MG tablet Take 1 tablet by mouth daily.  . tamsulosin (FLOMAX) 0.4 MG CAPS capsule Take 0.8 mg by mouth daily.    No current facility-administered medications for this visit. (Other)      REVIEW OF SYSTEMS:    ALLERGIES No Known Allergies  PAST MEDICAL HISTORY Past Medical History:  Diagnosis Date  . Anxiety   . Arthritis    neck; limited neck movement  . Chronic rhinitis   . Coronary artery disease   . Depression    no treatment since he retired  . Dyspnea    with exertion  . GERD (gastroesophageal reflux disease)   . Headache    hx subdural hematoma  . Hyperlipidemia   . Hypertension   . Hypothyroidism   . OSA (obstructive sleep apnea)   . Stroke [facility]) ~51yr ago   mild affects Lt eye  . Subdural hematoma ([facility] 2004   Past Surgical History:  Procedure Laterality Date  . APPENDECTOMY  1970s  . BRAIN HEMATOMA EVACUATION  ~2005  subdural hematoma  . CARPAL TUNNEL RELEASE Right 05/21/2016   Procedure: CARPAL TUNNEL RELEASE, right;  Surgeon: Daryll Brod, MD;  Location: [facility];  Service: Orthopedics;  Laterality: Right;  FAB  . CATARACT EXTRACTION Left   . CORONARY ARTERY BYPASS GRAFT  ~2006  . Heart bypass  2005  . ROTATOR CUFF REPAIR Right ~4 years    FAMILY HISTORY Family History  Problem Relation Age of Onset  . Heart disease Father   . Leukemia Son   . Stroke Mother     SOCIAL HISTORY Social History   Tobacco Use  . Smoking status: Never Smoker  . Smokeless tobacco: Never Used  Vaping Use  . Vaping Use: Never used  Substance Use Topics  . Alcohol use: No  . Drug use: No         OPHTHALMIC EXAM:  Base Eye Exam    Visual Acuity (ETDRS)      Right Left   Dist [place] 20/20 20/20        Tonometry (Tonopen, 9:45 AM)      Right Left   Pressure 13 16       Pupils      Pupils Dark Light Shape React APD   Right PERRL 3 2 Round Brisk None   Left PERRL 3 2 Round Brisk None       Visual Fields (Counting fingers)      Left Right    Full Full       Extraocular Movement      Right Left    Full Full       Neuro/Psych    Oriented x3: Yes   Mood/Affect: Normal       Dilation    Right eye: 1.0% Mydriacyl, 2.5% Phenylephrine @ 9:45 AM        Slit Lamp and Fundus Exam    External Exam      Right Left   External Normal Normal       Slit Lamp Exam      Right Left   Lids/Lashes Normal Normal   Conjunctiva/Sclera White and quiet White and quiet   Cornea Clear Clear   Anterior Chamber Deep and quiet Deep and quiet   Iris Round and reactive Round and reactive   Lens Posterior chamber intraocular lens Posterior chamber intraocular lens   Anterior Vitreous Normal Normal       Fundus Exam      Right Left   Posterior Vitreous Posterior vitreous detachment    Disc Normal    C/D Ratio 0.35    Macula Cystoid macular edema, Macular thickening, no exudates, Microaneurysms    Vessels [place] macular branch retinal vein occlusion.    Periphery Normal           IMAGING AND PROCEDURES  Imaging and Procedures for 12/19/19  OCT, Retina - OU - Both Eyes       Right Eye Quality was good. Scan locations included subfoveal. Central Foveal Thickness: 388. Progression has worsened. Findings include cystoid macular edema.   Left Eye Quality was good. Scan locations included subfoveal. Central Foveal Thickness: 290. Progression has improved. Findings include cystoid macular edema.   Notes OD currently at 5 weeks and post intravitreal Avastin.  Typically improves at 2 to 3 weeks post injection, now recurrent and worse at 5 weeks.  OS after recent intravitreal Avastin, less CME.         Intravitreal Injection, Pharmacologic Agent - OD - Right Eye  Time  Out 12/19/2019. 10:47 AM. Confirmed correct patient, procedure, site, and patient consented.   Anesthesia Topical anesthesia was used. Anesthetic medications included Akten 3.5%.   Procedure Preparation included 5% betadine to ocular surface, 10% betadine to eyelids, Ofloxacin . A 30 gauge needle was used.   Injection:  1.25 mg Bevacizumab (AVASTIN) SOLN   NDC: [address]-6, Lot: [id number]   Route: Intravitreal, Site: Right Eye, Waste: 0 mg  Post-op Post injection exam found visual acuity of at least counting fingers. The patient tolerated the procedure well. There were no complications. The patient received written and verbal post procedure care education. Post injection medications were not given.                 ASSESSMENT/PLAN:  Branch retinal vein occlusion with macular edema of right eye Patient continues to have recurrent CME at 5 weeks after intravitreal Avastin.  We will repeat injection OD today and examination in 5 weeks  Branch retinal vein occlusion with macular edema of left eye 2 weeks post recent injection intravitreal Avastin OS, less CME      ICD-10-CM   1. Branch retinal vein occlusion with macular edema of right eye  H34.8310 OCT, Retina - OU - Both Eyes    Intravitreal Injection, Pharmacologic Agent - OD - Right Eye    Bevacizumab (AVASTIN) SOLN 1.25 mg  2. Branch retinal vein occlusion with macular edema of left eye  H34.8320     1.  2.  3.  Ophthalmic Meds Ordered this visit:  Meds ordered this encounter  Medications  . Bevacizumab (AVASTIN) SOLN 1.25 mg       Return in about 5 weeks (around 01/23/2020) for dilate, OD, AVASTIN OCT.  There are no Patient Instructions on file for this visit.   Explained the diagnoses, plan, and follow up with the patient and they expressed understanding.  Patient expressed understanding of the importance of proper follow up care.   Clent Demark Izel Eisenhardt M.D. Diseases & Surgery of the Retina and Vitreous [facility] [place] [facility] 12/19/19     Abbreviations: M myopia (nearsighted); A astigmatism; H hyperopia (farsighted); P presbyopia; Mrx spectacle prescription;  CTL contact lenses; OD right eye; OS left eye; OU both eyes  XT exotropia; ET esotropia; PEK punctate epithelial keratitis; PEE punctate epithelial erosions; DES dry eye syndrome; MGD meibomian gland dysfunction; ATs artificial tears; PFAT's preservative free artificial tears; [place] nuclear sclerotic cataract; PSC posterior subcapsular cataract; ERM epi-retinal membrane; PVD posterior vitreous detachment; RD retinal detachment; DM diabetes mellitus; DR diabetic retinopathy; NPDR non-proliferative diabetic retinopathy; PDR proliferative diabetic retinopathy; CSME clinically significant macular edema; DME diabetic macular edema; dbh dot blot hemorrhages; CWS cotton wool spot; POAG primary open angle glaucoma; C/D cup-to-disc ratio; HVF humphrey visual field; GVF goldmann visual field; OCT optical coherence tomography; IOP intraocular pressure; BRVO Branch retinal vein occlusion; CRVO central retinal vein occlusion; CRAO central retinal artery occlusion; BRAO branch retinal artery occlusion; RT retinal tear; SB scleral buckle; PPV pars plana vitrectomy; VH Vitreous hemorrhage; PRP panretinal laser photocoagulation; IVK intravitreal kenalog; VMT vitreomacular traction; MH Macular hole;  NVD neovascularization of the disc; NVE neovascularization elsewhere; AREDS age related eye disease study; ARMD age related macular degeneration; POAG primary open angle glaucoma; EBMD epithelial/anterior basement membrane dystrophy; ACIOL anterior chamber intraocular lens; IOL intraocular lens; PCIOL posterior chamber intraocular lens; Phaco/IOL phacoemulsification with intraocular lens placement; [place] photorefractive keratectomy; LASIK laser assisted in situ keratomileusis; HTN hypertension; DM diabetes mellitus; COPD chronic obstructive pulmonary disease

## 2019-12-20 ENCOUNTER — Encounter (INDEPENDENT_AMBULATORY_CARE_PROVIDER_SITE_OTHER): Payer: Medicare PPO | Admitting: Ophthalmology

## 2020-01-02 ENCOUNTER — Ambulatory Visit (INDEPENDENT_AMBULATORY_CARE_PROVIDER_SITE_OTHER): Payer: Medicare PPO | Admitting: Ophthalmology

## 2020-01-02 ENCOUNTER — Other Ambulatory Visit: Payer: Self-pay

## 2020-01-02 ENCOUNTER — Encounter (INDEPENDENT_AMBULATORY_CARE_PROVIDER_SITE_OTHER): Payer: Self-pay | Admitting: Ophthalmology

## 2020-01-02 DIAGNOSIS — H34833 Tributary (branch) retinal vein occlusion, bilateral, with macular edema: Secondary | ICD-10-CM | POA: Diagnosis not present

## 2020-01-02 DIAGNOSIS — H34832 Tributary (branch) retinal vein occlusion, left eye, with macular edema: Secondary | ICD-10-CM | POA: Diagnosis not present

## 2020-01-02 DIAGNOSIS — H34831 Tributary (branch) retinal vein occlusion, right eye, with macular edema: Secondary | ICD-10-CM

## 2020-01-02 MED ORDER — BEVACIZUMAB CHEMO INJECTION 1.25MG/0.05ML SYRINGE FOR KALEIDOSCOPE
1.2500 mg | INTRAVITREAL | Status: AC | PRN
  Administered 2020-01-02: 1.25 mg via INTRAVITREAL

## 2020-01-02 NOTE — Assessment & Plan Note (Signed)
OS, chronically active temporal BRVO, improved after intravitreal Avastin, yet still active in 6 weeks we will repeat today to prevent progression into the fovea.  Incidentally patient does continue on CPAP use

## 2020-01-02 NOTE — Progress Notes (Signed)
01/02/2020     CHIEF COMPLAINT Patient presents for Retina Follow Up   HISTORY OF PRESENT ILLNESS: [name] is a 79 y.o. male who presents to the clinic today for:   HPI    Retina Follow Up    Patient presents with  CRVO/BRVO.  In left eye.  Severity is moderate.  Duration of 5 weeks.  Since onset it is stable.  I, the attending physician,  performed the HPI with the patient and updated documentation appropriately.          Comments    5 Week BRVO f\u OS. Possible Avastin OS. OCT  Pt c/o OS vision being weaker.       Last edited by Elyse Jarvis on 01/02/2020  9:17 AM. (History)      Referring physician: [name], MD [address] [place],  [place] [address]  HISTORICAL INFORMATION:   Selected notes from the MEDICAL RECORD NUMBER       CURRENT MEDICATIONS: No current outpatient medications on file. (Ophthalmic Drugs)   No current facility-administered medications for this visit. (Ophthalmic Drugs)   Current Outpatient Medications (Other)  Medication Sig  . aspirin 81 MG tablet Take 81 mg by mouth daily.    . carvedilol (COREG) 12.5 MG tablet Take 1 tablet (12.5 mg total) by mouth 2 (two) times daily. (Patient taking differently: Take 6.25 mg by mouth 2 (two) times daily. )  . carvedilol (COREG) 6.25 MG tablet   . Cholecalciferol (VITAMIN D) 1000 UNITS capsule Take 1,000 Units by mouth daily.    . clotrimazole-betamethasone (LOTRISONE) cream  (Patient not taking: Reported on 01/02/2020)  . Coenzyme Q10 (COQ10) 100 MG CAPS Take 3 capsules by mouth daily.   . Cyanocobalamin (B-12) 2500 MCG TABS Take 1 tablet by mouth daily.  Marland Kitchen levothyroxine (SYNTHROID, LEVOTHROID) 75 MCG tablet Take 1 tablet by mouth daily.  . Misc Natural Products (PROSTATE HEALTH PO) Take 1 tablet by mouth daily. (Patient not taking: Reported on 01/02/2020)  . montelukast (SINGULAIR) 10 MG tablet Take 10 mg by mouth at bedtime. (Patient not taking: Reported on 01/02/2020)  . Multiple  Vitamin (MULTIVITAMIN) tablet Take 1 tablet by mouth daily.  . nitroGLYCERIN (NITROSTAT) 0.4 MG SL tablet Place 1 tablet under the tongue every 5 minutes as needed.  . Omega-3 Fatty Acids (FISH OIL) 1200 MG CAPS Take 1,200 mg by mouth 2 (two) times daily.   . Probiotic Product (PROBIOTIC ADVANCED PO) Take 1 capsule by mouth daily. (Patient not taking: Reported on 01/02/2020)  . rosuvastatin (CRESTOR) 5 MG tablet Take 1 tablet by mouth daily.  . tamsulosin (FLOMAX) 0.4 MG CAPS capsule Take 0.8 mg by mouth daily.    No current facility-administered medications for this visit. (Other)      REVIEW OF SYSTEMS:    ALLERGIES No Known Allergies  PAST MEDICAL HISTORY Past Medical History:  Diagnosis Date  . Anxiety   . Arthritis    neck; limited neck movement  . Chronic rhinitis   . Coronary artery disease   . Depression    no treatment since he retired  . Dyspnea    with exertion  . GERD (gastroesophageal reflux disease)   . Headache    hx subdural hematoma  . Hyperlipidemia   . Hypertension   . Hypothyroidism   . OSA (obstructive sleep apnea)   . Stroke [facility] ~53yrs ago   mild affects Lt eye  . Subdural hematoma (HCC) 2004   Past Surgical  History:  Procedure Laterality Date  . APPENDECTOMY  1970s  . BRAIN HEMATOMA EVACUATION  ~2005   subdural hematoma  . CARPAL TUNNEL RELEASE Right 05/21/2016   Procedure: CARPAL TUNNEL RELEASE, right;  Surgeon: Cindee Salt, MD;  Location: [facility];  Service: Orthopedics;  Laterality: Right;  FAB  . CATARACT EXTRACTION Left   . CORONARY ARTERY BYPASS GRAFT  ~2006  . Heart bypass  2005  . ROTATOR CUFF REPAIR Right ~4 years    FAMILY HISTORY Family History  Problem Relation Age of Onset  . Heart disease Father   . Leukemia Son   . Stroke Mother     SOCIAL HISTORY Social History   Tobacco Use  . Smoking status: Never Smoker  . Smokeless tobacco: Never Used  Vaping Use  . Vaping Use: Never used  Substance Use  Topics  . Alcohol use: No  . Drug use: No         OPHTHALMIC EXAM:  Base Eye Exam    Visual Acuity (Snellen - Linear)      Right Left   Dist [place] 20/20 20       Tonometry (Tonopen, 9:22 AM)      Right Left   Pressure 13 14       Pupils      Pupils Dark Light Shape React APD   Right PERRL 3 2 Round Brisk None   Left PERRL 3 2 Round Brisk None       Visual Fields (Counting fingers)      Left Right    Full Full       Neuro/Psych    Oriented x3: Yes   Mood/Affect: Normal       Dilation    Left eye: 1.0% Mydriacyl, 2.5% Phenylephrine @ 9:22 AM        Slit Lamp and Fundus Exam    External Exam      Right Left   External Normal Normal       Slit Lamp Exam      Right Left   Lids/Lashes Normal Normal   Conjunctiva/Sclera White and quiet White and quiet   Cornea Clear Clear   Anterior Chamber Deep and quiet Deep and quiet   Iris Round and reactive Round and reactive   Lens Posterior chamber intraocular lens Posterior chamber intraocular lens   Anterior Vitreous Normal Normal       Fundus Exam      Right Left   Posterior Vitreous  Posterior vitreous detachment   Disc  Pallor 1+   C/D Ratio  0.6   Macula  Microaneurysms, Macular thickening, Hard drusen, no exudates, no atrophy   Vessels  Old macular branch retinal vein occlusion   Periphery  Normal          IMAGING AND PROCEDURES  Imaging and Procedures for 01/02/20  OCT, Retina - OU - Both Eyes       Right Eye Quality was good. Scan locations included subfoveal. Central Foveal Thickness: 354. Progression has improved. Findings include abnormal foveal contour, cystoid macular edema.   Left Eye Quality was good. Scan locations included subfoveal. Central Foveal Thickness: 300. Progression has worsened. Findings include abnormal foveal contour, cystoid macular edema.   Notes Less CME OS overall yet still active at 6-week interval post Avastin for branch retinal vein occlusion.  OD similar  findings after Intravitreal Avastin, will follow up as scheduled       Intravitreal Injection, Pharmacologic Agent - OS -  Left Eye       Time Out 01/02/2020. 9:50 AM. Confirmed correct patient, procedure, site, and patient consented.   Anesthesia Topical anesthesia was used.   Procedure Preparation included Ofloxacin , 10% betadine to eyelids, 5% betadine to ocular surface. A 30 gauge needle was used.   Injection:  1.25 mg Bevacizumab (AVASTIN) SOLN   NDC: [address]-6, Lot: [id number]   Route: Intravitreal, Site: Left Eye, Waste: 0 mg  Post-op Post injection exam found visual acuity of at least counting fingers. The patient tolerated the procedure well. There were no complications. The patient received written and verbal post procedure care education. Post injection medications were not given.                 ASSESSMENT/PLAN:  Branch retinal vein occlusion with macular edema of left eye OS, chronically active temporal BRVO, improved after intravitreal Avastin, yet still active in 6 weeks we will repeat today to prevent progression into the fovea.  Incidentally patient does continue on CPAP use      ICD-10-CM   1. Branch retinal vein occlusion with macular edema of left eye  H34.8320 OCT, Retina - OU - Both Eyes    Intravitreal Injection, Pharmacologic Agent - OS - Left Eye    Bevacizumab (AVASTIN) SOLN 1.25 mg  2. Branch retinal vein occlusion with macular edema of right eye  H34.8310     1.  Repeat intravitreal Avastin OS today, maintain 6-week follow-up interval OS  2.  OD follow-up as scheduled 3.  Ophthalmic Meds Ordered this visit:  Meds ordered this encounter  Medications  . Bevacizumab (AVASTIN) SOLN 1.25 mg       Return in about 6 weeks (around 02/13/2020) for dilate, OS, AVASTIN OCT.  There are no Patient Instructions on file for this visit.   Explained the diagnoses, plan, and follow up with the patient and they expressed understanding.  Patient  expressed understanding of the importance of proper follow up care.   Alford Highland Alfonso Shackett M.D. Diseases & Surgery of the Retina and Vitreous [facility] 01/02/20     Abbreviations: M myopia (nearsighted); A astigmatism; H hyperopia (farsighted); P presbyopia; Mrx spectacle prescription;  CTL contact lenses; OD right eye; OS left eye; OU both eyes  XT exotropia; ET esotropia; PEK punctate epithelial keratitis; PEE punctate epithelial erosions; DES dry eye syndrome; MGD meibomian gland dysfunction; ATs artificial tears; PFAT's preservative free artificial tears; NSC nuclear sclerotic cataract; PSC posterior subcapsular cataract; ERM epi-retinal membrane; PVD posterior vitreous detachment; RD retinal detachment; DM diabetes mellitus; DR diabetic retinopathy; NPDR non-proliferative diabetic retinopathy; PDR proliferative diabetic retinopathy; CSME clinically significant macular edema; DME diabetic macular edema; dbh dot blot hemorrhages; CWS cotton wool spot; POAG primary open angle glaucoma; C/D cup-to-disc ratio; HVF humphrey visual field; GVF goldmann visual field; OCT optical coherence tomography; IOP intraocular pressure; BRVO Branch retinal vein occlusion; CRVO central retinal vein occlusion; CRAO central retinal artery occlusion; BRAO branch retinal artery occlusion; RT retinal tear; SB scleral buckle; PPV pars plana vitrectomy; VH Vitreous hemorrhage; PRP panretinal laser photocoagulation; IVK intravitreal kenalog; VMT vitreomacular traction; MH Macular hole;  NVD neovascularization of the disc; NVE neovascularization elsewhere; AREDS age related eye disease study; ARMD age related macular degeneration; POAG primary open angle glaucoma; EBMD epithelial/anterior basement membrane dystrophy; ACIOL anterior chamber intraocular lens; IOL intraocular lens; PCIOL posterior chamber intraocular lens; Phaco/IOL phacoemulsification with intraocular lens placement; PRK photorefractive keratectomy;  LASIK laser assisted in situ keratomileusis; HTN hypertension; DM diabetes  mellitus; COPD chronic obstructive pulmonary disease

## 2020-01-03 ENCOUNTER — Encounter (INDEPENDENT_AMBULATORY_CARE_PROVIDER_SITE_OTHER): Payer: Medicare PPO | Admitting: Ophthalmology

## 2020-01-12 DIAGNOSIS — E039 Hypothyroidism, unspecified: Secondary | ICD-10-CM | POA: Diagnosis not present

## 2020-01-12 DIAGNOSIS — Z79899 Other long term (current) drug therapy: Secondary | ICD-10-CM | POA: Diagnosis not present

## 2020-01-12 DIAGNOSIS — K219 Gastro-esophageal reflux disease without esophagitis: Secondary | ICD-10-CM | POA: Diagnosis not present

## 2020-01-12 DIAGNOSIS — N1832 Chronic kidney disease, stage 3b: Secondary | ICD-10-CM | POA: Diagnosis not present

## 2020-01-12 DIAGNOSIS — I10 Essential (primary) hypertension: Secondary | ICD-10-CM | POA: Diagnosis not present

## 2020-01-12 DIAGNOSIS — R7309 Other abnormal glucose: Secondary | ICD-10-CM | POA: Diagnosis not present

## 2020-01-12 DIAGNOSIS — I25119 Atherosclerotic heart disease of native coronary artery with unspecified angina pectoris: Secondary | ICD-10-CM | POA: Diagnosis not present

## 2020-01-12 DIAGNOSIS — E785 Hyperlipidemia, unspecified: Secondary | ICD-10-CM | POA: Diagnosis not present

## 2020-01-18 DIAGNOSIS — G4733 Obstructive sleep apnea (adult) (pediatric): Secondary | ICD-10-CM | POA: Diagnosis not present

## 2020-01-23 ENCOUNTER — Encounter (INDEPENDENT_AMBULATORY_CARE_PROVIDER_SITE_OTHER): Payer: Medicare PPO | Admitting: Ophthalmology

## 2020-01-23 ENCOUNTER — Other Ambulatory Visit: Payer: Self-pay

## 2020-01-23 ENCOUNTER — Encounter (INDEPENDENT_AMBULATORY_CARE_PROVIDER_SITE_OTHER): Payer: Self-pay | Admitting: Ophthalmology

## 2020-01-23 ENCOUNTER — Ambulatory Visit (INDEPENDENT_AMBULATORY_CARE_PROVIDER_SITE_OTHER): Payer: Medicare PPO | Admitting: Ophthalmology

## 2020-01-23 DIAGNOSIS — H34831 Tributary (branch) retinal vein occlusion, right eye, with macular edema: Secondary | ICD-10-CM

## 2020-01-23 MED ORDER — BEVACIZUMAB CHEMO INJECTION 1.25MG/0.05ML SYRINGE FOR KALEIDOSCOPE
1.2500 mg | INTRAVITREAL | Status: AC | PRN
  Administered 2020-01-23: 1.25 mg via INTRAVITREAL

## 2020-01-23 NOTE — Assessment & Plan Note (Signed)
The nature of branch retinal vein occlusion with macular edema was discussed.  The patient was given access to printed information.  The treatment options including continued observation looking for spontaneous resolution versus grid laser versus intravitreal Kenalog injection were discussed.  PRIMARY THERAPY CONSISTS of Anti-VEGF Therapies, AVASTIN, LUCENTIS AND EYLEA.  Their usage was discussed to assist in halting the progression of Macular Edema, in order to preserve, protect or improve acuity.  Additionally, at times, limited focal laser therapy is used in the management.  The risks and benefits of all these options were discussed with the patient.  The patient's questions were answered. 

## 2020-01-23 NOTE — Patient Instructions (Signed)
Patient instructed to call the office promptly should new difficulties arise, pain or decline in vision in either eye

## 2020-01-23 NOTE — Progress Notes (Signed)
01/23/2020     CHIEF COMPLAINT Patient presents for Retina Follow Up   HISTORY OF PRESENT ILLNESS: [name] is a 79 y.o. male who presents to the clinic today for:   HPI    Retina Follow Up    Patient presents with  CRVO/BRVO.  In right eye.  Severity is moderate.  Duration of 5 weeks.  Since onset it is stable.  I, the attending physician,  performed the HPI with the patient and updated documentation appropriately.          Comments    5 Week BRVO f\u OD. Possible Avastin OD. OCT  Pt states no changes in vision. Denies any complaints.       Last edited by [name] on 01/23/2020  9:27 AM. (History)      Referring physician: [name], MD [address] [place],  [place] [address]  HISTORICAL INFORMATION:   Selected notes from the MEDICAL RECORD NUMBER       CURRENT MEDICATIONS: No current outpatient medications on file. (Ophthalmic Drugs)   No current facility-administered medications for this visit. (Ophthalmic Drugs)   Current Outpatient Medications (Other)  Medication Sig  . aspirin 81 MG tablet Take 81 mg by mouth daily.    . carvedilol (COREG) 12.5 MG tablet Take 1 tablet (12.5 mg total) by mouth 2 (two) times daily. (Patient taking differently: Take 6.25 mg by mouth 2 (two) times daily. )  . carvedilol (COREG) 6.25 MG tablet   . Cholecalciferol (VITAMIN D) 1000 UNITS capsule Take 1,000 Units by mouth daily.    . clotrimazole-betamethasone (LOTRISONE) cream  (Patient not taking: Reported on 01/02/2020)  . Coenzyme Q10 (COQ10) 100 MG CAPS Take 3 capsules by mouth daily.   . Cyanocobalamin (B-12) 2500 MCG TABS Take 1 tablet by mouth daily.  Marland Kitchen levothyroxine (SYNTHROID, LEVOTHROID) 75 MCG tablet Take 1 tablet by mouth daily.  . Misc Natural Products (PROSTATE HEALTH PO) Take 1 tablet by mouth daily. (Patient not taking: Reported on 01/02/2020)  . montelukast (SINGULAIR) 10 MG tablet Take 10 mg by mouth at bedtime. (Patient not taking: Reported on  01/02/2020)  . Multiple Vitamin (MULTIVITAMIN) tablet Take 1 tablet by mouth daily.  . nitroGLYCERIN (NITROSTAT) 0.4 MG SL tablet Place 1 tablet under the tongue every 5 minutes as needed.  . Omega-3 Fatty Acids (FISH OIL) 1200 MG CAPS Take 1,200 mg by mouth 2 (two) times daily.   . Probiotic Product (PROBIOTIC ADVANCED PO) Take 1 capsule by mouth daily. (Patient not taking: Reported on 01/02/2020)  . rosuvastatin (CRESTOR) 5 MG tablet Take 1 tablet by mouth daily.  . tamsulosin (FLOMAX) 0.4 MG CAPS capsule Take 0.8 mg by mouth daily.    No current facility-administered medications for this visit. (Other)      REVIEW OF SYSTEMS:    ALLERGIES No Known Allergies  PAST MEDICAL HISTORY Past Medical History:  Diagnosis Date  . Anxiety   . Arthritis    neck; limited neck movement  . Chronic rhinitis   . Coronary artery disease   . Depression    no treatment since he retired  . Dyspnea    with exertion  . GERD (gastroesophageal reflux disease)   . Headache    hx subdural hematoma  . Hyperlipidemia   . Hypertension   . Hypothyroidism   . OSA (obstructive sleep apnea)   . Stroke [facility]) ~21yrs ago   mild affects Lt eye  . Subdural hematoma (HCC) 2004  Past Surgical History:  Procedure Laterality Date  . APPENDECTOMY  1970s  . BRAIN HEMATOMA EVACUATION  ~2005   subdural hematoma  . CARPAL TUNNEL RELEASE Right 05/21/2016   Procedure: CARPAL TUNNEL RELEASE, right;  Surgeon: Ralph Salt, MD;  Location: [facility];  Service: Orthopedics;  Laterality: Right;  FAB  . CATARACT EXTRACTION Left   . CORONARY ARTERY BYPASS GRAFT  ~2006  . Heart bypass  2005  . ROTATOR CUFF REPAIR Right ~4 years    FAMILY HISTORY Family History  Problem Relation Age of Onset  . Heart disease Father   . Leukemia Son   . Stroke Mother     SOCIAL HISTORY Social History   Tobacco Use  . Smoking status: Never Smoker  . Smokeless tobacco: Never Used  Vaping Use  . Vaping Use: Never  used  Substance Use Topics  . Alcohol use: No  . Drug use: No         OPHTHALMIC EXAM: Base Eye Exam    Visual Acuity (Snellen - Linear)      Right Left   Dist [place] 20/20 20/20       Tonometry (Tonopen, 9:31 AM)      Right Left   Pressure 11 14       Pupils      Pupils Dark Light Shape React APD   Right PERRL 3 2 Round Brisk None   Left PERRL 3 2 Round Brisk None       Visual Fields (Counting fingers)      Left Right    Full Full       Neuro/Psych    Oriented x3: Yes   Mood/Affect: Normal       Dilation    Right eye: 1.0% Mydriacyl, 2.5% Phenylephrine @ 9:31 AM        Slit Lamp and Fundus Exam    External Exam      Right Left   External Normal Normal       Slit Lamp Exam      Right Left   Lids/Lashes Normal Normal   Conjunctiva/Sclera White and quiet White and quiet   Cornea Clear Clear   Anterior Chamber Deep and quiet Deep and quiet   Iris Round and reactive Round and reactive   Lens Posterior chamber intraocular lens Posterior chamber intraocular lens   Anterior Vitreous Normal Normal       Fundus Exam      Right Left   Posterior Vitreous Posterior vitreous detachment    Disc Normal    C/D Ratio 0.35    Macula Cystoid macular edema, Macular thickening, no exudates, Microaneurysms    Vessels [place] macular branch retinal vein occlusion.    Periphery Normal           IMAGING AND PROCEDURES  Imaging and Procedures for 01/23/20  OCT, Retina - OU - Both Eyes       Right Eye Quality was good. Scan locations included subfoveal. Central Foveal Thickness: 391. Progression has been stable. Findings include cystoid macular edema.   Left Eye Quality was good. Scan locations included subfoveal. Central Foveal Thickness: 292. Progression has improved. Findings include cystoid macular edema.   Notes OD, branch retinal vein occlusion with juxta foveal recurrence of leakage, typically improves at 2 to 3 weeks post injection and worsens around 5  weeks.  We will repeat intravitreal Avastin to maintain and prevent center involvement  OS, CME has improved post injection  Intravitreal Injection, Pharmacologic Agent - OD - Right Eye       Time Out 01/23/2020. 10:34 AM. Confirmed correct patient, procedure, site, and patient consented.   Anesthesia Topical anesthesia was used. Anesthetic medications included [place] 3.5%.   Procedure Preparation included Tobramycin 0.3%, 10% betadine to eyelids, 5% betadine to ocular surface. A supplied needle was used.   Injection:  1.25 mg Bevacizumab (AVASTIN) SOLN   NDC: [address]-8   Route: Intravitreal, Site: Right Eye, Waste: 0 mg  Post-op Post injection exam found visual acuity of at least counting fingers. The patient tolerated the procedure well. There were no complications. The patient received written and verbal post procedure care education. Post injection medications were not given.                 ASSESSMENT/PLAN:  Branch retinal vein occlusion with macular edema of right eye The nature of branch retinal vein occlusion with macular edema was discussed.  The patient was given access to printed information.  The treatment options including continued observation looking for spontaneous resolution versus grid laser versus intravitreal Kenalog injection were discussed.  PRIMARY THERAPY CONSISTS of Anti-VEGF Therapies, AVASTIN, LUCENTIS AND EYLEA.  Their usage was discussed to assist in halting the progression of Macular Edema, in order to preserve, protect or improve acuity.  Additionally, at times, limited focal laser therapy is used in the management.  The risks and benefits of all these options were discussed with the patient.  The patient's questions were answered.        ICD-10-CM   1. Branch retinal vein occlusion with macular edema of right eye  H34.8310 OCT, Retina - OU - Both Eyes    Intravitreal Injection, Pharmacologic Agent - OD - Right Eye    Bevacizumab  (AVASTIN) SOLN 1.25 mg    1.  Repeat injection intravitreal Avastin OD today to control CME from BRVO exam in 5 to 6 weeks OD  2.  Follow-up examination left eye as scheduled  3.  Ophthalmic Meds Ordered this visit:  Meds ordered this encounter  Medications  . Bevacizumab (AVASTIN) SOLN 1.25 mg       Return in about 6 weeks (around 03/05/2020) for dilate, OD, AVASTIN OCT.  There are no Patient Instructions on file for this visit.   Explained the diagnoses, plan, and follow up with the patient and they expressed understanding.  Patient expressed understanding of the importance of proper follow up care.   Alford Highland Kenlyn Lose M.D. Diseases & Surgery of the Retina and Vitreous [facility] 01/23/20     Abbreviations: M myopia (nearsighted); A astigmatism; H hyperopia (farsighted); P presbyopia; Mrx spectacle prescription;  CTL contact lenses; OD right eye; OS left eye; OU both eyes  XT exotropia; ET esotropia; PEK punctate epithelial keratitis; PEE punctate epithelial erosions; DES dry eye syndrome; MGD meibomian gland dysfunction; ATs artificial tears; PFAT's preservative free artificial tears; NSC nuclear sclerotic cataract; PSC posterior subcapsular cataract; ERM epi-retinal membrane; PVD posterior vitreous detachment; RD retinal detachment; DM diabetes mellitus; DR diabetic retinopathy; NPDR non-proliferative diabetic retinopathy; PDR proliferative diabetic retinopathy; CSME clinically significant macular edema; DME diabetic macular edema; dbh dot blot hemorrhages; CWS cotton wool spot; POAG primary open angle glaucoma; C/D cup-to-disc ratio; HVF humphrey visual field; GVF goldmann visual field; OCT optical coherence tomography; IOP intraocular pressure; BRVO Branch retinal vein occlusion; CRVO central retinal vein occlusion; CRAO central retinal artery occlusion; BRAO branch retinal artery occlusion; RT retinal tear; SB scleral buckle; PPV pars  plana vitrectomy; VH  Vitreous hemorrhage; PRP panretinal laser photocoagulation; IVK intravitreal kenalog; VMT vitreomacular traction; MH Macular hole;  NVD neovascularization of the disc; NVE neovascularization elsewhere; AREDS age related eye disease study; ARMD age related macular degeneration; POAG primary open angle glaucoma; EBMD epithelial/anterior basement membrane dystrophy; ACIOL anterior chamber intraocular lens; IOL intraocular lens; PCIOL posterior chamber intraocular lens; Phaco/IOL phacoemulsification with intraocular lens placement; PRK photorefractive keratectomy; LASIK laser assisted in situ keratomileusis; HTN hypertension; DM diabetes mellitus; COPD chronic obstructive pulmonary disease

## 2020-02-13 ENCOUNTER — Other Ambulatory Visit: Payer: Self-pay

## 2020-02-13 ENCOUNTER — Ambulatory Visit (INDEPENDENT_AMBULATORY_CARE_PROVIDER_SITE_OTHER): Payer: Medicare PPO | Admitting: Ophthalmology

## 2020-02-13 ENCOUNTER — Encounter (INDEPENDENT_AMBULATORY_CARE_PROVIDER_SITE_OTHER): Payer: Self-pay | Admitting: Ophthalmology

## 2020-02-13 DIAGNOSIS — G4733 Obstructive sleep apnea (adult) (pediatric): Secondary | ICD-10-CM | POA: Diagnosis not present

## 2020-02-13 DIAGNOSIS — H34832 Tributary (branch) retinal vein occlusion, left eye, with macular edema: Secondary | ICD-10-CM | POA: Diagnosis not present

## 2020-02-13 MED ORDER — BEVACIZUMAB CHEMO INJECTION 1.25MG/0.05ML SYRINGE FOR KALEIDOSCOPE
1.2500 mg | INTRAVITREAL | Status: AC | PRN
  Administered 2020-02-13: 1.25 mg via INTRAVITREAL

## 2020-02-13 NOTE — Assessment & Plan Note (Signed)
Patient reports compliance with CPAP use

## 2020-02-13 NOTE — Assessment & Plan Note (Signed)
Is typically postinjection, always worse in 5 to 6 weeks.  We will repeat injection intravitreal Avastin today

## 2020-02-13 NOTE — Progress Notes (Signed)
02/13/2020     CHIEF COMPLAINT Patient presents for Retina Follow Up   HISTORY OF PRESENT ILLNESS: [name] is a 79 y.o. male who presents to the clinic today for:   HPI    Retina Follow Up    Patient presents with  CRVO/BRVO.  In left eye.  Severity is moderate.  Duration of 6 weeks.  Since onset it is stable.  I, the attending physician,  performed the HPI with the patient and updated documentation appropriately.          Comments    6 Week BRVO f\u OS. Possible Avastin OS. OCT  Pt states no changes in vision. Denies any complaints.       Last edited by Elyse Jarvis on 02/13/2020  9:24 AM. (History)      Referring physician: [name], MD [address] [place],  [place] [address]  HISTORICAL INFORMATION:   Selected notes from the MEDICAL RECORD NUMBER       CURRENT MEDICATIONS: No current outpatient medications on file. (Ophthalmic Drugs)   No current facility-administered medications for this visit. (Ophthalmic Drugs)   Current Outpatient Medications (Other)  Medication Sig  . aspirin 81 MG tablet Take 81 mg by mouth daily.    . carvedilol (COREG) 12.5 MG tablet Take 1 tablet (12.5 mg total) by mouth 2 (two) times daily. (Patient taking differently: Take 6.25 mg by mouth 2 (two) times daily. )  . carvedilol (COREG) 6.25 MG tablet   . Cholecalciferol (VITAMIN D) 1000 UNITS capsule Take 1,000 Units by mouth daily.    . clotrimazole-betamethasone (LOTRISONE) cream  (Patient not taking: Reported on 01/02/2020)  . Coenzyme Q10 (COQ10) 100 MG CAPS Take 3 capsules by mouth daily.   . Cyanocobalamin (B-12) 2500 MCG TABS Take 1 tablet by mouth daily.  Marland Kitchen levothyroxine (SYNTHROID, LEVOTHROID) 75 MCG tablet Take 1 tablet by mouth daily.  . Misc Natural Products (PROSTATE HEALTH PO) Take 1 tablet by mouth daily. (Patient not taking: Reported on 01/02/2020)  . montelukast (SINGULAIR) 10 MG tablet Take 10 mg by mouth at bedtime. (Patient not taking: Reported on  01/02/2020)  . Multiple Vitamin (MULTIVITAMIN) tablet Take 1 tablet by mouth daily.  . nitroGLYCERIN (NITROSTAT) 0.4 MG SL tablet Place 1 tablet under the tongue every 5 minutes as needed.  . Omega-3 Fatty Acids (FISH OIL) 1200 MG CAPS Take 1,200 mg by mouth 2 (two) times daily.   . Probiotic Product (PROBIOTIC ADVANCED PO) Take 1 capsule by mouth daily. (Patient not taking: Reported on 01/02/2020)  . rosuvastatin (CRESTOR) 5 MG tablet Take 1 tablet by mouth daily.  . tamsulosin (FLOMAX) 0.4 MG CAPS capsule Take 0.8 mg by mouth daily.    No current facility-administered medications for this visit. (Other)      REVIEW OF SYSTEMS:    ALLERGIES No Known Allergies  PAST MEDICAL HISTORY Past Medical History:  Diagnosis Date  . Anxiety   . Arthritis    neck; limited neck movement  . Chronic rhinitis   . Coronary artery disease   . Depression    no treatment since he retired  . Dyspnea    with exertion  . GERD (gastroesophageal reflux disease)   . Headache    hx subdural hematoma  . Hyperlipidemia   . Hypertension   . Hypothyroidism   . OSA (obstructive sleep apnea)   . Stroke [facility]) ~10yrs ago   mild affects Lt eye  . Subdural hematoma (HCC) 2004  Past Surgical History:  Procedure Laterality Date  . APPENDECTOMY  1970s  . BRAIN HEMATOMA EVACUATION  ~2005   subdural hematoma  . CARPAL TUNNEL RELEASE Right 05/21/2016   Procedure: CARPAL TUNNEL RELEASE, right;  Surgeon: Cindee SaltGary Kuzma, MD;  Location: [facility];  Service: Orthopedics;  Laterality: Right;  FAB  . CATARACT EXTRACTION Left   . CORONARY ARTERY BYPASS GRAFT  ~2006  . Heart bypass  2005  . ROTATOR CUFF REPAIR Right ~4 years    FAMILY HISTORY Family History  Problem Relation Age of Onset  . Heart disease Father   . Leukemia Son   . Stroke Mother     SOCIAL HISTORY Social History   Tobacco Use  . Smoking status: Never Smoker  . Smokeless tobacco: Never Used  Vaping Use  . Vaping Use: Never  used  Substance Use Topics  . Alcohol use: No  . Drug use: No         OPHTHALMIC EXAM: Base Eye Exam    Visual Acuity (Snellen - Linear)      Right Left   Dist [place] 20/20 20/20 -1       Tonometry (Tonopen, 9:28 AM)      Right Left   Pressure 12 11       Pupils      Pupils Dark Light Shape React APD   Right PERRL 3 2 Round Brisk None   Left PERRL 3 2 Round Brisk None       Visual Fields (Counting fingers)      Left Right    Full Full       Neuro/Psych    Oriented x3: Yes   Mood/Affect: Normal       Dilation    Left eye: 1.0% Mydriacyl, 2.5% Phenylephrine @ 9:28 AM        Slit Lamp and Fundus Exam    External Exam      Right Left   External Normal Normal       Slit Lamp Exam      Right Left   Lids/Lashes Normal Normal   Conjunctiva/Sclera White and quiet White and quiet   Cornea Clear Clear   Anterior Chamber Deep and quiet Deep and quiet   Iris Round and reactive Round and reactive   Lens Posterior chamber intraocular lens Posterior chamber intraocular lens   Anterior Vitreous Normal Normal       Fundus Exam      Right Left   Posterior Vitreous  Posterior vitreous detachment   Disc  Pallor 1+   C/D Ratio  0.6   Macula  Microaneurysms, Macular thickening, Hard drusen, no exudates, no atrophy   Vessels  Old macular branch retinal vein occlusion   Periphery  Normal          IMAGING AND PROCEDURES  Imaging and Procedures for 02/13/20  OCT, Retina - OU - Both Eyes       Right Eye Quality was good. Central Foveal Thickness: 373. Progression has improved.   Left Eye Quality was good. Scan locations included subfoveal. Central Foveal Thickness: 341. Progression has been stable.   Notes Slightly recurrent CME from macular BRVO.  There are some component of macular telangiectasis, as patient does have sleep apnea.he does report compliance with his CPAP use  Injection Avastin the CME does improve for 3 to 5 weeks and typically, the goal will  be to keep the CME from intrusion to the central fovea  Intravitreal Injection, Pharmacologic Agent - OS - Left Eye       Time Out 02/13/2020. 10:14 AM. Confirmed correct patient, procedure, site, and patient consented.   Anesthesia Topical anesthesia was used.   Procedure Preparation included Ofloxacin , 10% betadine to eyelids, 5% betadine to ocular surface. A 30 gauge needle was used.   Injection:  1.25 mg Bevacizumab (AVASTIN) SOLN   NDC: [address]-2, Lot: [id number]   Route: Intravitreal, Site: Left Eye, Waste: 0 mg  Post-op Post injection exam found visual acuity of at least counting fingers. The patient tolerated the procedure well. There were no complications. The patient received written and verbal post procedure care education. Post injection medications were not given.                 ASSESSMENT/PLAN:  OBSTRUCTIVE SLEEP APNEA Patient reports compliance with CPAP use  Branch retinal vein occlusion with macular edema of left eye Is typically postinjection, always worse in 5 to 6 weeks.  We will repeat injection intravitreal Avastin today      ICD-10-CM   1. Branch retinal vein occlusion with macular edema of left eye  H34.8320 OCT, Retina - OU - Both Eyes    Intravitreal Injection, Pharmacologic Agent - OS - Left Eye    Bevacizumab (AVASTIN) SOLN 1.25 mg  2. OBSTRUCTIVE SLEEP APNEA  G47.33     1.  OS, BRVO, overall improved, center free of CME yet we will repeat injection of Avastin today to prevent juxta foveal CME intrusion.  Repeat in 6 weeks  2.  OD follow-up examination as scheduled  3.  Ophthalmic Meds Ordered this visit:  Meds ordered this encounter  Medications  . Bevacizumab (AVASTIN) SOLN 1.25 mg       Return in about 6 weeks (around 03/26/2020) for dilate, OS, AVASTIN OCT.  There are no Patient Instructions on file for this visit.   Explained the diagnoses, plan, and follow up with the patient and they expressed understanding.   Patient expressed understanding of the importance of proper follow up care.   Alford Highland Zamoria Boss M.D. Diseases & Surgery of the Retina and Vitreous [facility] 02/13/20     Abbreviations: M myopia (nearsighted); A astigmatism; H hyperopia (farsighted); P presbyopia; Mrx spectacle prescription;  CTL contact lenses; OD right eye; OS left eye; OU both eyes  XT exotropia; ET esotropia; PEK punctate epithelial keratitis; PEE punctate epithelial erosions; DES dry eye syndrome; MGD meibomian gland dysfunction; ATs artificial tears; PFAT's preservative free artificial tears; NSC nuclear sclerotic cataract; PSC posterior subcapsular cataract; ERM epi-retinal membrane; PVD posterior vitreous detachment; RD retinal detachment; DM diabetes mellitus; DR diabetic retinopathy; NPDR non-proliferative diabetic retinopathy; PDR proliferative diabetic retinopathy; CSME clinically significant macular edema; DME diabetic macular edema; dbh dot blot hemorrhages; CWS cotton wool spot; POAG primary open angle glaucoma; C/D cup-to-disc ratio; HVF humphrey visual field; GVF goldmann visual field; OCT optical coherence tomography; IOP intraocular pressure; BRVO Branch retinal vein occlusion; CRVO central retinal vein occlusion; CRAO central retinal artery occlusion; BRAO branch retinal artery occlusion; RT retinal tear; SB scleral buckle; PPV pars plana vitrectomy; VH Vitreous hemorrhage; PRP panretinal laser photocoagulation; IVK intravitreal kenalog; VMT vitreomacular traction; MH Macular hole;  NVD neovascularization of the disc; NVE neovascularization elsewhere; AREDS age related eye disease study; ARMD age related macular degeneration; POAG primary open angle glaucoma; EBMD epithelial/anterior basement membrane dystrophy; ACIOL anterior chamber intraocular lens; IOL intraocular lens; PCIOL posterior chamber intraocular lens; Phaco/IOL phacoemulsification with intraocular lens placement;  PRK photorefractive  keratectomy; LASIK laser assisted in situ keratomileusis; HTN hypertension; DM diabetes mellitus; COPD chronic obstructive pulmonary disease

## 2020-02-14 DIAGNOSIS — Z1339 Encounter for screening examination for other mental health and behavioral disorders: Secondary | ICD-10-CM | POA: Diagnosis not present

## 2020-02-14 DIAGNOSIS — Z6831 Body mass index (BMI) 31.0-31.9, adult: Secondary | ICD-10-CM | POA: Diagnosis not present

## 2020-02-14 DIAGNOSIS — Z0000 Encounter for general adult medical examination without abnormal findings: Secondary | ICD-10-CM | POA: Diagnosis not present

## 2020-02-18 DIAGNOSIS — G4733 Obstructive sleep apnea (adult) (pediatric): Secondary | ICD-10-CM | POA: Diagnosis not present

## 2020-02-22 ENCOUNTER — Ambulatory Visit: Payer: Medicare PPO | Admitting: Cardiology

## 2020-02-22 ENCOUNTER — Other Ambulatory Visit: Payer: Self-pay

## 2020-02-22 ENCOUNTER — Encounter: Payer: Self-pay | Admitting: Cardiology

## 2020-02-22 VITALS — BP 118/60 | HR 74 | Ht 68.0 in | Wt 192.0 lb

## 2020-02-22 DIAGNOSIS — I2510 Atherosclerotic heart disease of native coronary artery without angina pectoris: Secondary | ICD-10-CM

## 2020-02-22 DIAGNOSIS — I10 Essential (primary) hypertension: Secondary | ICD-10-CM

## 2020-02-22 DIAGNOSIS — E785 Hyperlipidemia, unspecified: Secondary | ICD-10-CM

## 2020-02-22 DIAGNOSIS — I255 Ischemic cardiomyopathy: Secondary | ICD-10-CM | POA: Diagnosis not present

## 2020-02-22 NOTE — Patient Instructions (Signed)
Medication Instructions:  °Your physician recommends that you continue on your current medications as directed. Please refer to the Current Medication list given to you today. ° °*If you need a refill on your cardiac medications before your next appointment, please call your pharmacy* ° ° °Lab Work: °None. ° °If you have labs (blood work) drawn today and your tests are completely normal, you will receive your results only by: °• MyChart Message (if you have MyChart) OR °• A paper copy in the mail °If you have any lab test that is abnormal or we need to change your treatment, we will call you to review the results. ° ° °Testing/Procedures: °Your physician has requested that you have an echocardiogram. Echocardiography is a painless test that uses sound waves to create images of your heart. It provides your doctor with information about the size and shape of your heart and how well your heart’s chambers and valves are working. This procedure takes approximately one hour. There are no restrictions for this procedure. ° ° ° ° °Follow-Up: °At [facility], you and your health needs are our priority.  As part of our continuing mission to provide you with exceptional heart care, we have created designated Provider Care Teams.  These Care Teams include your primary Cardiologist (physician) and Advanced Practice Providers (APPs -  Physician Assistants and Nurse Practitioners) who all work together to provide you with the care you need, when you need it. ° °We recommend signing up for the patient portal called "MyChart".  Sign up information is provided on this After Visit Summary.  MyChart is used to connect with patients for Virtual Visits (Telemedicine).  Patients are able to view lab/test results, encounter notes, upcoming appointments, etc.  Non-urgent messages can be sent to your provider as well.   °To learn more about what you can do with MyChart, go to [email or web address].   ° °Your next appointment:   °6  month(s) ° °The format for your next appointment:   °In Person ° °Provider:   °Robert Krasowski, MD ° ° °Other Instructions ° ° °Echocardiogram °An echocardiogram is a procedure that uses painless sound waves (ultrasound) to produce an image of the heart. Images from an echocardiogram can provide important information about: °· Signs of coronary artery disease (CAD). °· Aneurysm detection. An aneurysm is a weak or damaged part of an artery wall that bulges out from the normal force of blood pumping through the body. °· Heart size and shape. Changes in the size or shape of the heart can be associated with certain conditions, including heart failure, aneurysm, and CAD. °· Heart muscle function. °· Heart valve function. °· Signs of a past heart attack. °· Fluid buildup around the heart. °· Thickening of the heart muscle. °· A tumor or infectious growth around the heart valves. °Tell a health care provider about: °· Any allergies you have. °· All medicines you are taking, including vitamins, herbs, eye drops, creams, and over-the-counter medicines. °· Any blood disorders you have. °· Any surgeries you have had. °· Any medical conditions you have. °· Whether you are pregnant or may be pregnant. °What are the risks? °Generally, this is a safe procedure. However, problems may occur, including: °· Allergic reaction to dye (contrast) that may be used during the procedure. °What happens before the procedure? °No specific preparation is needed. You may eat and drink normally. °What happens during the procedure? ° °· An IV tube may be inserted into one of your veins. °· You may   receive contrast through this tube. A contrast is an injection that improves the quality of the pictures from your heart. °· A gel will be applied to your chest. °· A wand-like tool (transducer) will be moved over your chest. The gel will help to transmit the sound waves from the transducer. °· The sound waves will harmlessly bounce off of your heart to  allow the heart images to be captured in real-time motion. The images will be recorded on a computer. °The procedure may vary among health care providers and hospitals. °What happens after the procedure? °· You may return to your normal, everyday life, including diet, activities, and medicines, unless your health care provider tells you not to do that. °Summary °· An echocardiogram is a procedure that uses painless sound waves (ultrasound) to produce an image of the heart. °· Images from an echocardiogram can provide important information about the size and shape of your heart, heart muscle function, heart valve function, and fluid buildup around your heart. °· You do not need to do anything to prepare before this procedure. You may eat and drink normally. °· After the echocardiogram is completed, you may return to your normal, everyday life, unless your health care provider tells you not to do that. °This information is not intended to replace advice given to you by your health care provider. Make sure you discuss any questions you have with your health care provider. °Document Revised: 09/08/2018 Document Reviewed: 06/20/2016 °Elsevier Patient Education © 2020 Elsevier Inc. ° ° °

## 2020-02-22 NOTE — Addendum Note (Signed)
Addended by: Hazle Quant on: 02/22/2020 01:11 PM   Modules accepted: Orders

## 2020-02-22 NOTE — Progress Notes (Signed)
Cardiology Office Note:    Date:  02/22/2020   ID:  [name], DOB 1940-11-13, MRN [id number]  PCP:  [name], MD  Cardiologist:  [name], MD    Referring MD: [name], MD   No chief complaint on file. L doing well  History of Present Illness:    [name] is a 79 y.o. male with past medical history significant for coronary artery disease, status post coronary artery bypass graft done years ago, stress test done more than a year ago showed small area of ischemia.  Patient favor medical management.  And he seems to be doing well.  He admits to doing this, he lives very sedentary lifestyle because he does not want to go outside to heat.  However now he started doing more he even started cutting grass and have no difficulty doing it.  He is concerned about his wife who is gradually deteriorating.  He is being also scheduled to see kidney specialist.  Denies having any chest pain tightness squeezing pressure burning chest no dizziness no passing out.  Past Medical History:  Diagnosis Date  . Anxiety   . Arthritis    neck; limited neck movement  . Chronic rhinitis   . Coronary artery disease   . Depression    no treatment since he retired  . Dyspnea    with exertion  . GERD (gastroesophageal reflux disease)   . Headache    hx subdural hematoma  . Hyperlipidemia   . Hypertension   . Hypothyroidism   . OSA (obstructive sleep apnea)   . Stroke [facility]) ~25yrs ago   mild affects Lt eye  . Subdural hematoma (HCC) 2004    Past Surgical History:  Procedure Laterality Date  . APPENDECTOMY  1970s  . BRAIN HEMATOMA EVACUATION  ~2005   subdural hematoma  . CARPAL TUNNEL RELEASE Right 05/21/2016   Procedure: CARPAL TUNNEL RELEASE, right;  Surgeon: Cindee Salt, MD;  Location: [facility];  Service: Orthopedics;  Laterality: Right;  FAB  . CATARACT EXTRACTION Left   . CORONARY ARTERY BYPASS GRAFT  ~2006  . Heart bypass  2005  . ROTATOR  CUFF REPAIR Right ~4 years    Current Medications: Current Meds  Medication Sig  . aspirin 81 MG tablet Take 81 mg by mouth daily.    . carvedilol (COREG) 12.5 MG tablet Take 1 tablet (12.5 mg total) by mouth 2 (two) times daily. (Patient taking differently: Take 6.25 mg by mouth 2 (two) times daily. )  . Cholecalciferol (VITAMIN D) 1000 UNITS capsule Take 1,000 Units by mouth daily.    . Coenzyme Q10 (COQ10) 100 MG CAPS Take 3 capsules by mouth daily.   . Cyanocobalamin (B-12) 1000 MCG CAPS Take 1 tablet by mouth daily.   Marland Kitchen levothyroxine (SYNTHROID, LEVOTHROID) 75 MCG tablet Take 1 tablet by mouth daily.  . Misc Natural Products (PROSTATE HEALTH PO) Take 1 tablet by mouth daily.   . Multiple Vitamin (MULTIVITAMIN) tablet Take 1 tablet by mouth daily.  . nitroGLYCERIN (NITROSTAT) 0.4 MG SL tablet Place 1 tablet under the tongue every 5 minutes as needed.  . Omega-3 Fatty Acids (FISH OIL) 1200 MG CAPS Take 1,200 mg by mouth 2 (two) times daily.   . rosuvastatin (CRESTOR) 5 MG tablet Take 1 tablet by mouth daily.  . tamsulosin (FLOMAX) 0.4 MG CAPS capsule Take 0.4 mg by mouth daily.      Allergies:   Patient has no known allergies.  Social History   Socioeconomic History  . Marital status: Married    Spouse name: [name]  . Number of children: 1  . Years of education: Not on file  . Highest education level: Not on file  Occupational History    Comment: [name]    Comment: [employer]  Tobacco Use  . Smoking status: Never Smoker  . Smokeless tobacco: Never Used  Vaping Use  . Vaping Use: Never used  Substance and Sexual Activity  . Alcohol use: No  . Drug use: No  . Sexual activity: Not on file  Other Topics Concern  . 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 Difficulty of Paying Living Expenses: Not on file  Food Insecurity:    . Worried About Programme researcher, broadcasting/film/video in the Last Year: Not on file  . Ran Out of Food in the Last Year: Not on file  Transportation Needs:   . Lack of Transportation (Medical): Not on file  . Lack of Transportation (Non-Medical): Not on file  Physical Activity:   . Days of Exercise per Week: Not on file  . Minutes of Exercise per Session: Not on file  Stress:   . Feeling of Stress : Not on file  Social Connections:   . Frequency of Communication with Friends and Family: Not on file  . Frequency of Social Gatherings with Friends and Family: Not on file  . Attends Religious Services: Not on file  . Active Member of Clubs or Organizations: Not on file  . Attends Banker Meetings: Not on file  . Marital Status: Not on file     Family History: The patient's family history includes Heart disease in his father; Leukemia in his son; Stroke in his mother. ROS:   Please see the history of present illness.    All 14 point review of systems negative except as described per history of present illness  EKGs/Labs/Other Studies Reviewed:      Recent Labs: No results found for requested labs within last 8760 hours.  Recent Lipid Panel No results found for: CHOL, TRIG, HDL, CHOLHDL, VLDL, LDLCALC, LDLDIRECT  Physical Exam:    VS:  BP 118/60   Pulse 74   Ht 5\' 8"  (1.727 m)   Wt 192 lb (87.1 kg)   SpO2 98%   BMI 29.19 kg/m     Wt Readings from Last 3 Encounters:  02/22/20 192 lb (87.1 kg)  08/28/19 197 lb 6 oz (89.5 kg)  04/21/19 187 lb 3.2 oz (84.9 kg)     GEN:  Well nourished, well developed in no acute distress HEENT: Normal NECK: No JVD; No carotid bruits LYMPHATICS: No lymphadenopathy CARDIAC: RRR, no murmurs, no rubs, no gallops RESPIRATORY:  Clear to auscultation without rales, wheezing or rhonchi  ABDOMEN: Soft, non-tender, non-distended MUSCULOSKELETAL:  No edema; No deformity  SKIN: Warm and dry LOWER EXTREMITIES: no swelling NEUROLOGIC:  Alert and  oriented x 3 PSYCHIATRIC:  Normal affect   ASSESSMENT:    1. Coronary artery disease involving native coronary artery of native heart without angina pectoris   2. Essential hypertension   3. Ischemic cardiomyopathy   4. Dyslipidemia (high LDL; low HDL)    PLAN:    In order of problems listed above:  1. Coronary disease with mildly abnormal stress test manage  current management doing well from that point review asymptomatic we will continue present management. 2. Essential hypertension blood pressure well controlled continue present management. 3. Dyslipidemia: I did review laboratory test from primary care physician showing LDL of 59.  We will continue present management he is only on moderate intensity statin in form of Crestor 5 mg daily. 4. Ischemic cardiomyopathy with ejection fraction 45% last year.  We will repeat echocardiogram.  He is only on carvedilol 6.25.  Is not on ACE inhibitor/ARB secondary to kidney dysfunction.  We will recheck echocardiogram if there is deterioration of left ventricle ejection fraction we will reconsider ARB/ACE versus Entresto.   Medication Adjustments/Labs and Tests Ordered: Current medicines are reviewed at length with the patient today.  Concerns regarding medicines are outlined above.  No orders of the defined types were placed in this encounter.  Medication changes: No orders of the defined types were placed in this encounter.   Signed, Georgeanna Lea, MD, [facility] 02/22/2020 1:06 PM    [facility]

## 2020-03-05 ENCOUNTER — Ambulatory Visit (INDEPENDENT_AMBULATORY_CARE_PROVIDER_SITE_OTHER): Payer: Medicare PPO | Admitting: Ophthalmology

## 2020-03-05 ENCOUNTER — Other Ambulatory Visit: Payer: Self-pay

## 2020-03-05 ENCOUNTER — Encounter (INDEPENDENT_AMBULATORY_CARE_PROVIDER_SITE_OTHER): Payer: Self-pay | Admitting: Ophthalmology

## 2020-03-05 DIAGNOSIS — H34831 Tributary (branch) retinal vein occlusion, right eye, with macular edema: Secondary | ICD-10-CM

## 2020-03-05 MED ORDER — BEVACIZUMAB CHEMO INJECTION 1.25MG/0.05ML SYRINGE FOR KALEIDOSCOPE
1.2500 mg | INTRAVITREAL | Status: AC | PRN
  Administered 2020-03-05: 1.25 mg via INTRAVITREAL

## 2020-03-05 NOTE — Progress Notes (Signed)
03/05/2020     CHIEF COMPLAINT Patient presents for Retina Follow Up   HISTORY OF PRESENT ILLNESS: [name] is a 79 y.o. male who presents to the clinic today for:   HPI    Retina Follow Up    Patient presents with  CRVO/BRVO.  In right eye.  Severity is moderate.  Duration of 6 weeks.  Since onset it is stable.  I, the attending physician,  performed the HPI with the patient and updated documentation appropriately.          Comments    6 Week BRVO f\u OD. Possible Avastin OD. OCT  Pt states no changes in vision. Denies any new complaints.       Last edited by Elyse Jarvis on 03/05/2020  9:29 AM. (History)      Referring physician: [name], MD [address] [place],  [place] [address]  HISTORICAL INFORMATION:   Selected notes from the MEDICAL RECORD NUMBER       CURRENT MEDICATIONS: No current outpatient medications on file. (Ophthalmic Drugs)   No current facility-administered medications for this visit. (Ophthalmic Drugs)   Current Outpatient Medications (Other)  Medication Sig  . aspirin 81 MG tablet Take 81 mg by mouth daily.    . carvedilol (COREG) 12.5 MG tablet Take 1 tablet (12.5 mg total) by mouth 2 (two) times daily. (Patient taking differently: Take 6.25 mg by mouth 2 (two) times daily. )  . Cholecalciferol (VITAMIN D) 1000 UNITS capsule Take 1,000 Units by mouth daily.    . Coenzyme Q10 (COQ10) 100 MG CAPS Take 3 capsules by mouth daily.   . Cyanocobalamin (B-12) 1000 MCG CAPS Take 1 tablet by mouth daily.   Marland Kitchen levothyroxine (SYNTHROID, LEVOTHROID) 75 MCG tablet Take 1 tablet by mouth daily.  . Misc Natural Products (PROSTATE HEALTH PO) Take 1 tablet by mouth daily.   . Multiple Vitamin (MULTIVITAMIN) tablet Take 1 tablet by mouth daily.  . nitroGLYCERIN (NITROSTAT) 0.4 MG SL tablet Place 1 tablet under the tongue every 5 minutes as needed.  . Omega-3 Fatty Acids (FISH OIL) 1200 MG CAPS Take 1,200 mg by mouth 2 (two) times daily.   .  rosuvastatin (CRESTOR) 5 MG tablet Take 1 tablet by mouth daily.  . tamsulosin (FLOMAX) 0.4 MG CAPS capsule Take 0.4 mg by mouth daily.    No current facility-administered medications for this visit. (Other)      REVIEW OF SYSTEMS:    ALLERGIES No Known Allergies  PAST MEDICAL HISTORY Past Medical History:  Diagnosis Date  . Anxiety   . Arthritis    neck; limited neck movement  . Chronic rhinitis   . Coronary artery disease   . Depression    no treatment since he retired  . Dyspnea    with exertion  . GERD (gastroesophageal reflux disease)   . Headache    hx subdural hematoma  . Hyperlipidemia   . Hypertension   . Hypothyroidism   . OSA (obstructive sleep apnea)   . Stroke [facility]) ~91yrs ago   mild affects Lt eye  . Subdural hematoma (HCC) 2004   Past Surgical History:  Procedure Laterality Date  . APPENDECTOMY  1970s  . BRAIN HEMATOMA EVACUATION  ~2005   subdural hematoma  . CARPAL TUNNEL RELEASE Right 05/21/2016   Procedure: CARPAL TUNNEL RELEASE, right;  Surgeon: Cindee Salt, MD;  Location: [facility];  Service: Orthopedics;  Laterality: Right;  FAB  . CATARACT EXTRACTION Left   .  CORONARY ARTERY BYPASS GRAFT  ~2006  . Heart bypass  2005  . ROTATOR CUFF REPAIR Right ~4 years    FAMILY HISTORY Family History  Problem Relation Age of Onset  . Heart disease Father   . Leukemia Son   . Stroke Mother     SOCIAL HISTORY Social History   Tobacco Use  . Smoking status: Never Smoker  . Smokeless tobacco: Never Used  Vaping Use  . Vaping Use: Never used  Substance Use Topics  . Alcohol use: No  . Drug use: No         OPHTHALMIC EXAM:  Base Eye Exam    Visual Acuity (Snellen - Linear)      Right Left   Dist [place] 20/20 20/20       Tonometry (Tonopen, 9:33 AM)      Right Left   Pressure 13 12       Pupils      Pupils Dark Light Shape React APD   Right PERRL 3 2 Round Slow None   Left PERRL 3 2 Round Slow None       Visual  Fields (Counting fingers)      Left Right    Full Full       Neuro/Psych    Oriented x3: Yes   Mood/Affect: Normal       Dilation    Right eye: 1.0% Mydriacyl, 2.5% Phenylephrine @ 9:33 AM        Slit Lamp and Fundus Exam    External Exam      Right Left   External Normal Normal       Slit Lamp Exam      Right Left   Lids/Lashes Normal Normal   Conjunctiva/Sclera White and quiet White and quiet   Cornea Clear Clear   Anterior Chamber Deep and quiet Deep and quiet   Iris Round and reactive Round and reactive   Lens Posterior chamber intraocular lens Posterior chamber intraocular lens   Anterior Vitreous Normal Normal       Fundus Exam      Right Left   Posterior Vitreous Posterior vitreous detachment    Disc Normal    C/D Ratio 0.35    Macula Cystoid macular edema, Macular thickening, no exudates, Microaneurysms    Vessels Twigg macular branch retinal vein occlusion.    Periphery Normal           IMAGING AND PROCEDURES  Imaging and Procedures for 03/05/20  OCT, Retina - OU - Both Eyes       Right Eye Quality was good. Scan locations included subfoveal. Central Foveal Thickness: 406. Progression has worsened. Findings include cystoid macular edema.   Left Eye Quality was good. Scan locations included subfoveal. Central Foveal Thickness: 283. Findings include abnormal foveal contour.   Notes CME OD has worsened at 7 to 8-week interval post Avastin, will repeat injection today And reevaluate in 6 weeks.  OS much improved.  CME temporally, and atrophy superiorly.  Follow-up as scheduled       Intravitreal Injection, Pharmacologic Agent - OD - Right Eye       Time Out 03/05/2020. 10:58 AM. Confirmed correct patient, procedure, site, and patient consented.   Anesthesia Topical anesthesia was used. Anesthetic medications included Akten 3.5%.   Procedure Preparation included Tobramycin 0.3%, 10% betadine to eyelids, 5% betadine to ocular surface. A 30  gauge needle was used.   Injection:  1.25 mg Bevacizumab (AVASTIN) SOLN   NDC: 70360-001-02,  Lot: [id number]   Route: Intravitreal, Site: Right Eye, Waste: 0 mg  Post-op Post injection exam found visual acuity of at least counting fingers. The patient tolerated the procedure well. There were no complications. The patient received written and verbal post procedure care education. Post injection medications were not given.                 ASSESSMENT/PLAN:  No problem-specific Assessment & Plan notes found for this encounter.      ICD-10-CM   1. Branch retinal vein occlusion with macular edema of right eye  H34.8310 OCT, Retina - OU - Both Eyes    Intravitreal Injection, Pharmacologic Agent - OD - Right Eye    Bevacizumab (AVASTIN) SOLN 1.25 mg    1.  Repeat intravitreal Avastin OD today and follow-up examination in 6 weeks  2.  Follow-up OS as scheduled  3.  Ophthalmic Meds Ordered this visit:  Meds ordered this encounter  Medications  . Bevacizumab (AVASTIN) SOLN 1.25 mg       Return in about 6 weeks (around 04/16/2020) for dilate, OD, AVASTIN OCT.  Patient Instructions  Patient asked to report any new onset visual acuity distortions reclined in either eye    Explained the diagnoses, plan, and follow up with the patient and they expressed understanding.  Patient expressed understanding of the importance of proper follow up care.   Alford Highland Morgana Rowley M.D. Diseases & Surgery of the Retina and Vitreous [facility] 03/05/20     Abbreviations: M myopia (nearsighted); A astigmatism; H hyperopia (farsighted); P presbyopia; Mrx spectacle prescription;  CTL contact lenses; OD right eye; OS left eye; OU both eyes  XT exotropia; ET esotropia; PEK punctate epithelial keratitis; PEE punctate epithelial erosions; DES dry eye syndrome; MGD meibomian gland dysfunction; ATs artificial tears; PFAT's preservative free artificial tears; NSC nuclear sclerotic  cataract; PSC posterior subcapsular cataract; ERM epi-retinal membrane; PVD posterior vitreous detachment; RD retinal detachment; DM diabetes mellitus; DR diabetic retinopathy; NPDR non-proliferative diabetic retinopathy; PDR proliferative diabetic retinopathy; CSME clinically significant macular edema; DME diabetic macular edema; dbh dot blot hemorrhages; CWS cotton wool spot; POAG primary open angle glaucoma; C/D cup-to-disc ratio; HVF humphrey visual field; GVF goldmann visual field; OCT optical coherence tomography; IOP intraocular pressure; BRVO Branch retinal vein occlusion; CRVO central retinal vein occlusion; CRAO central retinal artery occlusion; BRAO branch retinal artery occlusion; RT retinal tear; SB scleral buckle; PPV pars plana vitrectomy; VH Vitreous hemorrhage; PRP panretinal laser photocoagulation; IVK intravitreal kenalog; VMT vitreomacular traction; MH Macular hole;  NVD neovascularization of the disc; NVE neovascularization elsewhere; AREDS age related eye disease study; ARMD age related macular degeneration; POAG primary open angle glaucoma; EBMD epithelial/anterior basement membrane dystrophy; ACIOL anterior chamber intraocular lens; IOL intraocular lens; PCIOL posterior chamber intraocular lens; Phaco/IOL phacoemulsification with intraocular lens placement; PRK photorefractive keratectomy; LASIK laser assisted in situ keratomileusis; HTN hypertension; DM diabetes mellitus; COPD chronic obstructive pulmonary disease

## 2020-03-05 NOTE — Patient Instructions (Signed)
Patient asked to report any new onset visual acuity distortions reclined in either eye

## 2020-03-14 ENCOUNTER — Other Ambulatory Visit: Payer: Medicare PPO

## 2020-03-14 DIAGNOSIS — H401123 Primary open-angle glaucoma, left eye, severe stage: Secondary | ICD-10-CM | POA: Diagnosis not present

## 2020-03-14 DIAGNOSIS — H35373 Puckering of macula, bilateral: Secondary | ICD-10-CM | POA: Diagnosis not present

## 2020-03-14 DIAGNOSIS — H40011 Open angle with borderline findings, low risk, right eye: Secondary | ICD-10-CM | POA: Diagnosis not present

## 2020-03-14 DIAGNOSIS — H34833 Tributary (branch) retinal vein occlusion, bilateral, with macular edema: Secondary | ICD-10-CM | POA: Diagnosis not present

## 2020-03-18 ENCOUNTER — Other Ambulatory Visit: Payer: Self-pay

## 2020-03-18 ENCOUNTER — Ambulatory Visit (INDEPENDENT_AMBULATORY_CARE_PROVIDER_SITE_OTHER): Payer: Medicare PPO

## 2020-03-18 DIAGNOSIS — I255 Ischemic cardiomyopathy: Secondary | ICD-10-CM | POA: Diagnosis not present

## 2020-03-18 DIAGNOSIS — I2510 Atherosclerotic heart disease of native coronary artery without angina pectoris: Secondary | ICD-10-CM | POA: Diagnosis not present

## 2020-03-18 LAB — ECHOCARDIOGRAM COMPLETE
Area-P 1/2: 3.12 cm2
Calc EF: 47.2 %
P 1/2 time: 491 msec
S' Lateral: 2.7 cm
Single Plane A2C EF: 46.7 %
Single Plane A4C EF: 49 %

## 2020-03-18 NOTE — Progress Notes (Signed)
Complete echocardiogram performed.  Jimmy Abdoulie Tierce RDCS, RVT  

## 2020-03-19 DIAGNOSIS — G4733 Obstructive sleep apnea (adult) (pediatric): Secondary | ICD-10-CM | POA: Diagnosis not present

## 2020-03-26 ENCOUNTER — Encounter (INDEPENDENT_AMBULATORY_CARE_PROVIDER_SITE_OTHER): Payer: Medicare PPO | Admitting: Ophthalmology

## 2020-03-27 DIAGNOSIS — K648 Other hemorrhoids: Secondary | ICD-10-CM | POA: Diagnosis not present

## 2020-03-27 DIAGNOSIS — K219 Gastro-esophageal reflux disease without esophagitis: Secondary | ICD-10-CM | POA: Diagnosis not present

## 2020-03-27 DIAGNOSIS — R634 Abnormal weight loss: Secondary | ICD-10-CM | POA: Diagnosis not present

## 2020-03-28 DIAGNOSIS — R1011 Right upper quadrant pain: Secondary | ICD-10-CM | POA: Diagnosis not present

## 2020-03-28 DIAGNOSIS — J00 Acute nasopharyngitis [common cold]: Secondary | ICD-10-CM | POA: Diagnosis not present

## 2020-03-28 DIAGNOSIS — U071 COVID-19: Secondary | ICD-10-CM | POA: Diagnosis not present

## 2020-03-28 DIAGNOSIS — K760 Fatty (change of) liver, not elsewhere classified: Secondary | ICD-10-CM | POA: Diagnosis not present

## 2020-03-29 DIAGNOSIS — U071 COVID-19: Secondary | ICD-10-CM | POA: Diagnosis not present

## 2020-04-16 ENCOUNTER — Other Ambulatory Visit: Payer: Self-pay

## 2020-04-16 ENCOUNTER — Ambulatory Visit (INDEPENDENT_AMBULATORY_CARE_PROVIDER_SITE_OTHER): Payer: Medicare PPO | Admitting: Ophthalmology

## 2020-04-16 ENCOUNTER — Encounter (INDEPENDENT_AMBULATORY_CARE_PROVIDER_SITE_OTHER): Payer: Self-pay | Admitting: Ophthalmology

## 2020-04-16 DIAGNOSIS — H34831 Tributary (branch) retinal vein occlusion, right eye, with macular edema: Secondary | ICD-10-CM | POA: Diagnosis not present

## 2020-04-16 DIAGNOSIS — G4733 Obstructive sleep apnea (adult) (pediatric): Secondary | ICD-10-CM | POA: Diagnosis not present

## 2020-04-16 DIAGNOSIS — H34832 Tributary (branch) retinal vein occlusion, left eye, with macular edema: Secondary | ICD-10-CM

## 2020-04-16 DIAGNOSIS — H34833 Tributary (branch) retinal vein occlusion, bilateral, with macular edema: Secondary | ICD-10-CM | POA: Diagnosis not present

## 2020-04-16 MED ORDER — BEVACIZUMAB CHEMO INJECTION 1.25MG/0.05ML SYRINGE FOR KALEIDOSCOPE
1.2500 mg | INTRAVITREAL | Status: AC | PRN
  Administered 2020-04-16: 1.25 mg via INTRAVITREAL

## 2020-04-16 NOTE — Assessment & Plan Note (Signed)
Missed recent follow-up due to Covid positive test, now recovered

## 2020-04-16 NOTE — Assessment & Plan Note (Signed)
Patient does report using nighttime oxygen

## 2020-04-16 NOTE — Progress Notes (Signed)
04/16/2020     CHIEF COMPLAINT Patient presents for Retina Follow Up   HISTORY OF PRESENT ILLNESS: [name] is a 79 y.o. male who presents to the clinic today for:   HPI    Retina Follow Up    Patient presents with  CRVO/BRVO.  In right eye.  This started 6 weeks ago.  Severity is mild.  Duration of 6 weeks.  Since onset it is stable.          Comments    6 WK F/U OD, POSS AVASTIN OD  Stable vision, no new F/F, no pain          Last edited by Varney Biles D on 04/16/2020  9:35 AM. (History)      Referring physician: [name], MD [address] [place],  [place] [address]  HISTORICAL INFORMATION:   Selected notes from the MEDICAL RECORD NUMBER       CURRENT MEDICATIONS: No current outpatient medications on file. (Ophthalmic Drugs)   No current facility-administered medications for this visit. (Ophthalmic Drugs)   Current Outpatient Medications (Other)  Medication Sig  . aspirin 81 MG tablet Take 81 mg by mouth daily.    . carvedilol (COREG) 12.5 MG tablet Take 1 tablet (12.5 mg total) by mouth 2 (two) times daily. (Patient taking differently: Take 6.25 mg by mouth 2 (two) times daily. )  . Cholecalciferol (VITAMIN D) 1000 UNITS capsule Take 1,000 Units by mouth daily.    . Coenzyme Q10 (COQ10) 100 MG CAPS Take 3 capsules by mouth daily.   . Cyanocobalamin (B-12) 1000 MCG CAPS Take 1 tablet by mouth daily.   Marland Kitchen levothyroxine (SYNTHROID, LEVOTHROID) 75 MCG tablet Take 1 tablet by mouth daily.  . Misc Natural Products (PROSTATE HEALTH PO) Take 1 tablet by mouth daily.   . Multiple Vitamin (MULTIVITAMIN) tablet Take 1 tablet by mouth daily.  . nitroGLYCERIN (NITROSTAT) 0.4 MG SL tablet Place 1 tablet under the tongue every 5 minutes as needed.  . Omega-3 Fatty Acids (FISH OIL) 1200 MG CAPS Take 1,200 mg by mouth 2 (two) times daily.   . rosuvastatin (CRESTOR) 5 MG tablet Take 1 tablet by mouth daily.  . tamsulosin (FLOMAX) 0.4 MG CAPS capsule Take  0.4 mg by mouth daily.    No current facility-administered medications for this visit. (Other)      REVIEW OF SYSTEMS:    ALLERGIES No Known Allergies  PAST MEDICAL HISTORY Past Medical History:  Diagnosis Date  . Anxiety   . Arthritis    neck; limited neck movement  . Chronic rhinitis   . Coronary artery disease   . Depression    no treatment since he retired  . Dyspnea    with exertion  . GERD (gastroesophageal reflux disease)   . Headache    hx subdural hematoma  . Hyperlipidemia   . Hypertension   . Hypothyroidism   . OSA (obstructive sleep apnea)   . Stroke [facility]) ~64yrs ago   mild affects Lt eye  . Subdural hematoma (HCC) 2004   Past Surgical History:  Procedure Laterality Date  . APPENDECTOMY  1970s  . BRAIN HEMATOMA EVACUATION  ~2005   subdural hematoma  . CARPAL TUNNEL RELEASE Right 05/21/2016   Procedure: CARPAL TUNNEL RELEASE, right;  Surgeon: Cindee Salt, MD;  Location: [facility];  Service: Orthopedics;  Laterality: Right;  FAB  . CATARACT EXTRACTION Left   . CORONARY ARTERY BYPASS GRAFT  ~2006  . Heart  bypass  2005  . ROTATOR CUFF REPAIR Right ~4 years    FAMILY HISTORY Family History  Problem Relation Age of Onset  . Heart disease Father   . Leukemia Son   . Stroke Mother     SOCIAL HISTORY Social History   Tobacco Use  . Smoking status: Never Smoker  . Smokeless tobacco: Never Used  Vaping Use  . Vaping Use: Never used  Substance Use Topics  . Alcohol use: No  . Drug use: No         OPHTHALMIC EXAM: Base Eye Exam    Visual Acuity (ETDRS)      Right Left   Dist [place] 20/20 20/20 -1       Tonometry (Tonopen, 9:35 AM)      Right Left   Pressure 10 13       Pupils      Pupils Dark Light Shape React APD   Right PERRL 3 2 Round Slow None   Left PERRL 3 2 Round Slow None       Visual Fields (Counting fingers)      Left Right    Full Full       Extraocular Movement      Right Left    Full Full        Neuro/Psych    Oriented x3: Yes   Mood/Affect: Normal       Dilation    Right eye: 1.0% Mydriacyl, 2.5% Phenylephrine @ 9:38 AM        Slit Lamp and Fundus Exam    External Exam      Right Left   External Normal Normal       Slit Lamp Exam      Right Left   Lids/Lashes Normal Normal   Conjunctiva/Sclera White and quiet White and quiet   Cornea Clear Clear   Anterior Chamber Deep and quiet Deep and quiet   Iris Round and reactive Round and reactive   Lens Posterior chamber intraocular lens Posterior chamber intraocular lens   Anterior Vitreous Normal Normal       Fundus Exam      Right Left   Posterior Vitreous Posterior vitreous detachment    Disc Normal    C/D Ratio 0.35    Macula Cystoid macular edema, Macular thickening, no exudates, Microaneurysms    Vessels Twigg macular branch retinal vein occlusion.    Periphery Normal           IMAGING AND PROCEDURES  Imaging and Procedures for 04/16/20  OCT, Retina - OU - Both Eyes       Right Eye Quality was good. Scan locations included subfoveal. Central Foveal Thickness: 435. Progression has worsened. Findings include cystoid macular edema.   Left Eye Quality was good. Scan locations included subfoveal. Central Foveal Thickness: 385. Progression has worsened. Findings include cystoid macular edema.   Notes Macular BRVO, at 6-week interval, will need to Repeat injection today and follow-up left eye soon after missed appointment       Intravitreal Injection, Pharmacologic Agent - OD - Right Eye       Time Out 04/16/2020. 10:36 AM. Confirmed correct patient, procedure, site, and patient consented.   Anesthesia Topical anesthesia was used. Anesthetic medications included Akten 3.5%.   Procedure Preparation included Tobramycin 0.3%, 10% betadine to eyelids, 5% betadine to ocular surface, Ofloxacin . A 30 gauge needle was used.   Injection:  1.25 mg Bevacizumab (AVASTIN) SOLN   NDC: 70360-001-02, Lot:  [id number]   Route: Intravitreal, Site: Right Eye, Waste: 0 mg  Post-op Post injection exam found visual acuity of at least counting fingers. The patient tolerated the procedure well. There were no complications. The patient received written and verbal post procedure care education. Post injection medications were not given.                 ASSESSMENT/PLAN:  OBSTRUCTIVE SLEEP APNEA Patient does report using nighttime oxygen  Branch retinal vein occlusion with macular edema of left eye Missed recent follow-up due to Covid positive test, now recovered      ICD-10-CM   1. Branch retinal vein occlusion with macular edema of right eye  H34.8310 OCT, Retina - OU - Both Eyes    Intravitreal Injection, Pharmacologic Agent - OD - Right Eye    Bevacizumab (AVASTIN) SOLN 1.25 mg  2. OBSTRUCTIVE SLEEP APNEA  G47.33   3. Branch retinal vein occlusion with macular edema of left eye  H34.8320     1.  Repeat intravitreal Avastin OD today up in 6 weeks  2.  We will need to reschedule follow-up for left eye missed recently due to a Covid positive test some 3 weeks prior to now  3.  Ophthalmic Meds Ordered this visit:  Meds ordered this encounter  Medications  . Bevacizumab (AVASTIN) SOLN 1.25 mg       Return in about 2 weeks (around 04/30/2020) for dilate, OS, AVASTIN OCT.  There are no Patient Instructions on file for this visit.   Explained the diagnoses, plan, and follow up with the patient and they expressed understanding.  Patient expressed understanding of the importance of proper follow up care.   Alford Highland Julias Mould M.D. Diseases & Surgery of the Retina and Vitreous [facility] 04/16/20     Abbreviations: M myopia (nearsighted); A astigmatism; H hyperopia (farsighted); P presbyopia; Mrx spectacle prescription;  CTL contact lenses; OD right eye; OS left eye; OU both eyes  XT exotropia; ET esotropia; PEK punctate epithelial keratitis; PEE punctate epithelial  erosions; DES dry eye syndrome; MGD meibomian gland dysfunction; ATs artificial tears; PFAT's preservative free artificial tears; NSC nuclear sclerotic cataract; PSC posterior subcapsular cataract; ERM epi-retinal membrane; PVD posterior vitreous detachment; RD retinal detachment; DM diabetes mellitus; DR diabetic retinopathy; NPDR non-proliferative diabetic retinopathy; PDR proliferative diabetic retinopathy; CSME clinically significant macular edema; DME diabetic macular edema; dbh dot blot hemorrhages; CWS cotton wool spot; POAG primary open angle glaucoma; C/D cup-to-disc ratio; HVF humphrey visual field; GVF goldmann visual field; OCT optical coherence tomography; IOP intraocular pressure; BRVO Branch retinal vein occlusion; CRVO central retinal vein occlusion; CRAO central retinal artery occlusion; BRAO branch retinal artery occlusion; RT retinal tear; SB scleral buckle; PPV pars plana vitrectomy; VH Vitreous hemorrhage; PRP panretinal laser photocoagulation; IVK intravitreal kenalog; VMT vitreomacular traction; MH Macular hole;  NVD neovascularization of the disc; NVE neovascularization elsewhere; AREDS age related eye disease study; ARMD age related macular degeneration; POAG primary open angle glaucoma; EBMD epithelial/anterior basement membrane dystrophy; ACIOL anterior chamber intraocular lens; IOL intraocular lens; PCIOL posterior chamber intraocular lens; Phaco/IOL phacoemulsification with intraocular lens placement; PRK photorefractive keratectomy; LASIK laser assisted in situ keratomileusis; HTN hypertension; DM diabetes mellitus; COPD chronic obstructive pulmonary disease

## 2020-04-19 DIAGNOSIS — G4733 Obstructive sleep apnea (adult) (pediatric): Secondary | ICD-10-CM | POA: Diagnosis not present

## 2020-04-30 ENCOUNTER — Other Ambulatory Visit: Payer: Self-pay

## 2020-04-30 ENCOUNTER — Ambulatory Visit (INDEPENDENT_AMBULATORY_CARE_PROVIDER_SITE_OTHER): Payer: Medicare PPO | Admitting: Ophthalmology

## 2020-04-30 ENCOUNTER — Encounter (INDEPENDENT_AMBULATORY_CARE_PROVIDER_SITE_OTHER): Payer: Self-pay | Admitting: Ophthalmology

## 2020-04-30 DIAGNOSIS — H34832 Tributary (branch) retinal vein occlusion, left eye, with macular edema: Secondary | ICD-10-CM

## 2020-04-30 DIAGNOSIS — H34831 Tributary (branch) retinal vein occlusion, right eye, with macular edema: Secondary | ICD-10-CM

## 2020-04-30 MED ORDER — BEVACIZUMAB CHEMO INJECTION 1.25MG/0.05ML SYRINGE FOR KALEIDOSCOPE
1.2500 mg | INTRAVITREAL | Status: AC | PRN
  Administered 2020-04-30: 1.25 mg via INTRAVITREAL

## 2020-04-30 NOTE — Progress Notes (Signed)
04/30/2020     CHIEF COMPLAINT Patient presents for Retina Follow Up   HISTORY OF PRESENT ILLNESS: [name] is a 79 y.o. male who presents to the clinic today for:   HPI    Retina Follow Up    Patient presents with  CRVO/BRVO.  In left eye.  This started 11 weeks ago.  Duration of 11 weeks.          Comments    11 WK F/U OS, POSS AVASTIN OS   Pt reports vision worsening OS, OD stable. No new F/F, no pain or pressure.        Last edited by Varney Biles D on 04/30/2020  9:28 AM. (History)      Referring physician: [name], MD [address] [place],  [place] [address]  HISTORICAL INFORMATION:   Selected notes from the MEDICAL RECORD NUMBER       CURRENT MEDICATIONS: No current outpatient medications on file. (Ophthalmic Drugs)   No current facility-administered medications for this visit. (Ophthalmic Drugs)   Current Outpatient Medications (Other)  Medication Sig  . aspirin 81 MG tablet Take 81 mg by mouth daily.    . carvedilol (COREG) 12.5 MG tablet Take 1 tablet (12.5 mg total) by mouth 2 (two) times daily. (Patient taking differently: Take 6.25 mg by mouth 2 (two) times daily. )  . Cholecalciferol (VITAMIN D) 1000 UNITS capsule Take 1,000 Units by mouth daily.    . Coenzyme Q10 (COQ10) 100 MG CAPS Take 3 capsules by mouth daily.   . Cyanocobalamin (B-12) 1000 MCG CAPS Take 1 tablet by mouth daily.   Marland Kitchen levothyroxine (SYNTHROID, LEVOTHROID) 75 MCG tablet Take 1 tablet by mouth daily.  . Misc Natural Products (PROSTATE HEALTH PO) Take 1 tablet by mouth daily.   . Multiple Vitamin (MULTIVITAMIN) tablet Take 1 tablet by mouth daily.  . nitroGLYCERIN (NITROSTAT) 0.4 MG SL tablet Place 1 tablet under the tongue every 5 minutes as needed.  . Omega-3 Fatty Acids (FISH OIL) 1200 MG CAPS Take 1,200 mg by mouth 2 (two) times daily.   . rosuvastatin (CRESTOR) 5 MG tablet Take 1 tablet by mouth daily.  . tamsulosin (FLOMAX) 0.4 MG CAPS capsule Take 0.4 mg  by mouth daily.    No current facility-administered medications for this visit. (Other)      REVIEW OF SYSTEMS:    ALLERGIES No Known Allergies  PAST MEDICAL HISTORY Past Medical History:  Diagnosis Date  . Anxiety   . Arthritis    neck; limited neck movement  . Chronic rhinitis   . Coronary artery disease   . Depression    no treatment since he retired  . Dyspnea    with exertion  . GERD (gastroesophageal reflux disease)   . Headache    hx subdural hematoma  . Hyperlipidemia   . Hypertension   . Hypothyroidism   . OSA (obstructive sleep apnea)   . Stroke [facility]) ~11yrs ago   mild affects Lt eye  . Subdural hematoma (HCC) 2004   Past Surgical History:  Procedure Laterality Date  . APPENDECTOMY  1970s  . BRAIN HEMATOMA EVACUATION  ~2005   subdural hematoma  . CARPAL TUNNEL RELEASE Right 05/21/2016   Procedure: CARPAL TUNNEL RELEASE, right;  Surgeon: Cindee Salt, MD;  Location: [facility];  Service: Orthopedics;  Laterality: Right;  FAB  . CATARACT EXTRACTION Left   . CORONARY ARTERY BYPASS GRAFT  ~2006  . Heart bypass  2005  .  ROTATOR CUFF REPAIR Right ~4 years    FAMILY HISTORY Family History  Problem Relation Age of Onset  . Heart disease Father   . Leukemia Son   . Stroke Mother     SOCIAL HISTORY Social History   Tobacco Use  . Smoking status: Never Smoker  . Smokeless tobacco: Never Used  Vaping Use  . Vaping Use: Never used  Substance Use Topics  . Alcohol use: No  . Drug use: No         OPHTHALMIC EXAM: Base Eye Exam    Visual Acuity (ETDRS)      Right Left   Dist [place] 20/20 20/20       Tonometry (Tonopen, 9:33 AM)      Right Left   Pressure 13 11       Pupils      Pupils Dark Light Shape React APD   Right PERRL 3 2 Round Slow None   Left PERRL 3 2 Round Slow None       Visual Fields (Counting fingers)      Left Right    Full Full       Extraocular Movement      Right Left    Full Full        Neuro/Psych    Oriented x3: Yes   Mood/Affect: Normal       Dilation    Left eye: 1.0% Mydriacyl, 2.5% Phenylephrine @ 9:33 AM        Slit Lamp and Fundus Exam    External Exam      Right Left   External Normal Normal       Slit Lamp Exam      Right Left   Lids/Lashes Normal Normal   Conjunctiva/Sclera White and quiet White and quiet   Cornea Clear Clear   Anterior Chamber Deep and quiet Deep and quiet   Iris Round and reactive Round and reactive   Lens Posterior chamber intraocular lens Posterior chamber intraocular lens   Anterior Vitreous Normal Normal       Fundus Exam      Right Left   Posterior Vitreous  Posterior vitreous detachment   Disc  Pallor 1+   C/D Ratio  0.6   Macula  Microaneurysms along the inferotemporal border of the FAZ, Macular thickening, Hard drusen, no exudates, no atrophy   Vessels  Old macular branch retinal vein occlusion   Periphery  Normal          IMAGING AND PROCEDURES  Imaging and Procedures for 04/30/20  OCT, Retina - OU - Both Eyes       Right Eye Quality was good. Scan locations included subfoveal. Central Foveal Thickness: 385. Progression has improved. Findings include cystoid macular edema.   Left Eye Quality was good. Scan locations included subfoveal. Central Foveal Thickness: 378. Progression has improved. Findings include cystoid macular edema.   Notes CME OS overall improved at 11-week interval from macular BRVO will repeat injection today and maintain the interval.  OD, much less CME from macular BRVO at 2 weeks post injection.       Intravitreal Injection, Pharmacologic Agent - OS - Left Eye       Time Out 04/30/2020. 10:09 AM. Confirmed correct patient, procedure, site, and patient consented.   Anesthesia Topical anesthesia was used.   Procedure Preparation included Ofloxacin , 10% betadine to eyelids, 5% betadine to ocular surface. A 30 gauge needle was used.   Injection:  1.25 mg Bevacizumab  (  Jackquline [place]   NDC: 03474-259-56, Lot: [id number]   Route: Intravitreal, Site: Left Eye, Waste: 0 mg  Post-op Post injection exam found visual acuity of at least counting fingers. The patient tolerated the procedure well. There were no complications. The patient received written and verbal post procedure care education. Post injection medications were not given.   Notes Leiter's 0.1 cc                ASSESSMENT/PLAN:  Branch retinal vein occlusion with macular edema of left eye Chronic active CME from macular BRVO, yet overall stable and improved 11 weeks will maintain current interval after repeat injection today  Branch retinal vein occlusion with macular edema of right eye OD, less CME 2 weeks post injection, will follow-up as scheduled      ICD-10-CM   1. Branch retinal vein occlusion with macular edema of left eye  H34.8320 OCT, Retina - OU - Both Eyes    Intravitreal Injection, Pharmacologic Agent - OS - Left Eye    Bevacizumab (AVASTIN) SOLN 1.25 mg  2. Branch retinal vein occlusion with macular edema of right eye  H34.8310     1.  Chronic active CME OS, yet stable and improved at 11 weeks, will repeat injection today and repeat examination similar interval next  2.  OD, 2 weeks post recent injection for macular BRVO, less CME, follow-up visit as scheduled  3.  Ophthalmic Meds Ordered this visit:  Meds ordered this encounter  Medications  . Bevacizumab (AVASTIN) SOLN 1.25 mg       Return in about 11 weeks (around 07/16/2020) for dilate, OS, AVASTIN OCT.  There are no Patient Instructions on file for this visit.   Explained the diagnoses, plan, and follow up with the patient and they expressed understanding.  Patient expressed understanding of the importance of proper follow up care.   Alford Highland Yadiel Aubry M.D. Diseases & Surgery of the Retina and Vitreous [facility] 04/30/20     Abbreviations: M myopia (nearsighted); A astigmatism; H  hyperopia (farsighted); P presbyopia; Mrx spectacle prescription;  CTL contact lenses; OD right eye; OS left eye; OU both eyes  XT exotropia; ET esotropia; PEK punctate epithelial keratitis; PEE punctate epithelial erosions; DES dry eye syndrome; MGD meibomian gland dysfunction; ATs artificial tears; PFAT's preservative free artificial tears; NSC nuclear sclerotic cataract; PSC posterior subcapsular cataract; ERM epi-retinal membrane; PVD posterior vitreous detachment; RD retinal detachment; DM diabetes mellitus; DR diabetic retinopathy; NPDR non-proliferative diabetic retinopathy; PDR proliferative diabetic retinopathy; CSME clinically significant macular edema; DME diabetic macular edema; dbh dot blot hemorrhages; CWS cotton wool spot; POAG primary open angle glaucoma; C/D cup-to-disc ratio; HVF humphrey visual field; GVF goldmann visual field; OCT optical coherence tomography; IOP intraocular pressure; BRVO Branch retinal vein occlusion; CRVO central retinal vein occlusion; CRAO central retinal artery occlusion; BRAO branch retinal artery occlusion; RT retinal tear; SB scleral buckle; PPV pars plana vitrectomy; VH Vitreous hemorrhage; PRP panretinal laser photocoagulation; IVK intravitreal kenalog; VMT vitreomacular traction; MH Macular hole;  NVD neovascularization of the disc; NVE neovascularization elsewhere; AREDS age related eye disease study; ARMD age related macular degeneration; POAG primary open angle glaucoma; EBMD epithelial/anterior basement membrane dystrophy; ACIOL anterior chamber intraocular lens; IOL intraocular lens; PCIOL posterior chamber intraocular lens; Phaco/IOL phacoemulsification with intraocular lens placement; PRK photorefractive keratectomy; LASIK laser assisted in situ keratomileusis; HTN hypertension; DM diabetes mellitus; COPD chronic obstructive pulmonary disease

## 2020-04-30 NOTE — Assessment & Plan Note (Signed)
OD, less CME 2 weeks post injection, will follow-up as scheduled

## 2020-04-30 NOTE — Assessment & Plan Note (Signed)
Chronic active CME from macular BRVO, yet overall stable and improved 11 weeks will maintain current interval after repeat injection today

## 2020-05-01 HISTORY — PX: OTHER SURGICAL HISTORY: SHX169

## 2020-05-13 DIAGNOSIS — K811 Chronic cholecystitis: Secondary | ICD-10-CM | POA: Diagnosis not present

## 2020-05-13 DIAGNOSIS — K828 Other specified diseases of gallbladder: Secondary | ICD-10-CM

## 2020-05-13 HISTORY — DX: Chronic cholecystitis: K81.1

## 2020-05-13 HISTORY — DX: Other specified diseases of gallbladder: K82.8

## 2020-05-14 ENCOUNTER — Telehealth: Payer: Self-pay

## 2020-05-14 DIAGNOSIS — I2510 Atherosclerotic heart disease of native coronary artery without angina pectoris: Secondary | ICD-10-CM

## 2020-05-14 DIAGNOSIS — Z01818 Encounter for other preprocedural examination: Secondary | ICD-10-CM

## 2020-05-14 NOTE — Telephone Encounter (Signed)
Called and informed wife of restrictions for stress test. She is aware of appointment. No further question.

## 2020-05-14 NOTE — Telephone Encounter (Signed)
Will send FYI to requesting office pt has stress test for pre op assessment 05/16/20.

## 2020-05-14 NOTE — Telephone Encounter (Signed)
He needs to have a stress test done before surgery surgery is on 29 December lets make sure he will have a stress test before that time Lexiscan, moderate risk

## 2020-05-14 NOTE — Telephone Encounter (Signed)
   [facility] Pre-operative Risk Assessment    Request for surgical clearance:  1. What type of surgery is being performed? Laparoscopic Cholecystectomy   2. When is this surgery scheduled? 05/29/2020   3. What type of clearance is required (medical clearance vs. Pharmacy clearance to hold med vs. Both)? Both  4. Are there any medications that need to be held prior to surgery and how long?ASA (time frame not specified)  5. Practice name and name of physician performing surgery? [facility] with Dr. Jerel Shepherd   6. What is your office phone [phone]    7.   What is your office fax number: [phone]  8.   Anesthesia type (None, local, MAC, general) ? General Anesthesia   Basil Dess River Ambrosio 05/14/2020, 11:39 AM  _________________________________________________________________   (provider comments below)

## 2020-05-14 NOTE — Telephone Encounter (Signed)
Pt has a history of CABG. We are asked to hold ASA for lap chole. Can you please provide recommendations on holding ASA?   I have medically cleared him with 4.0 METS (stairs, moderate house work).

## 2020-05-14 NOTE — Telephone Encounter (Signed)
Called patient wife spoke to her per dpr. Advised her patient needs to have stress test before being cleared for surgery. She understood. Reached out to schedulers to get him scheduled.

## 2020-05-14 NOTE — Telephone Encounter (Signed)
Pt needs stress test before we can clear, scheduled for 05/16/20  Please let surgeon's office know. Thanks

## 2020-05-15 ENCOUNTER — Other Ambulatory Visit: Payer: Self-pay | Admitting: *Deleted

## 2020-05-15 NOTE — Addendum Note (Signed)
Addended by: [name] on: 05/15/2020 10:50 AM   Modules accepted: Orders

## 2020-05-15 NOTE — Addendum Note (Signed)
Addended by: Hazle Quant on: 05/15/2020 08:53 AM   Modules accepted: Orders

## 2020-05-15 NOTE — Addendum Note (Signed)
Addended by: [name] on: 05/15/2020 02:26 PM   Modules accepted: Orders

## 2020-05-16 ENCOUNTER — Other Ambulatory Visit: Payer: Self-pay

## 2020-05-16 ENCOUNTER — Ambulatory Visit (HOSPITAL_COMMUNITY): Payer: Medicare PPO | Attending: Cardiovascular Disease

## 2020-05-16 DIAGNOSIS — Z01818 Encounter for other preprocedural examination: Secondary | ICD-10-CM | POA: Diagnosis not present

## 2020-05-16 DIAGNOSIS — I2510 Atherosclerotic heart disease of native coronary artery without angina pectoris: Secondary | ICD-10-CM | POA: Insufficient documentation

## 2020-05-16 LAB — MYOCARDIAL PERFUSION IMAGING
LV dias vol: 60 mL (ref 62–150)
LV sys vol: 17 mL
Peak HR: 88 {beats}/min
Rest HR: 68 {beats}/min
SRS: 0
SSS: 0
TID: 0.93

## 2020-05-16 MED ORDER — TECHNETIUM TC 99M TETROFOSMIN IV KIT
30.2000 | PACK | Freq: Once | INTRAVENOUS | Status: AC | PRN
  Administered 2020-05-16: 30.2 via INTRAVENOUS
  Filled 2020-05-16: qty 31

## 2020-05-16 MED ORDER — REGADENOSON 0.4 MG/5ML IV SOLN
0.4000 mg | Freq: Once | INTRAVENOUS | Status: AC
  Administered 2020-05-16: 0.4 mg via INTRAVENOUS

## 2020-05-16 MED ORDER — TECHNETIUM TC 99M TETROFOSMIN IV KIT
10.1000 | PACK | Freq: Once | INTRAVENOUS | Status: AC | PRN
  Administered 2020-05-16: 10.1 via INTRAVENOUS
  Filled 2020-05-16: qty 11

## 2020-05-19 DIAGNOSIS — G4733 Obstructive sleep apnea (adult) (pediatric): Secondary | ICD-10-CM | POA: Diagnosis not present

## 2020-05-20 NOTE — Telephone Encounter (Signed)
   Primary Cardiologist: [name], MD  Chart reviewed as part of pre-operative protocol coverage. Given past medical history and time since last visit, based on ACC/AHA guidelines, [name] would be at acceptable risk for the planned procedure without further cardiovascular testing.   I will route this recommendation to the requesting party via Epic fax function and remove from pre-op pool.  Please call with questions.  Thomasene Ripple. Worthy Boschert NP-C    05/20/2020, 7:33 AM [facility] [address] Office [phone] Fax [phone]

## 2020-05-23 DIAGNOSIS — Z1152 Encounter for screening for COVID-19: Secondary | ICD-10-CM | POA: Diagnosis not present

## 2020-05-29 DIAGNOSIS — E039 Hypothyroidism, unspecified: Secondary | ICD-10-CM | POA: Diagnosis not present

## 2020-05-29 DIAGNOSIS — K8020 Calculus of gallbladder without cholecystitis without obstruction: Secondary | ICD-10-CM | POA: Diagnosis not present

## 2020-05-29 DIAGNOSIS — I2510 Atherosclerotic heart disease of native coronary artery without angina pectoris: Secondary | ICD-10-CM | POA: Diagnosis not present

## 2020-05-29 DIAGNOSIS — Z8719 Personal history of other diseases of the digestive system: Secondary | ICD-10-CM | POA: Diagnosis not present

## 2020-05-29 DIAGNOSIS — K828 Other specified diseases of gallbladder: Secondary | ICD-10-CM | POA: Diagnosis not present

## 2020-05-29 DIAGNOSIS — I10 Essential (primary) hypertension: Secondary | ICD-10-CM | POA: Diagnosis not present

## 2020-05-29 DIAGNOSIS — K811 Chronic cholecystitis: Secondary | ICD-10-CM | POA: Diagnosis not present

## 2020-05-29 DIAGNOSIS — E785 Hyperlipidemia, unspecified: Secondary | ICD-10-CM | POA: Diagnosis not present

## 2020-06-04 ENCOUNTER — Encounter (INDEPENDENT_AMBULATORY_CARE_PROVIDER_SITE_OTHER): Payer: Medicare PPO | Admitting: Ophthalmology

## 2020-06-04 ENCOUNTER — Encounter (INDEPENDENT_AMBULATORY_CARE_PROVIDER_SITE_OTHER): Payer: Self-pay

## 2020-06-10 DIAGNOSIS — Z09 Encounter for follow-up examination after completed treatment for conditions other than malignant neoplasm: Secondary | ICD-10-CM

## 2020-06-10 HISTORY — DX: Encounter for follow-up examination after completed treatment for conditions other than malignant neoplasm: Z09

## 2020-06-11 ENCOUNTER — Ambulatory Visit (INDEPENDENT_AMBULATORY_CARE_PROVIDER_SITE_OTHER): Payer: Medicare PPO | Admitting: Ophthalmology

## 2020-06-11 ENCOUNTER — Encounter (INDEPENDENT_AMBULATORY_CARE_PROVIDER_SITE_OTHER): Payer: Self-pay | Admitting: Ophthalmology

## 2020-06-11 ENCOUNTER — Other Ambulatory Visit: Payer: Self-pay

## 2020-06-11 DIAGNOSIS — H353112 Nonexudative age-related macular degeneration, right eye, intermediate dry stage: Secondary | ICD-10-CM

## 2020-06-11 DIAGNOSIS — H34831 Tributary (branch) retinal vein occlusion, right eye, with macular edema: Secondary | ICD-10-CM

## 2020-06-11 MED ORDER — BEVACIZUMAB 2.5 MG/0.1ML IZ SOSY
2.5000 mg | PREFILLED_SYRINGE | INTRAVITREAL | Status: AC | PRN
  Administered 2020-06-11: 2.5 mg via INTRAVITREAL

## 2020-06-11 NOTE — Assessment & Plan Note (Signed)
No active disease ODThe nature of age--related macular degeneration was discussed with the patient as well as the distinction between dry and wet types. Checking an Amsler Grid daily with advice to return immediately should a distortion develop, was given to the patient. The patient 's smoking status now and in the past was determined and advice based on the AREDS study was provided regarding the consumption of antioxidant supplements. AREDS 2 vitamin formulation was recommended. Consumption of dark leafy vegetables and fresh fruits of various colors was recommended. Treatment modalities for wet macular degeneration particularly the use of intravitreal injections of anti-blood vessel growth factors was discussed with the patient. Avastin, Lucentis, and Eylea are the available options. On occasion, therapy includes the use of photodynamic therapy and thermal laser. Stressed to the patient do not rub eyes.  Patient was advised to check Amsler Grid daily and return immediately if changes are noted. Instructions on using the grid were given to the patient. All patient questions were answered.

## 2020-06-11 NOTE — Progress Notes (Signed)
06/11/2020     CHIEF COMPLAINT Patient presents for Retina Follow Up (6 WK FU OD, POSS AVASTIN OD///Pt reports stable vision OD, no new F/F OD, no pain or pressure OD. )   HISTORY OF PRESENT ILLNESS: [name] is a 80 y.o. male who presents to the clinic today for:   HPI    Retina Follow Up    Patient presents with  CRVO/BRVO.  In right eye.  This started 6 weeks ago.  Duration of 6 weeks.  Since onset it is stable. Additional comments: 6 WK FU OD, POSS AVASTIN OD   Pt reports stable vision OD, no new F/F OD, no pain or pressure OD.        Last edited by Varney Biles D on 06/11/2020  9:22 AM. (History)      Referring physician: [name], MD [address] [place],  [place] [address]  HISTORICAL INFORMATION:   Selected notes from the MEDICAL RECORD NUMBER       CURRENT MEDICATIONS: No current outpatient medications on file. (Ophthalmic Drugs)   No current facility-administered medications for this visit. (Ophthalmic Drugs)   Current Outpatient Medications (Other)  Medication Sig  . aspirin 81 MG tablet Take 81 mg by mouth daily.    . carvedilol (COREG) 12.5 MG tablet Take 1 tablet (12.5 mg total) by mouth 2 (two) times daily. (Patient taking differently: Take 6.25 mg by mouth 2 (two) times daily. )  . Cholecalciferol (VITAMIN D) 1000 UNITS capsule Take 1,000 Units by mouth daily.    . Coenzyme Q10 (COQ10) 100 MG CAPS Take 3 capsules by mouth daily.   . Cyanocobalamin (B-12) 1000 MCG CAPS Take 1 tablet by mouth daily.   Marland Kitchen levothyroxine (SYNTHROID, LEVOTHROID) 75 MCG tablet Take 1 tablet by mouth daily.  . Misc Natural Products (PROSTATE HEALTH PO) Take 1 tablet by mouth daily.   . Multiple Vitamin (MULTIVITAMIN) tablet Take 1 tablet by mouth daily.  . nitroGLYCERIN (NITROSTAT) 0.4 MG SL tablet Place 1 tablet under the tongue every 5 minutes as needed.  . Omega-3 Fatty Acids (FISH OIL) 1200 MG CAPS Take 1,200 mg by mouth 2 (two) times daily.   .  rosuvastatin (CRESTOR) 5 MG tablet Take 1 tablet by mouth daily.  . tamsulosin (FLOMAX) 0.4 MG CAPS capsule Take 0.4 mg by mouth daily.    No current facility-administered medications for this visit. (Other)      REVIEW OF SYSTEMS:    ALLERGIES No Known Allergies  PAST MEDICAL HISTORY Past Medical History:  Diagnosis Date  . Anxiety   . Arthritis    neck; limited neck movement  . Chronic rhinitis   . Coronary artery disease   . Depression    no treatment since he retired  . Dyspnea    with exertion  . GERD (gastroesophageal reflux disease)   . Headache    hx subdural hematoma  . Hyperlipidemia   . Hypertension   . Hypothyroidism   . OSA (obstructive sleep apnea)   . Stroke [facility] ~20yrs ago   mild affects Lt eye  . Subdural hematoma (HCC) 2004   Past Surgical History:  Procedure Laterality Date  . APPENDECTOMY  1970s  . BRAIN HEMATOMA EVACUATION  ~2005   subdural hematoma  . CARPAL TUNNEL RELEASE Right 05/21/2016   Procedure: CARPAL TUNNEL RELEASE, right;  Surgeon: Cindee Salt, MD;  Location: [facility];  Service: Orthopedics;  Laterality: Right;  FAB  . CATARACT EXTRACTION Left   .  CORONARY ARTERY BYPASS GRAFT  ~2006  . Heart bypass  2005  . ROTATOR CUFF REPAIR Right ~4 years    FAMILY HISTORY Family History  Problem Relation Age of Onset  . Heart disease Father   . Leukemia Son   . Stroke Mother     SOCIAL HISTORY Social History   Tobacco Use  . Smoking status: Never Smoker  . Smokeless tobacco: Never Used  Vaping Use  . Vaping Use: Never used  Substance Use Topics  . Alcohol use: No  . Drug use: No         OPHTHALMIC EXAM:  Base Eye Exam    Visual Acuity (ETDRS)      Right Left   Dist [place] 20/20 20/20       Tonometry (Tonopen, 9:26 AM)      Right Left   Pressure 13 13       Pupils      Dark Light Shape React APD   Right 3 2 Round Slow None   Left 3 2 Round Slow None       Visual Fields (Counting fingers)       Left Right    Full Full       Extraocular Movement      Right Left    Full Full       Neuro/Psych    Oriented x3: Yes   Mood/Affect: Normal       Dilation    Right eye: 1.0% Mydriacyl, 2.5% Phenylephrine @ 9:26 AM        Slit Lamp and Fundus Exam    External Exam      Right Left   External Normal Normal       Slit Lamp Exam      Right Left   Lids/Lashes Normal Normal   Conjunctiva/Sclera White and quiet White and quiet   Cornea Clear Clear   Anterior Chamber Deep and quiet Deep and quiet   Iris Round and reactive Round and reactive   Lens Posterior chamber intraocular lens Posterior chamber intraocular lens   Anterior Vitreous Normal Normal       Fundus Exam      Right Left   Posterior Vitreous Posterior vitreous detachment    Disc Normal    C/D Ratio 0.35    Macula Cystoid macular edema, Macular thickening, no exudates, Microaneurysms    Vessels Twig macular branch retinal vein occlusion.    Periphery Normal           IMAGING AND PROCEDURES  Imaging and Procedures for 06/11/20  OCT, Retina - OU - Both Eyes       Right Eye Quality was good. Scan locations included subfoveal. Progression has been stable. Findings include abnormal foveal contour, cystoid macular edema.   Left Eye Quality was good. Scan locations included subfoveal. Progression has improved. Findings include abnormal foveal contour.   Notes CME OD, juxta foveal from macular twig branch retinal vein occlusion. Stable overall currently at 6-week follow-up interval  OS improved at current interval post recent injection Avastin, 8 weeks previous OS       Intravitreal Injection, Pharmacologic Agent - OD - Right Eye       Time Out 06/11/2020. 10:28 AM. Confirmed correct patient, procedure, site, and patient consented.   Anesthesia Topical anesthesia was used. Anesthetic medications included Akten 3.5%.   Procedure Preparation included Tobramycin 0.3%, 10% betadine to eyelids, 5%  betadine to ocular surface, Ofloxacin . A 30 gauge needle was  used.   Injection:  2.5 mg Bevacizumab (AVASTIN) 2.5mg /0.59mL SOSY   NDC: 16967-893-81, Lot: [id number]   Route: Intravitreal, Site: Right Eye  Post-op Post injection exam found visual acuity of at least counting fingers. The patient tolerated the procedure well. There were no complications. The patient received written and verbal post procedure care education. Post injection medications were not given.                 ASSESSMENT/PLAN:  Intermediate stage nonexudative age-related macular degeneration of right eye No active disease ODThe nature of age--related macular degeneration was discussed with the patient as well as the distinction between dry and wet types. Checking an Amsler Grid daily with advice to return immediately should a distortion develop, was given to the patient. The patient 's smoking status now and in the past was determined and advice based on the AREDS study was provided regarding the consumption of antioxidant supplements. AREDS 2 vitamin formulation was recommended. Consumption of dark leafy vegetables and fresh fruits of various colors was recommended. Treatment modalities for wet macular degeneration particularly the use of intravitreal injections of anti-blood vessel growth factors was discussed with the patient. Avastin, Lucentis, and Eylea are the available options. On occasion, therapy includes the use of photodynamic therapy and thermal laser. Stressed to the patient do not rub eyes.  Patient was advised to check Amsler Grid daily and return immediately if changes are noted. Instructions on using the grid were given to the patient. All patient questions were answered.      ICD-10-CM   1. Branch retinal vein occlusion with macular edema of right eye  H34.8310 OCT, Retina - OU - Both Eyes    Intravitreal Injection, Pharmacologic Agent - OD - Right Eye    bevacizumab (AVASTIN) SOSY 2.5 mg  2. Intermediate  stage nonexudative age-related macular degeneration of right eye  H35.3112     1. Repeat intravitreal Avastin OD for chronic recurrent CME secondary to twig macular branch retinal vein occlusion. Follow-up in 6 weeks  2. Dilate OS next as scheduled, February 2022  3.  Ophthalmic Meds Ordered this visit:  Meds ordered this encounter  Medications  . bevacizumab (AVASTIN) SOSY 2.5 mg       Return in about 6 weeks (around 07/23/2020) for OD, AVASTIN OCT.  Patient Instructions  Patient asked to report new onset visual daily declines or distortions patient instructed to report any new onset visual cue to distortions or declines    Explained the diagnoses, plan, and follow up with the patient and they expressed understanding.  Patient expressed understanding of the importance of proper follow up care.   Alford Highland Jeancarlo Leffler M.D. Diseases & Surgery of the Retina and Vitreous [facility] 06/11/20     Abbreviations: M myopia (nearsighted); A astigmatism; H hyperopia (farsighted); P presbyopia; Mrx spectacle prescription;  CTL contact lenses; OD right eye; OS left eye; OU both eyes  XT exotropia; ET esotropia; PEK punctate epithelial keratitis; PEE punctate epithelial erosions; DES dry eye syndrome; MGD meibomian gland dysfunction; ATs artificial tears; PFAT's preservative free artificial tears; NSC nuclear sclerotic cataract; PSC posterior subcapsular cataract; ERM epi-retinal membrane; PVD posterior vitreous detachment; RD retinal detachment; DM diabetes mellitus; DR diabetic retinopathy; NPDR non-proliferative diabetic retinopathy; PDR proliferative diabetic retinopathy; CSME clinically significant macular edema; DME diabetic macular edema; dbh dot blot hemorrhages; CWS cotton wool spot; POAG primary open angle glaucoma; C/D cup-to-disc ratio; HVF humphrey visual field; GVF goldmann visual field; OCT optical coherence  tomography; IOP intraocular pressure; BRVO Branch retinal vein  occlusion; CRVO central retinal vein occlusion; CRAO central retinal artery occlusion; BRAO branch retinal artery occlusion; RT retinal tear; SB scleral buckle; PPV pars plana vitrectomy; VH Vitreous hemorrhage; PRP panretinal laser photocoagulation; IVK intravitreal kenalog; VMT vitreomacular traction; MH Macular hole;  NVD neovascularization of the disc; NVE neovascularization elsewhere; AREDS age related eye disease study; ARMD age related macular degeneration; POAG primary open angle glaucoma; EBMD epithelial/anterior basement membrane dystrophy; ACIOL anterior chamber intraocular lens; IOL intraocular lens; PCIOL posterior chamber intraocular lens; Phaco/IOL phacoemulsification with intraocular lens placement; [place] photorefractive keratectomy; LASIK laser assisted in situ keratomileusis; HTN hypertension; DM diabetes mellitus; COPD chronic obstructive pulmonary disease

## 2020-06-11 NOTE — Patient Instructions (Signed)
Patient asked to report new onset visual daily declines or distortions patient instructed to report any new onset visual cue to distortions or declines

## 2020-06-13 DIAGNOSIS — Z79899 Other long term (current) drug therapy: Secondary | ICD-10-CM | POA: Diagnosis not present

## 2020-06-13 DIAGNOSIS — R7301 Impaired fasting glucose: Secondary | ICD-10-CM | POA: Diagnosis not present

## 2020-06-13 DIAGNOSIS — I10 Essential (primary) hypertension: Secondary | ICD-10-CM | POA: Diagnosis not present

## 2020-06-13 DIAGNOSIS — I25119 Atherosclerotic heart disease of native coronary artery with unspecified angina pectoris: Secondary | ICD-10-CM | POA: Diagnosis not present

## 2020-06-13 DIAGNOSIS — E785 Hyperlipidemia, unspecified: Secondary | ICD-10-CM | POA: Diagnosis not present

## 2020-06-13 DIAGNOSIS — Z6831 Body mass index (BMI) 31.0-31.9, adult: Secondary | ICD-10-CM | POA: Diagnosis not present

## 2020-06-13 DIAGNOSIS — E039 Hypothyroidism, unspecified: Secondary | ICD-10-CM | POA: Diagnosis not present

## 2020-06-19 DIAGNOSIS — G4733 Obstructive sleep apnea (adult) (pediatric): Secondary | ICD-10-CM | POA: Diagnosis not present

## 2020-07-16 ENCOUNTER — Other Ambulatory Visit: Payer: Self-pay

## 2020-07-16 ENCOUNTER — Encounter (INDEPENDENT_AMBULATORY_CARE_PROVIDER_SITE_OTHER): Payer: Self-pay | Admitting: Ophthalmology

## 2020-07-16 ENCOUNTER — Ambulatory Visit (INDEPENDENT_AMBULATORY_CARE_PROVIDER_SITE_OTHER): Payer: Medicare PPO | Admitting: Ophthalmology

## 2020-07-16 DIAGNOSIS — H34833 Tributary (branch) retinal vein occlusion, bilateral, with macular edema: Secondary | ICD-10-CM

## 2020-07-16 DIAGNOSIS — H34832 Tributary (branch) retinal vein occlusion, left eye, with macular edema: Secondary | ICD-10-CM | POA: Diagnosis not present

## 2020-07-16 DIAGNOSIS — H34831 Tributary (branch) retinal vein occlusion, right eye, with macular edema: Secondary | ICD-10-CM

## 2020-07-16 MED ORDER — BEVACIZUMAB 2.5 MG/0.1ML IZ SOSY
2.5000 mg | PREFILLED_SYRINGE | INTRAVITREAL | Status: AC | PRN
  Administered 2020-07-16: 2.5 mg via INTRAVITREAL

## 2020-07-16 NOTE — Assessment & Plan Note (Signed)
Center involved CME does persist today at right eye at 5 weeks post Avastin follow-up as scheduled

## 2020-07-16 NOTE — Assessment & Plan Note (Signed)
Improved CME overall OS at 8-week interval in the past yet currently at 11-week interval CME does recur with center involvement.  We will repeat injection Avastin OS today and examination again in 8 weeks left eye

## 2020-07-16 NOTE — Progress Notes (Signed)
07/16/2020     CHIEF COMPLAINT Patient presents for Retina Follow Up (11 Week F/U OS, poss Avastin OS//Pt denies noticeable changes to [facility] OU since last visit. Pt denies ocular pain, flashes of light, or floaters OU. //)   HISTORY OF PRESENT ILLNESS: [name] is a 80 y.o. male who presents to the clinic today for:   HPI    Retina Follow Up    Patient presents with  CRVO/BRVO.  In left eye.  This started 11 weeks ago.  Severity is mild.  Duration of 11 weeks.  Since onset it is stable. Additional comments: 11 Week F/U OS, poss Avastin OS  Pt denies noticeable changes to [facility] OU since last visit. Pt denies ocular pain, flashes of light, or floaters OU.          Last edited by Ileana RoupKennerly, Paige, COA on 07/16/2020  9:23 AM. (History)      Referring physician: Philemon KingdomProchnau, Caroline, MD [address] [place] [id number]  HISTORICAL INFORMATION:   Selected notes from the MEDICAL RECORD NUMBER       CURRENT MEDICATIONS: No current outpatient medications on file. (Ophthalmic Drugs)   No current facility-administered medications for this visit. (Ophthalmic Drugs)   Current Outpatient Medications (Other)  Medication Sig  . aspirin 81 MG tablet Take 81 mg by mouth daily.    . carvedilol (COREG) 12.5 MG tablet Take 1 tablet (12.5 mg total) by mouth 2 (two) times daily. (Patient taking differently: Take 6.25 mg by mouth 2 (two) times daily. )  . Cholecalciferol (VITAMIN D) 1000 UNITS capsule Take 1,000 Units by mouth daily.    . Coenzyme Q10 (COQ10) 100 MG CAPS Take 3 capsules by mouth daily.   . Cyanocobalamin (B-12) 1000 MCG CAPS Take 1 tablet by mouth daily.   Marland Kitchen. levothyroxine (SYNTHROID, LEVOTHROID) 75 MCG tablet Take 1 tablet by mouth daily.  . Misc Natural Products (PROSTATE HEALTH PO) Take 1 tablet by mouth daily.   . Multiple Vitamin (MULTIVITAMIN) tablet Take 1 tablet by mouth daily.  . nitroGLYCERIN (NITROSTAT) 0.4 MG SL tablet Place 1 tablet under the tongue every 5  minutes as needed.  . Omega-3 Fatty Acids (FISH OIL) 1200 MG CAPS Take 1,200 mg by mouth 2 (two) times daily.   . rosuvastatin (CRESTOR) 5 MG tablet Take 1 tablet by mouth daily.  . tamsulosin (FLOMAX) 0.4 MG CAPS capsule Take 0.4 mg by mouth daily.    No current facility-administered medications for this visit. (Other)      REVIEW OF SYSTEMS:    ALLERGIES No Known Allergies  PAST MEDICAL HISTORY Past Medical History:  Diagnosis Date  . Anxiety   . Arthritis    neck; limited neck movement  . Chronic rhinitis   . Coronary artery disease   . Depression    no treatment since he retired  . Dyspnea    with exertion  . GERD (gastroesophageal reflux disease)   . Headache    hx subdural hematoma  . Hyperlipidemia   . Hypertension   . Hypothyroidism   . OSA (obstructive sleep apnea)   . Stroke [facility](HCC) ~1288yrs ago   mild affects Lt eye  . Subdural hematoma (HCC) 2004   Past Surgical History:  Procedure Laterality Date  . APPENDECTOMY  1970s  . BRAIN HEMATOMA EVACUATION  ~2005   subdural hematoma  . CARPAL TUNNEL RELEASE Right 05/21/2016   Procedure: CARPAL TUNNEL RELEASE, right;  Surgeon: Cindee SaltGary Kuzma, MD;  Location:  [facility];  Service: Orthopedics;  Laterality: Right;  FAB  . CATARACT EXTRACTION Left   . CORONARY ARTERY BYPASS GRAFT  ~2006  . Heart bypass  2005  . ROTATOR CUFF REPAIR Right ~4 years    FAMILY HISTORY Family History  Problem Relation Age of Onset  . Heart disease Father   . Leukemia Son   . Stroke Mother     SOCIAL HISTORY Social History   Tobacco Use  . Smoking status: Never Smoker  . Smokeless tobacco: Never Used  Vaping Use  . Vaping Use: Never used  Substance Use Topics  . Alcohol use: No  . Drug use: No         OPHTHALMIC EXAM: Base Eye Exam    Visual Acuity (ETDRS)      Right Left   Dist [place] 20/20 20/20       Tonometry (Tonopen, 9:23 AM)      Right Left   Pressure 21 13       Pupils      Pupils Dark Light  Shape React APD   Right PERRL 3 2 Round Slow None   Left PERRL 3 2 Round Slow None       Visual Fields (Counting fingers)      Left Right    Full Full       Extraocular Movement      Right Left    Full Full       Neuro/Psych    Oriented x3: Yes   Mood/Affect: Normal       Dilation    Left eye: 1.0% Mydriacyl, 2.5% Phenylephrine @ 9:27 AM        Slit Lamp and Fundus Exam    External Exam      Right Left   External Normal Normal       Slit Lamp Exam      Right Left   Lids/Lashes Normal Normal   Conjunctiva/Sclera White and quiet White and quiet   Cornea Clear Clear   Anterior Chamber Deep and quiet Deep and quiet   Iris Round and reactive Round and reactive   Lens Posterior chamber intraocular lens Posterior chamber intraocular lens   Anterior Vitreous Normal Normal       Fundus Exam      Right Left   Posterior Vitreous  Posterior vitreous detachment   Disc  Pallor 1+   C/D Ratio  0.6   Macula  Microaneurysms along the inferotemporal border of the FAZ, Macular thickening, Hard drusen, no exudates, no atrophy, Cystoid macular edema   Vessels  Old macular branch retinal vein occlusion   Periphery  Normal          IMAGING AND PROCEDURES  Imaging and Procedures for 07/16/20  OCT, Retina - OU - Both Eyes       Right Eye Quality was good. Scan locations included subfoveal. Central Foveal Thickness: 412. Progression has been stable. Findings include abnormal foveal contour, cystoid macular edema.   Left Eye Quality was good. Scan locations included subfoveal. Central Foveal Thickness: 385. Progression has improved. Findings include abnormal foveal contour.   Notes CME OD, juxta foveal from macular twig branch retinal vein occlusion. Stable overall currently at 6-week follow-up interval  OS slightly worse at current interval postmost  recent injection Avastin, 11 weeks previous OS        Intravitreal Injection, Pharmacologic Agent - OS - Left Eye        Time Out 07/16/2020.  9:47 AM. Confirmed correct patient, procedure, site, and patient consented.   Anesthesia Topical anesthesia was used.   Procedure Preparation included Ofloxacin , 10% betadine to eyelids, 5% betadine to ocular surface. A 30 gauge needle was used.   Injection:  2.5 mg Bevacizumab (AVASTIN) 2.5mg /0.44mL SOSY   NDC: 37902-409-73, Lot: [id number]   Route: Intravitreal, Site: Left Eye  Post-op Post injection exam found visual acuity of at least counting fingers. The patient tolerated the procedure well. There were no complications. The patient received written and verbal post procedure care education. Post injection medications were not given.   Notes Leiter's 0.1 cc                ASSESSMENT/PLAN:  Branch retinal vein occlusion with macular edema of left eye Improved CME overall OS at 8-week interval in the past yet currently at 11-week interval CME does recur with center involvement.  We will repeat injection Avastin OS today and examination again in 8 weeks left eye  Branch retinal vein occlusion with macular edema of right eye Center involved CME does persist today at right eye at 5 weeks post Avastin follow-up as scheduled      ICD-10-CM   1. Branch retinal vein occlusion with macular edema of left eye  H34.8320 OCT, Retina - OU - Both Eyes    Intravitreal Injection, Pharmacologic Agent - OS - Left Eye    bevacizumab (AVASTIN) SOSY 2.5 mg  2. Branch retinal vein occlusion with macular edema of right eye  H34.8310     1.  Retinal vein occlusion with center involved CME, has recurred at 11-week interval.  Previously at 8-week interval CME had remained resolved OS, repeat injection Avastin OS today and examination again left eye in 8 weeks  2.  CME OD from center involved BRVO has recurred now at 5 weeks, follow-up dilate OD next as scheduled  3.  Ophthalmic Meds Ordered this visit:  Meds ordered this encounter  Medications  . bevacizumab  (AVASTIN) SOSY 2.5 mg       Return in about 8 weeks (around 09/10/2020) for dilate, OS, AVASTIN OCT.  There are no Patient Instructions on file for this visit.   Explained the diagnoses, plan, and follow up with the patient and they expressed understanding.  Patient expressed understanding of the importance of proper follow up care.   Alford Highland Trai Ells M.D. Diseases & Surgery of the Retina and Vitreous [facility] 07/16/20     Abbreviations: M myopia (nearsighted); A astigmatism; H hyperopia (farsighted); P presbyopia; Mrx spectacle prescription;  CTL contact lenses; OD right eye; OS left eye; OU both eyes  XT exotropia; ET esotropia; PEK punctate epithelial keratitis; PEE punctate epithelial erosions; DES dry eye syndrome; MGD meibomian gland dysfunction; ATs artificial tears; PFAT's preservative free artificial tears; NSC nuclear sclerotic cataract; PSC posterior subcapsular cataract; ERM epi-retinal membrane; PVD posterior vitreous detachment; RD retinal detachment; DM diabetes mellitus; DR diabetic retinopathy; NPDR non-proliferative diabetic retinopathy; PDR proliferative diabetic retinopathy; CSME clinically significant macular edema; DME diabetic macular edema; dbh dot blot hemorrhages; CWS cotton wool spot; POAG primary open angle glaucoma; C/D cup-to-disc ratio; HVF humphrey visual field; GVF goldmann visual field; OCT optical coherence tomography; IOP intraocular pressure; BRVO Branch retinal vein occlusion; CRVO central retinal vein occlusion; CRAO central retinal artery occlusion; BRAO branch retinal artery occlusion; RT retinal tear; SB scleral buckle; PPV pars plana vitrectomy; VH Vitreous hemorrhage; PRP panretinal laser photocoagulation; IVK intravitreal kenalog; VMT vitreomacular traction; MH Macular  hole;  NVD neovascularization of the disc; NVE neovascularization elsewhere; AREDS age related eye disease study; ARMD age related macular degeneration; POAG primary  open angle glaucoma; EBMD epithelial/anterior basement membrane dystrophy; ACIOL anterior chamber intraocular lens; IOL intraocular lens; PCIOL posterior chamber intraocular lens; Phaco/IOL phacoemulsification with intraocular lens placement; PRK photorefractive keratectomy; LASIK laser assisted in situ keratomileusis; HTN hypertension; DM diabetes mellitus; COPD chronic obstructive pulmonary disease

## 2020-07-20 DIAGNOSIS — G4733 Obstructive sleep apnea (adult) (pediatric): Secondary | ICD-10-CM | POA: Diagnosis not present

## 2020-07-23 ENCOUNTER — Other Ambulatory Visit: Payer: Self-pay

## 2020-07-23 ENCOUNTER — Ambulatory Visit (INDEPENDENT_AMBULATORY_CARE_PROVIDER_SITE_OTHER): Payer: Medicare PPO | Admitting: Ophthalmology

## 2020-07-23 ENCOUNTER — Encounter (INDEPENDENT_AMBULATORY_CARE_PROVIDER_SITE_OTHER): Payer: Self-pay | Admitting: Ophthalmology

## 2020-07-23 DIAGNOSIS — H34831 Tributary (branch) retinal vein occlusion, right eye, with macular edema: Secondary | ICD-10-CM | POA: Diagnosis not present

## 2020-07-23 DIAGNOSIS — H34832 Tributary (branch) retinal vein occlusion, left eye, with macular edema: Secondary | ICD-10-CM

## 2020-07-23 MED ORDER — BEVACIZUMAB 2.5 MG/0.1ML IZ SOSY
2.5000 mg | PREFILLED_SYRINGE | INTRAVITREAL | Status: AC | PRN
  Administered 2020-07-23: 2.5 mg via INTRAVITREAL

## 2020-07-23 NOTE — Assessment & Plan Note (Signed)
OS, improved 1 week post injection Avastin

## 2020-07-23 NOTE — Progress Notes (Signed)
07/23/2020     CHIEF COMPLAINT Patient presents for Retina Follow Up (6 WK FU OD, POSS AVASTIN OD///Pt report stable vision OD. Pt denies any new F/F, pain, or pressure OD)   HISTORY OF PRESENT ILLNESS: [name] is a 80 y.o. male who presents to the clinic today for:   HPI    Retina Follow Up    Patient presents with  CRVO/BRVO.  In right eye.  This started 6 weeks ago.  Duration of 6 weeks.  Since onset it is stable. Additional comments: 6 WK FU OD, POSS AVASTIN OD   Pt report stable vision OD. Pt denies any new F/F, pain, or pressure OD       Last edited by Varney Biles D on 07/23/2020  9:32 AM. (History)      Referring physician: [name], MD [address] [place],  [place] [address]  HISTORICAL INFORMATION:   Selected notes from the MEDICAL RECORD NUMBER       CURRENT MEDICATIONS: No current outpatient medications on file. (Ophthalmic Drugs)   No current facility-administered medications for this visit. (Ophthalmic Drugs)   Current Outpatient Medications (Other)  Medication Sig  . aspirin 81 MG tablet Take 81 mg by mouth daily.    . carvedilol (COREG) 12.5 MG tablet Take 1 tablet (12.5 mg total) by mouth 2 (two) times daily. (Patient taking differently: Take 6.25 mg by mouth 2 (two) times daily. )  . Cholecalciferol (VITAMIN D) 1000 UNITS capsule Take 1,000 Units by mouth daily.    . Coenzyme Q10 (COQ10) 100 MG CAPS Take 3 capsules by mouth daily.   . Cyanocobalamin (B-12) 1000 MCG CAPS Take 1 tablet by mouth daily.   Marland Kitchen levothyroxine (SYNTHROID, LEVOTHROID) 75 MCG tablet Take 1 tablet by mouth daily.  . Misc Natural Products (PROSTATE HEALTH PO) Take 1 tablet by mouth daily.   . Multiple Vitamin (MULTIVITAMIN) tablet Take 1 tablet by mouth daily.  . nitroGLYCERIN (NITROSTAT) 0.4 MG SL tablet Place 1 tablet under the tongue every 5 minutes as needed.  . Omega-3 Fatty Acids (FISH OIL) 1200 MG CAPS Take 1,200 mg by mouth 2 (two) times daily.   .  rosuvastatin (CRESTOR) 5 MG tablet Take 1 tablet by mouth daily.  . tamsulosin (FLOMAX) 0.4 MG CAPS capsule Take 0.4 mg by mouth daily.    No current facility-administered medications for this visit. (Other)      REVIEW OF SYSTEMS:    ALLERGIES No Known Allergies  PAST MEDICAL HISTORY Past Medical History:  Diagnosis Date  . Anxiety   . Arthritis    neck; limited neck movement  . Chronic rhinitis   . Coronary artery disease   . Depression    no treatment since he retired  . Dyspnea    with exertion  . GERD (gastroesophageal reflux disease)   . Headache    hx subdural hematoma  . Hyperlipidemia   . Hypertension   . Hypothyroidism   . OSA (obstructive sleep apnea)   . Stroke [facility]) ~40yrs ago   mild affects Lt eye  . Subdural hematoma (HCC) 2004   Past Surgical History:  Procedure Laterality Date  . APPENDECTOMY  1970s  . BRAIN HEMATOMA EVACUATION  ~2005   subdural hematoma  . CARPAL TUNNEL RELEASE Right 05/21/2016   Procedure: CARPAL TUNNEL RELEASE, right;  Surgeon: Cindee Salt, MD;  Location: [facility];  Service: Orthopedics;  Laterality: Right;  FAB  . CATARACT EXTRACTION Left   .  CORONARY ARTERY BYPASS GRAFT  ~2006  . Heart bypass  2005  . ROTATOR CUFF REPAIR Right ~4 years    FAMILY HISTORY Family History  Problem Relation Age of Onset  . Heart disease Father   . Leukemia Son   . Stroke Mother     SOCIAL HISTORY Social History   Tobacco Use  . Smoking status: Never Smoker  . Smokeless tobacco: Never Used  Vaping Use  . Vaping Use: Never used  Substance Use Topics  . Alcohol use: No  . Drug use: No         OPHTHALMIC EXAM:  Base Eye Exam    Visual Acuity (ETDRS)      Right Left   Dist [place] 20/20 20/20       Tonometry (Tonopen, 9:35 AM)      Right Left   Pressure 11 12       Pupils      Pupils Dark Light Shape React APD   Right PERRL 3 2 Round Slow None   Left PERRL 3 2 Round Slow None       Visual Fields  (Counting fingers)      Left Right    Full Full       Extraocular Movement      Right Left    Full Full       Neuro/Psych    Oriented x3: Yes   Mood/Affect: Normal       Dilation    Right eye: 1.0% Mydriacyl, 2.5% Phenylephrine @ 9:35 AM        Slit Lamp and Fundus Exam    External Exam      Right Left   External Normal Normal       Slit Lamp Exam      Right Left   Lids/Lashes Normal Normal   Conjunctiva/Sclera White and quiet White and quiet   Cornea Clear Clear   Anterior Chamber Deep and quiet Deep and quiet   Iris Round and reactive Round and reactive   Lens Posterior chamber intraocular lens Posterior chamber intraocular lens   Anterior Vitreous Normal Normal       Fundus Exam      Right Left   Posterior Vitreous Posterior vitreous detachment    Disc Normal    C/D Ratio 0.35    Macula Cystoid macular edema, Macular thickening, no exudates, Microaneurysms    Vessels Twig macular branch retinal vein occlusion.    Periphery Normal           IMAGING AND PROCEDURES  Imaging and Procedures for 07/23/20  OCT, Retina - OU - Both Eyes       Right Eye Quality was good. Scan locations included subfoveal. Central Foveal Thickness: 417. Progression has been stable. Findings include abnormal foveal contour, cystoid macular edema.   Left Eye Quality was good. Scan locations included subfoveal. Central Foveal Thickness: 300. Progression has improved. Findings include abnormal foveal contour.   Notes CME OD, juxta foveal from macular twig branch retinal vein occlusion. Stable overall currently at 6-week follow-up interval, repeat injection Avastin now  OS improved OS, 1 week post injection Avastin        Intravitreal Injection, Pharmacologic Agent - OD - Right Eye       Time Out 07/23/2020. 10:18 AM. Confirmed correct patient, procedure, site, and patient consented.   Anesthesia Topical anesthesia was used. Anesthetic medications included Akten 3.5%.    Procedure Preparation included Tobramycin 0.3%, 10% betadine to eyelids, 5%  betadine to ocular surface, Ofloxacin . A 30 gauge needle was used.   Injection:  2.5 mg Bevacizumab (AVASTIN) 2.5mg /0.37mL SOSY   NDC: 56387-564-33, Lot: [id number]   Route: Intravitreal, Site: Right Eye  Post-op Post injection exam found visual acuity of at least counting fingers. The patient tolerated the procedure well. There were no complications. The patient received written and verbal post procedure care education. Post injection medications were not given.                 ASSESSMENT/PLAN:  Branch retinal vein occlusion with macular edema of right eye OD, overall improved yet still with recurrence of 6-week, will need repeat injection Avastin today  Branch retinal vein occlusion with macular edema of left eye OS, improved 1 week post injection Avastin      ICD-10-CM   1. Branch retinal vein occlusion with macular edema of right eye  H34.8310 OCT, Retina - OU - Both Eyes    Intravitreal Injection, Pharmacologic Agent - OD - Right Eye    bevacizumab (AVASTIN) SOSY 2.5 mg  2. Branch retinal vein occlusion with macular edema of left eye  H34.8320     1.  OS much improved 1 week post intravitreal Avastin for twig branch retinal vein occlusion with significant CME, follow-up as scheduled and dilate OS  2.  OD today 6 weeks post injection Avastin, with recurrence again of CME at this interval.  We will repeat injection today to maintain visual functioning  3.  Ophthalmic Meds Ordered this visit:  Meds ordered this encounter  Medications  . bevacizumab (AVASTIN) SOSY 2.5 mg       Return in about 6 weeks (around 09/03/2020) for dilate, OD, AVASTIN OCT.  There are no Patient Instructions on file for this visit.   Explained the diagnoses, plan, and follow up with the patient and they expressed understanding.  Patient expressed understanding of the importance of proper follow up care.   Alford Highland Rankin M.D. Diseases & Surgery of the Retina and Vitreous [facility] 07/23/20     Abbreviations: M myopia (nearsighted); A astigmatism; H hyperopia (farsighted); P presbyopia; Mrx spectacle prescription;  CTL contact lenses; OD right eye; OS left eye; OU both eyes  XT exotropia; ET esotropia; PEK punctate epithelial keratitis; PEE punctate epithelial erosions; DES dry eye syndrome; MGD meibomian gland dysfunction; ATs artificial tears; PFAT's preservative free artificial tears; NSC nuclear sclerotic cataract; PSC posterior subcapsular cataract; ERM epi-retinal membrane; PVD posterior vitreous detachment; RD retinal detachment; DM diabetes mellitus; DR diabetic retinopathy; NPDR non-proliferative diabetic retinopathy; PDR proliferative diabetic retinopathy; CSME clinically significant macular edema; DME diabetic macular edema; dbh dot blot hemorrhages; CWS cotton wool spot; POAG primary open angle glaucoma; C/D cup-to-disc ratio; HVF humphrey visual field; GVF goldmann visual field; OCT optical coherence tomography; IOP intraocular pressure; BRVO Branch retinal vein occlusion; CRVO central retinal vein occlusion; CRAO central retinal artery occlusion; BRAO branch retinal artery occlusion; RT retinal tear; SB scleral buckle; PPV pars plana vitrectomy; VH Vitreous hemorrhage; PRP panretinal laser photocoagulation; IVK intravitreal kenalog; VMT vitreomacular traction; MH Macular hole;  NVD neovascularization of the disc; NVE neovascularization elsewhere; AREDS age related eye disease study; ARMD age related macular degeneration; POAG primary open angle glaucoma; EBMD epithelial/anterior basement membrane dystrophy; ACIOL anterior chamber intraocular lens; IOL intraocular lens; PCIOL posterior chamber intraocular lens; Phaco/IOL phacoemulsification with intraocular lens placement; PRK photorefractive keratectomy; LASIK laser assisted in situ keratomileusis; HTN hypertension; DM diabetes  mellitus; COPD chronic obstructive pulmonary  disease

## 2020-07-23 NOTE — Assessment & Plan Note (Signed)
OD, overall improved yet still with recurrence of 6-week, will need repeat injection Avastin today

## 2020-08-17 DIAGNOSIS — G4733 Obstructive sleep apnea (adult) (pediatric): Secondary | ICD-10-CM | POA: Diagnosis not present

## 2020-08-20 ENCOUNTER — Other Ambulatory Visit: Payer: Self-pay

## 2020-08-20 DIAGNOSIS — R0600 Dyspnea, unspecified: Secondary | ICD-10-CM | POA: Insufficient documentation

## 2020-08-20 DIAGNOSIS — F32A Depression, unspecified: Secondary | ICD-10-CM | POA: Insufficient documentation

## 2020-08-20 DIAGNOSIS — M1990 Unspecified osteoarthritis, unspecified site: Secondary | ICD-10-CM | POA: Insufficient documentation

## 2020-08-20 DIAGNOSIS — K219 Gastro-esophageal reflux disease without esophagitis: Secondary | ICD-10-CM | POA: Insufficient documentation

## 2020-08-20 DIAGNOSIS — I2510 Atherosclerotic heart disease of native coronary artery without angina pectoris: Secondary | ICD-10-CM | POA: Insufficient documentation

## 2020-08-20 DIAGNOSIS — E785 Hyperlipidemia, unspecified: Secondary | ICD-10-CM | POA: Insufficient documentation

## 2020-08-20 DIAGNOSIS — G4733 Obstructive sleep apnea (adult) (pediatric): Secondary | ICD-10-CM | POA: Insufficient documentation

## 2020-08-20 DIAGNOSIS — F419 Anxiety disorder, unspecified: Secondary | ICD-10-CM | POA: Insufficient documentation

## 2020-08-20 DIAGNOSIS — I639 Cerebral infarction, unspecified: Secondary | ICD-10-CM | POA: Insufficient documentation

## 2020-08-20 DIAGNOSIS — I10 Essential (primary) hypertension: Secondary | ICD-10-CM | POA: Insufficient documentation

## 2020-08-20 DIAGNOSIS — E039 Hypothyroidism, unspecified: Secondary | ICD-10-CM | POA: Insufficient documentation

## 2020-08-20 DIAGNOSIS — R519 Headache, unspecified: Secondary | ICD-10-CM | POA: Insufficient documentation

## 2020-08-21 ENCOUNTER — Encounter: Payer: Self-pay | Admitting: Cardiology

## 2020-08-21 ENCOUNTER — Ambulatory Visit: Payer: Medicare PPO | Admitting: Cardiology

## 2020-08-21 ENCOUNTER — Other Ambulatory Visit: Payer: Self-pay

## 2020-08-21 VITALS — BP 126/78 | HR 71 | Ht 70.0 in | Wt 191.0 lb

## 2020-08-21 DIAGNOSIS — Z951 Presence of aortocoronary bypass graft: Secondary | ICD-10-CM

## 2020-08-21 DIAGNOSIS — I10 Essential (primary) hypertension: Secondary | ICD-10-CM | POA: Diagnosis not present

## 2020-08-21 DIAGNOSIS — I255 Ischemic cardiomyopathy: Secondary | ICD-10-CM

## 2020-08-21 DIAGNOSIS — I2510 Atherosclerotic heart disease of native coronary artery without angina pectoris: Secondary | ICD-10-CM | POA: Diagnosis not present

## 2020-08-21 DIAGNOSIS — E785 Hyperlipidemia, unspecified: Secondary | ICD-10-CM

## 2020-08-21 NOTE — Patient Instructions (Signed)

## 2020-08-21 NOTE — Progress Notes (Signed)
Cardiology Office Note:    Date:  08/21/2020   ID:  [name], DOB [birth year], MRN [id number]  PCP:  [name], MD  Cardiologist:  [name], MD    Referring MD: [name], MD   Chief Complaint  Patient presents with  . Results  I am doing fine  History of Present Illness:    [name] is a 80 y.o. male with past medical history significant for coronary artery disease, status post coronary vascular done years ago, stress test done just recently showing no evidence of ischemia, he does have essential hypertension, dyslipidemia.  Comes today 2 months of follow-up.  Overall doing well denies have any chest pain tightness squeezing pressure burning chest.  He works in the garden with no difficulties.  Past Medical History:  Diagnosis Date  . Anxiety   . ANXIETY 07/28/2010   Qualifier: Diagnosis of  By: [name] LPN, [name]    . Arthritis    neck; limited neck movement  . Branch retinal vein occlusion with macular edema of left eye 09/05/2019  . Branch retinal vein occlusion with macular edema of right eye 09/05/2019  . Chronic cholecystitis 05/13/2020  . Chronic rhinitis   . Coronary artery disease   . Coronary artery disease involving native coronary artery of native heart 05/11/2016  . Depression    no treatment since he retired  . DEPRESSION 07/28/2010   Qualifier: Diagnosis of  By: [name] LPN, [name]    . Dyspnea    with exertion  . Essential hypertension 07/28/2010   Qualifier: Diagnosis of  By: [name] LPN, [name]    . Essential tremor 09/07/2017  . Gallbladder sludge 05/13/2020  . GERD (gastroesophageal reflux disease)   . Headache    hx subdural hematoma  . Hyperlipidemia   . HYPERLIPIDEMIA 07/28/2010   Qualifier: Diagnosis of  By: [name] LPN, [name]    . Hypertension   . Hypothyroidism   . Intermediate stage nonexudative age-related macular degeneration of right eye 09/05/2019  . Ischemic cardiomyopathy 05/11/2016  . OBSTRUCTIVE SLEEP  APNEA 07/29/2010   NPSG 2006:  AHI 18/hr. Pressure optimized to 12cm by auto device 2012    . OSA (obstructive sleep apnea)   . Posterior vitreous detachment of left eye 09/05/2019  . Posterior vitreous detachment of right eye 09/05/2019  . Postoperative examination 06/10/2020  . RESTLESS LEGS SYNDROME 07/29/2010   Trial of requip 2012:  +intolerance, didn't feel it made a difference   . Status post coronary artery bypass graft 05/11/2016   Overview:  2005  Formatting of this note might be different from the original. 2005  . Stroke [facility]) ~25yrs ago   mild affects Lt eye  . Subdural hematoma (HCC) 2004    Past Surgical History:  Procedure Laterality Date  . APPENDECTOMY  1970s  . BRAIN HEMATOMA EVACUATION  ~2005   subdural hematoma  . CARPAL TUNNEL RELEASE Right 05/21/2016   Procedure: CARPAL TUNNEL RELEASE, right;  Surgeon: Cindee Salt, MD;  Location: [facility];  Service: Orthopedics;  Laterality: Right;  FAB  . CATARACT EXTRACTION Left   . CORONARY ARTERY BYPASS GRAFT  ~2006  . gall bladder removed   05/2020  . Heart bypass  2005  . ROTATOR CUFF REPAIR Right ~4 years    Current Medications: Current Meds  Medication Sig  . aspirin 81 MG tablet Take 81 mg by mouth daily.  . carvedilol (COREG) 12.5 MG tablet Take 1 tablet (12.5 mg total) by mouth  2 (two) times daily. (Patient taking differently: Take 6.25 mg by mouth 2 (two) times daily.)  . Cholecalciferol (VITAMIN D) 1000 UNITS capsule Take 1,000 Units by mouth daily.  . Coenzyme Q10 (COQ10) 100 MG CAPS Take 3 capsules by mouth daily.   . Cyanocobalamin (B-12) 1000 MCG CAPS Take 1 tablet by mouth daily.   Marland Kitchen levothyroxine (SYNTHROID, LEVOTHROID) 75 MCG tablet Take 1 tablet by mouth daily.  . nitroGLYCERIN (NITROSTAT) 0.4 MG SL tablet Place 1 tablet under the tongue every 5 minutes as needed. (Patient taking differently: Place 0.4 mg under the tongue every 5 (five) minutes as needed for chest pain.)  . Omega-3 Fatty  Acids (FISH OIL) 1200 MG CAPS Take 1,200 mg by mouth 2 (two) times daily.   . rosuvastatin (CRESTOR) 5 MG tablet Take 1 tablet by mouth daily.  . tamsulosin (FLOMAX) 0.4 MG CAPS capsule Take 0.4 mg by mouth daily.      Allergies:   Patient has no known allergies.   Social History   Socioeconomic History  . Marital status: Married    Spouse name: [name]  . Number of children: 1  . Years of education: Not on file  . Highest education level: Not on file  Occupational History    Comment: [name]    Comment: [employer]  Tobacco Use  . Smoking status: Never Smoker  . Smokeless tobacco: Never Used  Vaping Use  . Vaping Use: Never used  Substance and Sexual Activity  . Alcohol use: No  . Drug use: No  . Sexual activity: Not on file  Other Topics Concern  . 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 ROS:   Please see the history of present illness.    All 14 point review of systems negative except as described per history of present illness  EKGs/Labs/Other Studies Reviewed:      Recent Labs: No results found for requested labs within last 8760 hours.  Recent Lipid Panel No results found for: CHOL, TRIG, HDL, CHOLHDL, VLDL, LDLCALC, LDLDIRECT  Physical Exam:    VS:  BP 126/78 (BP Location: Right Arm)   Pulse 71   Ht 5\' 10"  (1.778 m)   Wt 191 lb (86.6 kg)   SpO2 97%   BMI 27.41 kg/m     Wt Readings from Last 3 Encounters:  08/21/20 191 lb (86.6 kg)  05/16/20 192 lb (87.1 kg)  02/22/20 192 lb (87.1 kg)     GEN:  Well nourished,  well developed in no acute distress HEENT: Normal NECK: No JVD; No carotid bruits LYMPHATICS: No lymphadenopathy CARDIAC: RRR, no murmurs, no rubs, no gallops RESPIRATORY:  Clear to auscultation without rales, wheezing or rhonchi  ABDOMEN: Soft, non-tender, non-distended MUSCULOSKELETAL:  No edema; No deformity  SKIN: Warm and dry LOWER EXTREMITIES: no swelling NEUROLOGIC:  Alert and oriented x 3 PSYCHIATRIC:  Normal affect   ASSESSMENT:    1. Coronary artery disease involving native coronary artery of native heart without angina pectoris   2.  Essential hypertension   3. Ischemic cardiomyopathy   4. Dyslipidemia (high LDL; low HDL)   5. Status post coronary artery bypass graft    PLAN:    In order of problems listed above:  1. Coronary artery disease stable from that point review I did review stress test with him which showed no evidence of ischemia.  We will continue present management. 2. Essential hypertension: Blood pressure well controlled continue present management. 3. Dyslipidemia I did review his K PN which show me data from August 2021 with LDL of 65 HDL 42.  I will schedule him to have fasting lipid profile done.  In the meantime we will continue his medications which include Crestor 5 mg daily. 4. Ischemic cardiomyopathy I did review echocardiogram with the patient last echocardiogram showed preserved left ventricle ejection fraction. 5. EKG has been done today which showed normal sinus rhythm, normal P interval, normal QS complex duration morphology no ST segment changes.  Overall he is doing very well we will continue present management.   Medication Adjustments/Labs and Tests Ordered: Current medicines are reviewed at length with the patient today.  Concerns regarding medicines are outlined above.  Orders Placed This Encounter  Procedures  . EKG 12-Lead   Medication changes: No orders of the defined types were placed in this encounter.   Signed, Georgeanna Lea, MD, [facility] 08/21/2020 12:22 PM    [facility]

## 2020-09-03 ENCOUNTER — Ambulatory Visit (INDEPENDENT_AMBULATORY_CARE_PROVIDER_SITE_OTHER): Payer: Medicare PPO | Admitting: Ophthalmology

## 2020-09-03 ENCOUNTER — Other Ambulatory Visit: Payer: Self-pay

## 2020-09-03 ENCOUNTER — Encounter (INDEPENDENT_AMBULATORY_CARE_PROVIDER_SITE_OTHER): Payer: Self-pay | Admitting: Ophthalmology

## 2020-09-03 DIAGNOSIS — H34831 Tributary (branch) retinal vein occlusion, right eye, with macular edema: Secondary | ICD-10-CM | POA: Diagnosis not present

## 2020-09-03 MED ORDER — BEVACIZUMAB 2.5 MG/0.1ML IZ SOSY
2.5000 mg | PREFILLED_SYRINGE | INTRAVITREAL | Status: AC | PRN
  Administered 2020-09-03: 2.5 mg via INTRAVITREAL

## 2020-09-03 NOTE — Progress Notes (Signed)
09/03/2020     CHIEF COMPLAINT Patient presents for Retina Follow Up (6 Wk F/U OD, poss Avastin OD//Pt denies noticeable changes to [place] OU since last visit. Pt denies ocular pain, flashes of light, or floaters OU. //)   HISTORY OF PRESENT ILLNESS: [name] is a 80 y.o. male who presents to the clinic today for:   HPI    Retina Follow Up    Patient presents with  CRVO/BRVO.  In right eye.  This started 6 weeks ago.  Severity is mild.  Duration of 6 weeks.  Since onset it is stable. Additional comments: 6 Wk F/U OD, poss Avastin OD  Pt denies noticeable changes to [place] OU since last visit. Pt denies ocular pain, flashes of light, or floaters OU.          Last edited by Ileana Roup, COA on 09/03/2020  9:11 AM. (History)      Referring physician: [name], MD [address] [place],  [place] [address]  HISTORICAL INFORMATION:   Selected notes from the MEDICAL RECORD NUMBER       CURRENT MEDICATIONS: No current outpatient medications on file. (Ophthalmic Drugs)   No current facility-administered medications for this visit. (Ophthalmic Drugs)   Current Outpatient Medications (Other)  Medication Sig  . aspirin 81 MG tablet Take 81 mg by mouth daily.  . carvedilol (COREG) 12.5 MG tablet Take 1 tablet (12.5 mg total) by mouth 2 (two) times daily.  . Cholecalciferol (VITAMIN D) 1000 UNITS capsule Take 1,000 Units by mouth daily.  . Coenzyme Q10 (COQ10) 100 MG CAPS Take 3 capsules by mouth daily.   . Cyanocobalamin (B-12) 1000 MCG CAPS Take 1 tablet by mouth daily.   Marland Kitchen levothyroxine (SYNTHROID, LEVOTHROID) 75 MCG tablet Take 1 tablet by mouth daily.  . Misc Natural Products (PROSTATE HEALTH PO) Take 1 tablet by mouth daily. Unknown strength per patient  . Multiple Vitamin (MULTIVITAMIN) tablet Take 2 tablets by mouth daily.  . nitroGLYCERIN (NITROSTAT) 0.4 MG SL tablet Place 1 tablet under the tongue every 5 minutes as needed. (Patient taking differently: Place 0.4 mg  under the tongue every 5 (five) minutes as needed for chest pain.)  . Omega-3 Fatty Acids (FISH OIL) 1200 MG CAPS Take 1,200 mg by mouth 2 (two) times daily.   . rosuvastatin (CRESTOR) 5 MG tablet Take 1 tablet by mouth daily.  . tamsulosin (FLOMAX) 0.4 MG CAPS capsule Take 0.4 mg by mouth daily.    No current facility-administered medications for this visit. (Other)      REVIEW OF SYSTEMS:    ALLERGIES No Known Allergies  PAST MEDICAL HISTORY Past Medical History:  Diagnosis Date  . Anxiety   . ANXIETY 07/28/2010   Qualifier: Diagnosis of  By: [name] LPN, [name]    . Arthritis    neck; limited neck movement  . Branch retinal vein occlusion with macular edema of left eye 09/05/2019  . Branch retinal vein occlusion with macular edema of right eye 09/05/2019  . Chronic cholecystitis 05/13/2020  . Chronic rhinitis   . Coronary artery disease   . Coronary artery disease involving native coronary artery of native heart 05/11/2016  . Depression    no treatment since he retired  . DEPRESSION 07/28/2010   Qualifier: Diagnosis of  By: [name] LPN, [name]    . Dyspnea    with exertion  . Essential hypertension 07/28/2010   Qualifier: Diagnosis of  By: [name] LPN, [name]    .  Essential tremor 09/07/2017  . Gallbladder sludge 05/13/2020  . GERD (gastroesophageal reflux disease)   . Headache    hx subdural hematoma  . Hyperlipidemia   . HYPERLIPIDEMIA 07/28/2010   Qualifier: Diagnosis of  By: [name] LPN, [name]    . Hypertension   . Hypothyroidism   . Intermediate stage nonexudative age-related macular degeneration of right eye 09/05/2019  . Ischemic cardiomyopathy 05/11/2016  . OBSTRUCTIVE SLEEP APNEA 07/29/2010   NPSG 2006:  AHI 18/hr. Pressure optimized to 12cm by auto device 2012    . OSA (obstructive sleep apnea)   . Posterior vitreous detachment of left eye 09/05/2019  . Posterior vitreous detachment of right eye 09/05/2019  . Postoperative examination 06/10/2020  . RESTLESS LEGS  SYNDROME 07/29/2010   Trial of requip 2012:  +intolerance, didn't feel it made a difference   . Status post coronary artery bypass graft 05/11/2016   Overview:  2005  Formatting of this note might be different from the original. 2005  . Stroke [facility]) ~82yrs ago   mild affects Lt eye  . Subdural hematoma (HCC) 2004   Past Surgical History:  Procedure Laterality Date  . APPENDECTOMY  1970s  . BRAIN HEMATOMA EVACUATION  ~2005   subdural hematoma  . CARPAL TUNNEL RELEASE Right 05/21/2016   Procedure: CARPAL TUNNEL RELEASE, right;  Surgeon: Cindee Salt, MD;  Location: [facility];  Service: Orthopedics;  Laterality: Right;  FAB  . CATARACT EXTRACTION Left   . CORONARY ARTERY BYPASS GRAFT  ~2006  . gall bladder removed   05/2020  . Heart bypass  2005  . ROTATOR CUFF REPAIR Right ~4 years    FAMILY HISTORY Family History  Problem Relation Age of Onset  . Heart disease Father   . Leukemia Son   . Stroke Mother     SOCIAL HISTORY Social History   Tobacco Use  . Smoking status: Never Smoker  . Smokeless tobacco: Never Used  Vaping Use  . Vaping Use: Never used  Substance Use Topics  . Alcohol use: No  . Drug use: No         OPHTHALMIC EXAM: Base Eye Exam    Visual Acuity (ETDRS)      Right Left   Dist [place] 20/20 20/20 -1       Tonometry (Tonopen, 9:11 AM)      Right Left   Pressure 09 14       Pupils      Pupils Dark Light Shape React APD   Right PERRL 3 3 Round Minimal None   Left PERRL 3 3 Round Minimal None       Visual Fields (Counting fingers)      Left Right    Full Full       Extraocular Movement      Right Left    Full Full       Neuro/Psych    Oriented x3: Yes   Mood/Affect: Normal       Dilation    Right eye: 1.0% Mydriacyl, 2.5% Phenylephrine @ 9:14 AM        Slit Lamp and Fundus Exam    External Exam      Right Left   External Normal Normal       Slit Lamp Exam      Right Left   Lids/Lashes Normal Normal    Conjunctiva/Sclera White and quiet White and quiet   Cornea Clear Clear   Anterior Chamber Deep and quiet Deep and  quiet   Iris Round and reactive Round and reactive   Lens Posterior chamber intraocular lens Posterior chamber intraocular lens   Anterior Vitreous Normal Normal       Fundus Exam      Right Left   Posterior Vitreous Posterior vitreous detachment    Disc Normal    C/D Ratio 0.35    Macula Cystoid macular edema, Macular thickening, no exudates, Microaneurysms    Vessels Twig macular branch retinal vein occlusion.    Periphery Normal           IMAGING AND PROCEDURES  Imaging and Procedures for 09/03/20  OCT, Retina - OU - Both Eyes       Right Eye Quality was good. Scan locations included subfoveal. Central Foveal Thickness: 409. Progression has improved. Findings include abnormal foveal contour, cystoid macular edema.   Left Eye Quality was good. Scan locations included subfoveal. Central Foveal Thickness: 309. Progression has improved. Findings include abnormal foveal contour.   Notes CME OD, juxta foveal from macular twig branch retinal vein occlusion. Stable overall currently at 6-week follow-up interval, repeat injection Avastin now  OS improved OS, 5 week post injection Avastin        Intravitreal Injection, Pharmacologic Agent - OD - Right Eye       Time Out 09/03/2020. 9:58 AM. Confirmed correct patient, procedure, site, and patient consented.   Anesthesia Topical anesthesia was used. Anesthetic medications included Akten 3.5%.   Procedure Preparation included Tobramycin 0.3%, 10% betadine to eyelids, 5% betadine to ocular surface, Ofloxacin . A 30 gauge needle was used.   Injection:  2.5 mg Bevacizumab (AVASTIN) 2.5mg /0.27mL SOSY   NDC: 07680-881-10, Lot: [id number]   Route: Intravitreal, Site: Right Eye  Post-op Post injection exam found visual acuity of at least counting fingers. The patient tolerated the procedure well. There were no  complications. The patient received written and verbal post procedure care education. Post injection medications were not given.                 ASSESSMENT/PLAN:  Branch retinal vein occlusion with macular edema of right eye The nature of branch retinal vein occlusion with macular edema was discussed.  The patient was given access to printed information.  The treatment options including continued observation looking for spontaneous resolution versus grid laser versus intravitreal Kenalog injection were discussed.  PRIMARY THERAPY CONSISTS of Anti-VEGF Therapies, AVASTIN, LUCENTIS AND EYLEA.  Their usage was discussed to assist in halting the progression of Macular Edema, in order to preserve, protect or improve acuity.  Additionally, at times, limited focal laser therapy is used in the management.  The risks and benefits of all these options were discussed with the patient.  The patient's questions were answered.  Chronic recurrence perifoveal CME secondary to BRVO.  The condition is sensitive to treatment yet requires relatively short interval of examination and therapy every 6-7 weeks in the past      ICD-10-CM   1. Branch retinal vein occlusion with macular edema of right eye  H34.8310 OCT, Retina - OU - Both Eyes    Intravitreal Injection, Pharmacologic Agent - OD - Right Eye    bevacizumab (AVASTIN) SOSY 2.5 mg    1.  CME secondary to BRVO OD, responsive and improved periodically post Avastin, today at 6-week intervals slightly improved again in stable acuity.  Repeat injection Avastin today and return visit in 6 to 7 weeks  2.  Dilate OS next as scheduled  3.  Ophthalmic Meds  Ordered this visit:  Meds ordered this encounter  Medications  . bevacizumab (AVASTIN) SOSY 2.5 mg       Return in about 7 weeks (around 10/22/2020) for dilate, OD, AVASTIN OCT.  There are no Patient Instructions on file for this visit.   Explained the diagnoses, plan, and follow up with the  patient and they expressed understanding.  Patient expressed understanding of the importance of proper follow up care.   Alford HighlandGary A. Lindalou Soltis M.D. Diseases & Surgery of the Retina and Vitreous [facility] 09/03/20     Abbreviations: M myopia (nearsighted); A astigmatism; H hyperopia (farsighted); P presbyopia; Mrx spectacle prescription;  CTL contact lenses; OD right eye; OS left eye; OU both eyes  XT exotropia; ET esotropia; PEK punctate epithelial keratitis; PEE punctate epithelial erosions; DES dry eye syndrome; MGD meibomian gland dysfunction; ATs artificial tears; PFAT's preservative free artificial tears; NSC nuclear sclerotic cataract; PSC posterior subcapsular cataract; ERM epi-retinal membrane; PVD posterior vitreous detachment; RD retinal detachment; DM diabetes mellitus; DR diabetic retinopathy; NPDR non-proliferative diabetic retinopathy; PDR proliferative diabetic retinopathy; CSME clinically significant macular edema; DME diabetic macular edema; dbh dot blot hemorrhages; CWS cotton wool spot; POAG primary open angle glaucoma; C/D cup-to-disc ratio; HVF humphrey visual field; GVF goldmann visual field; OCT optical coherence tomography; IOP intraocular pressure; BRVO Branch retinal vein occlusion; CRVO central retinal vein occlusion; CRAO central retinal artery occlusion; BRAO branch retinal artery occlusion; RT retinal tear; SB scleral buckle; PPV pars plana vitrectomy; VH Vitreous hemorrhage; PRP panretinal laser photocoagulation; IVK intravitreal kenalog; VMT vitreomacular traction; MH Macular hole;  NVD neovascularization of the disc; NVE neovascularization elsewhere; AREDS age related eye disease study; ARMD age related macular degeneration; POAG primary open angle glaucoma; EBMD epithelial/anterior basement membrane dystrophy; ACIOL anterior chamber intraocular lens; IOL intraocular lens; PCIOL posterior chamber intraocular lens; Phaco/IOL phacoemulsification with intraocular  lens placement; PRK photorefractive keratectomy; LASIK laser assisted in situ keratomileusis; HTN hypertension; DM diabetes mellitus; COPD chronic obstructive pulmonary disease

## 2020-09-03 NOTE — Assessment & Plan Note (Signed)
The nature of branch retinal vein occlusion with macular edema was discussed.  The patient was given access to printed information.  The treatment options including continued observation looking for spontaneous resolution versus grid laser versus intravitreal Kenalog injection were discussed.  PRIMARY THERAPY CONSISTS of Anti-VEGF Therapies, AVASTIN, LUCENTIS AND EYLEA.  Their usage was discussed to assist in halting the progression of Macular Edema, in order to preserve, protect or improve acuity.  Additionally, at times, limited focal laser therapy is used in the management.  The risks and benefits of all these options were discussed with the patient.  The patient's questions were answered.  Chronic recurrence perifoveal CME secondary to BRVO.  The condition is sensitive to treatment yet requires relatively short interval of examination and therapy every 6-7 weeks in the past

## 2020-09-10 ENCOUNTER — Encounter (INDEPENDENT_AMBULATORY_CARE_PROVIDER_SITE_OTHER): Payer: Self-pay | Admitting: Ophthalmology

## 2020-09-10 ENCOUNTER — Encounter (INDEPENDENT_AMBULATORY_CARE_PROVIDER_SITE_OTHER): Payer: Medicare PPO | Admitting: Ophthalmology

## 2020-09-10 ENCOUNTER — Other Ambulatory Visit: Payer: Self-pay

## 2020-09-10 ENCOUNTER — Ambulatory Visit (INDEPENDENT_AMBULATORY_CARE_PROVIDER_SITE_OTHER): Payer: Medicare PPO | Admitting: Ophthalmology

## 2020-09-10 DIAGNOSIS — H34832 Tributary (branch) retinal vein occlusion, left eye, with macular edema: Secondary | ICD-10-CM

## 2020-09-10 DIAGNOSIS — H34831 Tributary (branch) retinal vein occlusion, right eye, with macular edema: Secondary | ICD-10-CM | POA: Diagnosis not present

## 2020-09-10 MED ORDER — BEVACIZUMAB 2.5 MG/0.1ML IZ SOSY
2.5000 mg | PREFILLED_SYRINGE | INTRAVITREAL | Status: AC | PRN
  Administered 2020-09-10: 2.5 mg via INTRAVITREAL

## 2020-09-10 NOTE — Assessment & Plan Note (Signed)
Nicely improved 1 week post injection for chronic active BRVO, follow-up as scheduled

## 2020-09-10 NOTE — Assessment & Plan Note (Signed)
Overall improved yet with still active disease the superotemporal aspect of the foveal avascular zone for macular BRVO, repeat injection today to maintain, 6-week interval

## 2020-09-10 NOTE — Progress Notes (Signed)
09/10/2020     CHIEF COMPLAINT Patient presents for Retina Follow Up (8wk fu OS/Possible Avastin OS/Pt states VA OU stable since last visit. Pt denies FOL, floaters, or ocular pain OU. //)   HISTORY OF PRESENT ILLNESS: [name] is a 80 y.o. male who presents to the clinic today for:   HPI    Retina Follow Up    Patient presents with  CRVO/BRVO.  In left eye.  This started 8 weeks ago.  Severity is mild.  Duration of 8 weeks.  Since onset it is stable. Additional comments: 8wk fu OS/Possible Avastin OS Pt states VA OU stable since last visit. Pt denies FOL, floaters, or ocular pain OU.          Last edited by Demetrios LollBusick, Erica L, COA on 09/10/2020  9:34 AM. (History)      Referring physician: Philemon KingdomProchnau, Caroline, MD [address] [place] [id number]  HISTORICAL INFORMATION:   Selected notes from the MEDICAL RECORD NUMBER       CURRENT MEDICATIONS: No current outpatient medications on file. (Ophthalmic Drugs)   No current facility-administered medications for this visit. (Ophthalmic Drugs)   Current Outpatient Medications (Other)  Medication Sig  . aspirin 81 MG tablet Take 81 mg by mouth daily.  . carvedilol (COREG) 12.5 MG tablet Take 1 tablet (12.5 mg total) by mouth 2 (two) times daily.  . Cholecalciferol (VITAMIN D) 1000 UNITS capsule Take 1,000 Units by mouth daily.  . Coenzyme Q10 (COQ10) 100 MG CAPS Take 3 capsules by mouth daily.   . Cyanocobalamin (B-12) 1000 MCG CAPS Take 1 tablet by mouth daily.   Marland Kitchen. levothyroxine (SYNTHROID, LEVOTHROID) 75 MCG tablet Take 1 tablet by mouth daily.  . Misc Natural Products (PROSTATE HEALTH PO) Take 1 tablet by mouth daily. Unknown strength per patient  . Multiple Vitamin (MULTIVITAMIN) tablet Take 2 tablets by mouth daily.  . nitroGLYCERIN (NITROSTAT) 0.4 MG SL tablet Place 1 tablet under the tongue every 5 minutes as needed. (Patient taking differently: Place 0.4 mg under the tongue every 5 (five) minutes as needed for  chest pain.)  . Omega-3 Fatty Acids (FISH OIL) 1200 MG CAPS Take 1,200 mg by mouth 2 (two) times daily.   . rosuvastatin (CRESTOR) 5 MG tablet Take 1 tablet by mouth daily.  . tamsulosin (FLOMAX) 0.4 MG CAPS capsule Take 0.4 mg by mouth daily.    No current facility-administered medications for this visit. (Other)      REVIEW OF SYSTEMS:    ALLERGIES No Known Allergies  PAST MEDICAL HISTORY Past Medical History:  Diagnosis Date  . Anxiety   . ANXIETY 07/28/2010   Qualifier: Diagnosis of  By: Thad Rangereynolds LPN, [name]    . Arthritis    neck; limited neck movement  . Branch retinal vein occlusion with macular edema of left eye 09/05/2019  . Branch retinal vein occlusion with macular edema of right eye 09/05/2019  . Chronic cholecystitis 05/13/2020  . Chronic rhinitis   . Coronary artery disease   . Coronary artery disease involving native coronary artery of native heart 05/11/2016  . Depression    no treatment since he retired  . DEPRESSION 07/28/2010   Qualifier: Diagnosis of  By: Thad Rangereynolds LPN, [name]    . Dyspnea    with exertion  . Essential hypertension 07/28/2010   Qualifier: Diagnosis of  By: Thad Rangereynolds LPN, [name]    . Essential tremor 09/07/2017  . Gallbladder sludge 05/13/2020  . GERD (gastroesophageal  reflux disease)   . Headache    hx subdural hematoma  . Hyperlipidemia   . HYPERLIPIDEMIA 07/28/2010   Qualifier: Diagnosis of  By: [name] LPN, [name]    . Hypertension   . Hypothyroidism   . Intermediate stage nonexudative age-related macular degeneration of right eye 09/05/2019  . Ischemic cardiomyopathy 05/11/2016  . OBSTRUCTIVE SLEEP APNEA 07/29/2010   NPSG 2006:  AHI 18/hr. Pressure optimized to 12cm by auto device 2012    . OSA (obstructive sleep apnea)   . Posterior vitreous detachment of left eye 09/05/2019  . Posterior vitreous detachment of right eye 09/05/2019  . Postoperative examination 06/10/2020  . RESTLESS LEGS SYNDROME 07/29/2010   Trial of requip 2012:   +intolerance, didn't feel it made a difference   . Status post coronary artery bypass graft 05/11/2016   Overview:  2005  Formatting of this note might be different from the original. 2005  . Stroke [facility] ~19yrs ago   mild affects Lt eye  . Subdural hematoma (HCC) 2004   Past Surgical History:  Procedure Laterality Date  . APPENDECTOMY  1970s  . BRAIN HEMATOMA EVACUATION  ~2005   subdural hematoma  . CARPAL TUNNEL RELEASE Right 05/21/2016   Procedure: CARPAL TUNNEL RELEASE, right;  Surgeon: Cindee Salt, MD;  Location: [facility];  Service: Orthopedics;  Laterality: Right;  FAB  . CATARACT EXTRACTION Left   . CORONARY ARTERY BYPASS GRAFT  ~2006  . gall bladder removed   05/2020  . Heart bypass  2005  . ROTATOR CUFF REPAIR Right ~4 years    FAMILY HISTORY Family History  Problem Relation Age of Onset  . Heart disease Father   . Leukemia Son   . Stroke Mother     SOCIAL HISTORY Social History   Tobacco Use  . Smoking status: Never Smoker  . Smokeless tobacco: Never Used  Vaping Use  . Vaping Use: Never used  Substance Use Topics  . Alcohol use: No  . Drug use: No         OPHTHALMIC EXAM: Base Eye Exam    Visual Acuity (ETDRS)      Right Left   Dist [place] 20/20 20/20       Tonometry (Tonopen, 9:36 AM)      Right Left   Pressure 16 17       Pupils      Pupils Dark Light Shape React APD   Right PERRL 3 3 Round Minimal None   Left PERRL 3 3 Round Minimal None       Visual Fields (Counting fingers)      Left Right    Full Full       Extraocular Movement      Right Left    Full Full       Neuro/Psych    Oriented x3: Yes   Mood/Affect: Normal       Dilation    Left eye: 1.0% Mydriacyl, 2.5% Phenylephrine @ 9:36 AM        Slit Lamp and Fundus Exam    External Exam      Right Left   External Normal Normal       Slit Lamp Exam      Right Left   Lids/Lashes Normal Normal   Conjunctiva/Sclera White and quiet White and quiet    Cornea Clear Clear   Anterior Chamber Deep and quiet Deep and quiet   Iris Round and reactive Round and reactive   Lens  Posterior chamber intraocular lens Posterior chamber intraocular lens   Anterior Vitreous Normal Normal       Fundus Exam      Right Left   Posterior Vitreous  Posterior vitreous detachment   Disc  Pallor 1+   C/D Ratio  0.6   Macula  Microaneurysms along the inferotemporal border of the FAZ, Macular thickening, Hard drusen, no exudates, no atrophy, Cystoid macular edema   Vessels  Old macular branch retinal vein occlusion   Periphery  Normal          IMAGING AND PROCEDURES  Imaging and Procedures for 09/10/20  OCT, Retina - OU - Both Eyes       Right Eye Quality was good. Scan locations included subfoveal. Central Foveal Thickness: 374. Progression has improved. Findings include abnormal foveal contour, cystoid macular edema.   Left Eye Quality was good. Scan locations included subfoveal. Central Foveal Thickness: 322. Progression has improved. Findings include abnormal foveal contour.   Notes CME OD, juxta foveal from macular twig branch retinal vein occlusion. Stable overall currently at 1 -week follow-up interval,    OS improved OS, 6 week post injection Avastin, and overall stable will repeat injection Avastin OS today        Intravitreal Injection, Pharmacologic Agent - OS - Left Eye       Time Out 09/10/2020. 9:59 AM. Confirmed correct patient, procedure, site, and patient consented.   Anesthesia Topical anesthesia was used.   Procedure Preparation included Ofloxacin , 10% betadine to eyelids, 5% betadine to ocular surface. A 30 gauge needle was used.   Injection:  2.5 mg Bevacizumab (AVASTIN) 2.5mg /0.60mL SOSY   NDC: 61950-932-67, Lot: [id number]   Route: Intravitreal, Site: Left Eye  Post-op Post injection exam found visual acuity of at least counting fingers. The patient tolerated the procedure well. There were no complications. The  patient received written and verbal post procedure care education. Post injection medications were not given.   Notes Leiter's 0.1 cc                ASSESSMENT/PLAN:  Branch retinal vein occlusion with macular edema of right eye Nicely improved 1 week post injection for chronic active BRVO, follow-up as scheduled  Branch retinal vein occlusion with macular edema of left eye Overall improved yet with still active disease the superotemporal aspect of the foveal avascular zone for macular BRVO, repeat injection today to maintain, 6-week interval      ICD-10-CM   1. Branch retinal vein occlusion with macular edema of left eye  H34.8320 OCT, Retina - OU - Both Eyes    Intravitreal Injection, Pharmacologic Agent - OS - Left Eye    bevacizumab (AVASTIN) SOSY 2.5 mg  2. Branch retinal vein occlusion with macular edema of right eye  H34.8310     1.  Macular BRVO OU, improving post injection in each occurrence, 1 week OD vastly improved follow-up OD as scheduled  2.  OS stable and still with responsive disease post injection intravitreal Avastin at 6-week interval.  Repeat today injection OS follow-up in 6 weeks  3.  Ophthalmic Meds Ordered this visit:  Meds ordered this encounter  Medications  . bevacizumab (AVASTIN) SOSY 2.5 mg       Return in about 6 weeks (around 10/22/2020) for dilate, OS, AVASTIN OCT.  There are no Patient Instructions on file for this visit.   Explained the diagnoses, plan, and follow up with the patient and they expressed understanding.  Patient expressed understanding  of the importance of proper follow up care.   [name] M.D. Diseases & Surgery of the Retina and Vitreous [facility] 09/10/20     Abbreviations: M myopia (nearsighted); A astigmatism; H hyperopia (farsighted); P presbyopia; Mrx spectacle prescription;  CTL contact lenses; OD right eye; OS left eye; OU both eyes  XT exotropia; ET esotropia; PEK punctate  epithelial keratitis; PEE punctate epithelial erosions; DES dry eye syndrome; MGD meibomian gland dysfunction; ATs artificial tears; PFAT's preservative free artificial tears; NSC nuclear sclerotic cataract; PSC posterior subcapsular cataract; ERM epi-retinal membrane; PVD posterior vitreous detachment; RD retinal detachment; DM diabetes mellitus; DR diabetic retinopathy; NPDR non-proliferative diabetic retinopathy; PDR proliferative diabetic retinopathy; CSME clinically significant macular edema; DME diabetic macular edema; dbh dot blot hemorrhages; CWS cotton wool spot; POAG primary open angle glaucoma; C/D cup-to-disc ratio; HVF humphrey visual field; GVF goldmann visual field; OCT optical coherence tomography; IOP intraocular pressure; BRVO Branch retinal vein occlusion; CRVO central retinal vein occlusion; CRAO central retinal artery occlusion; BRAO branch retinal artery occlusion; RT retinal tear; SB scleral buckle; PPV pars plana vitrectomy; VH Vitreous hemorrhage; PRP panretinal laser photocoagulation; IVK intravitreal kenalog; VMT vitreomacular traction; MH Macular hole;  NVD neovascularization of the disc; NVE neovascularization elsewhere; AREDS age related eye disease study; ARMD age related macular degeneration; POAG primary open angle glaucoma; EBMD epithelial/anterior basement membrane dystrophy; ACIOL anterior chamber intraocular lens; IOL intraocular lens; PCIOL posterior chamber intraocular lens; Phaco/IOL phacoemulsification with intraocular lens placement; PRK photorefractive keratectomy; LASIK laser assisted in situ keratomileusis; HTN hypertension; DM diabetes mellitus; COPD chronic obstructive pulmonary disease

## 2020-09-17 DIAGNOSIS — G4733 Obstructive sleep apnea (adult) (pediatric): Secondary | ICD-10-CM | POA: Diagnosis not present

## 2020-09-18 DIAGNOSIS — L570 Actinic keratosis: Secondary | ICD-10-CM | POA: Diagnosis not present

## 2020-09-18 DIAGNOSIS — L853 Xerosis cutis: Secondary | ICD-10-CM | POA: Diagnosis not present

## 2020-09-18 DIAGNOSIS — D485 Neoplasm of uncertain behavior of skin: Secondary | ICD-10-CM | POA: Diagnosis not present

## 2020-09-30 DIAGNOSIS — K219 Gastro-esophageal reflux disease without esophagitis: Secondary | ICD-10-CM | POA: Diagnosis not present

## 2020-09-30 DIAGNOSIS — R634 Abnormal weight loss: Secondary | ICD-10-CM | POA: Diagnosis not present

## 2020-09-30 DIAGNOSIS — K648 Other hemorrhoids: Secondary | ICD-10-CM | POA: Diagnosis not present

## 2020-10-01 DIAGNOSIS — C4442 Squamous cell carcinoma of skin of scalp and neck: Secondary | ICD-10-CM | POA: Diagnosis not present

## 2020-10-10 DIAGNOSIS — E785 Hyperlipidemia, unspecified: Secondary | ICD-10-CM | POA: Diagnosis not present

## 2020-10-10 DIAGNOSIS — I255 Ischemic cardiomyopathy: Secondary | ICD-10-CM | POA: Diagnosis not present

## 2020-10-10 DIAGNOSIS — N1832 Chronic kidney disease, stage 3b: Secondary | ICD-10-CM | POA: Diagnosis not present

## 2020-10-10 DIAGNOSIS — I25119 Atherosclerotic heart disease of native coronary artery with unspecified angina pectoris: Secondary | ICD-10-CM | POA: Diagnosis not present

## 2020-10-10 DIAGNOSIS — I129 Hypertensive chronic kidney disease with stage 1 through stage 4 chronic kidney disease, or unspecified chronic kidney disease: Secondary | ICD-10-CM | POA: Diagnosis not present

## 2020-10-10 DIAGNOSIS — E039 Hypothyroidism, unspecified: Secondary | ICD-10-CM | POA: Diagnosis not present

## 2020-10-10 DIAGNOSIS — N183 Chronic kidney disease, stage 3 unspecified: Secondary | ICD-10-CM | POA: Diagnosis not present

## 2020-10-17 DIAGNOSIS — G4733 Obstructive sleep apnea (adult) (pediatric): Secondary | ICD-10-CM | POA: Diagnosis not present

## 2020-10-22 ENCOUNTER — Encounter (INDEPENDENT_AMBULATORY_CARE_PROVIDER_SITE_OTHER): Payer: Self-pay | Admitting: Ophthalmology

## 2020-10-22 ENCOUNTER — Ambulatory Visit (INDEPENDENT_AMBULATORY_CARE_PROVIDER_SITE_OTHER): Payer: Medicare PPO | Admitting: Ophthalmology

## 2020-10-22 ENCOUNTER — Other Ambulatory Visit: Payer: Self-pay

## 2020-10-22 DIAGNOSIS — H34832 Tributary (branch) retinal vein occlusion, left eye, with macular edema: Secondary | ICD-10-CM

## 2020-10-22 DIAGNOSIS — H34833 Tributary (branch) retinal vein occlusion, bilateral, with macular edema: Secondary | ICD-10-CM

## 2020-10-22 DIAGNOSIS — H34831 Tributary (branch) retinal vein occlusion, right eye, with macular edema: Secondary | ICD-10-CM

## 2020-10-22 MED ORDER — BEVACIZUMAB 2.5 MG/0.1ML IZ SOSY
2.5000 mg | PREFILLED_SYRINGE | INTRAVITREAL | Status: AC | PRN
  Administered 2020-10-22: 2.5 mg via INTRAVITREAL

## 2020-10-22 NOTE — Assessment & Plan Note (Signed)
At 7-week follow-up interval, slightly increased BRVO related CME.  We will repeat injection today at 7-week interval and decrease interval examination to 6 weeks

## 2020-10-22 NOTE — Assessment & Plan Note (Signed)
CME OS temporally overall stable at 6 weeks postinjection repeat injection today and examination again in 6 weeks

## 2020-10-22 NOTE — Progress Notes (Signed)
10/22/2020     CHIEF COMPLAINT Patient presents for Retina Follow Up (7 wk fu OD , Avastin OD and 6 wk fu OS, Avastin OS/Pt states VA OU stable since last visit. Pt denies FOL, floaters, or ocular pain OU. /)   HISTORY OF PRESENT ILLNESS: [name] is a 80 y.o. male who presents to the clinic today for:   HPI    Retina Follow Up    Diagnosis: CRVO/BRVO   Laterality: both eyes   Onset: 7 weeks ago   Severity: mild   Duration: 7 weeks   Course: stable   Comments: 7 wk fu OD , Avastin OD and 6 wk fu OS, Avastin OS Pt states VA OU stable since last visit. Pt denies FOL, floaters, or ocular pain OU.         Last edited by Demetrios LollBusick, Erica L, COA on 10/22/2020  9:31 AM. (History)      Referring physician: Philemon KingdomProchnau, Caroline, MD [address] [place] [id number]  HISTORICAL INFORMATION:   Selected notes from the MEDICAL RECORD NUMBER       CURRENT MEDICATIONS: No current outpatient medications on file. (Ophthalmic Drugs)   No current facility-administered medications for this visit. (Ophthalmic Drugs)   Current Outpatient Medications (Other)  Medication Sig  . aspirin 81 MG tablet Take 81 mg by mouth daily.  . carvedilol (COREG) 12.5 MG tablet Take 1 tablet (12.5 mg total) by mouth 2 (two) times daily.  . Cholecalciferol (VITAMIN D) 1000 UNITS capsule Take 1,000 Units by mouth daily.  . Coenzyme Q10 (COQ10) 100 MG CAPS Take 3 capsules by mouth daily.   . Cyanocobalamin (B-12) 1000 MCG CAPS Take 1 tablet by mouth daily.   Marland Kitchen. levothyroxine (SYNTHROID, LEVOTHROID) 75 MCG tablet Take 1 tablet by mouth daily.  . Misc Natural Products (PROSTATE HEALTH PO) Take 1 tablet by mouth daily. Unknown strength per patient  . Multiple Vitamin (MULTIVITAMIN) tablet Take 2 tablets by mouth daily.  . nitroGLYCERIN (NITROSTAT) 0.4 MG SL tablet Place 1 tablet under the tongue every 5 minutes as needed. (Patient taking differently: Place 0.4 mg under the tongue every 5 (five) minutes as  needed for chest pain.)  . Omega-3 Fatty Acids (FISH OIL) 1200 MG CAPS Take 1,200 mg by mouth 2 (two) times daily.   . rosuvastatin (CRESTOR) 5 MG tablet Take 1 tablet by mouth daily.  . tamsulosin (FLOMAX) 0.4 MG CAPS capsule Take 0.4 mg by mouth daily.    No current facility-administered medications for this visit. (Other)      REVIEW OF SYSTEMS:    ALLERGIES No Known Allergies  PAST MEDICAL HISTORY Past Medical History:  Diagnosis Date  . Anxiety   . ANXIETY 07/28/2010   Qualifier: Diagnosis of  By: Thad Rangereynolds LPN, [name]    . Arthritis    neck; limited neck movement  . Branch retinal vein occlusion with macular edema of left eye 09/05/2019  . Branch retinal vein occlusion with macular edema of right eye 09/05/2019  . Chronic cholecystitis 05/13/2020  . Chronic rhinitis   . Coronary artery disease   . Coronary artery disease involving native coronary artery of native heart 05/11/2016  . Depression    no treatment since he retired  . DEPRESSION 07/28/2010   Qualifier: Diagnosis of  By: Thad Rangereynolds LPN, [name]    . Dyspnea    with exertion  . Essential hypertension 07/28/2010   Qualifier: Diagnosis of  By: Thad Rangereynolds LPN, [name]    .  Essential tremor 09/07/2017  . Gallbladder sludge 05/13/2020  . GERD (gastroesophageal reflux disease)   . Headache    hx subdural hematoma  . Hyperlipidemia   . HYPERLIPIDEMIA 07/28/2010   Qualifier: Diagnosis of  By: [name] LPN, [name]    . Hypertension   . Hypothyroidism   . Intermediate stage nonexudative age-related macular degeneration of right eye 09/05/2019  . Ischemic cardiomyopathy 05/11/2016  . OBSTRUCTIVE SLEEP APNEA 07/29/2010   NPSG 2006:  AHI 18/hr. Pressure optimized to 12cm by auto device 2012    . OSA (obstructive sleep apnea)   . Posterior vitreous detachment of left eye 09/05/2019  . Posterior vitreous detachment of right eye 09/05/2019  . Postoperative examination 06/10/2020  . RESTLESS LEGS SYNDROME 07/29/2010   Trial of requip 2012:   +intolerance, didn't feel it made a difference   . Status post coronary artery bypass graft 05/11/2016   Overview:  2005  Formatting of this note might be different from the original. 2005  . Stroke [facility]) ~88yrs ago   mild affects Lt eye  . Subdural hematoma (HCC) 2004   Past Surgical History:  Procedure Laterality Date  . APPENDECTOMY  1970s  . BRAIN HEMATOMA EVACUATION  ~2005   subdural hematoma  . CARPAL TUNNEL RELEASE Right 05/21/2016   Procedure: CARPAL TUNNEL RELEASE, right;  Surgeon: Cindee Salt, MD;  Location: [facility];  Service: Orthopedics;  Laterality: Right;  FAB  . CATARACT EXTRACTION Left   . CORONARY ARTERY BYPASS GRAFT  ~2006  . gall bladder removed   05/2020  . Heart bypass  2005  . ROTATOR CUFF REPAIR Right ~4 years    FAMILY HISTORY Family History  Problem Relation Age of Onset  . Heart disease Father   . Leukemia Son   . Stroke Mother     SOCIAL HISTORY Social History   Tobacco Use  . Smoking status: Never Smoker  . Smokeless tobacco: Never Used  Vaping Use  . Vaping Use: Never used  Substance Use Topics  . Alcohol use: No  . Drug use: No         OPHTHALMIC EXAM: Base Eye Exam    Visual Acuity (ETDRS)      Right Left   Dist [place] 20/20 20/20       Tonometry (Tonopen, 9:34 AM)      Right Left   Pressure 14 14       Pupils      Pupils Dark Light Shape React APD   Right PERRL 3 3 Round Minimal None   Left PERRL 3 3 Round Minimal None       Visual Fields (Counting fingers)      Left Right    Full Full       Extraocular Movement      Right Left    Full Full       Neuro/Psych    Oriented x3: Yes   Mood/Affect: Normal       Dilation    Both eyes: 1.0% Mydriacyl, 2.5% Phenylephrine @ 9:34 AM        Slit Lamp and Fundus Exam    External Exam      Right Left   External Normal Normal       Slit Lamp Exam      Right Left   Lids/Lashes Normal Normal   Conjunctiva/Sclera White and quiet White and quiet    Cornea Clear Clear   Anterior Chamber Deep and quiet Deep and quiet  Iris Round and reactive Round and reactive   Lens Posterior chamber intraocular lens Posterior chamber intraocular lens   Anterior Vitreous Normal Normal       Fundus Exam      Right Left   Posterior Vitreous Posterior vitreous detachment Posterior vitreous detachment   Disc Normal Pallor 1+   C/D Ratio 0.35 0.6   Macula Cystoid macular edema, Macular thickening, no exudates, Microaneurysms Microaneurysms along the inferotemporal border of the FAZ, Macular thickening, Hard drusen, no exudates, no atrophy, Cystoid macular edema   Vessels Twig macular branch retinal vein occlusion. Old macular branch retinal vein occlusion   Periphery Normal Normal          IMAGING AND PROCEDURES  Imaging and Procedures for 10/22/20  OCT, Retina - OU - Both Eyes       Right Eye Quality was good. Scan locations included subfoveal. Central Foveal Thickness: 413. Progression has been stable. Findings include abnormal foveal contour, cystoid macular edema.   Left Eye Quality was good. Scan locations included subfoveal. Central Foveal Thickness: 312. Progression has improved. Findings include abnormal foveal contour.   Notes CME OD, juxta foveal from macular twig branch retinal vein occlusion. Stable overall currently at 7 -week follow-up interval, repeat injection OD today and again in 6 weeks OD   OS improved OS, 6 week post injection Avastin, and overall stable will repeat injection Avastin OS today        Intravitreal Injection, Pharmacologic Agent - OD - Right Eye       Time Out 10/22/2020. 10:49 AM. Confirmed correct patient, procedure, site, and patient consented.   Anesthesia Topical anesthesia was used. Anesthetic medications included Akten 3.5%.   Procedure Preparation included Tobramycin 0.3%, 10% betadine to eyelids, 5% betadine to ocular surface, Ofloxacin . A 30 gauge needle was used.   Injection:  2.5  mg Bevacizumab (AVASTIN) 2.5mg /0.22mL SOSY   NDC: 42706-237-62, Lot: [id number]   Route: Intravitreal, Site: Right Eye  Post-op Post injection exam found visual acuity of at least counting fingers. The patient tolerated the procedure well. There were no complications. The patient received written and verbal post procedure care education. Post injection medications were not given.        Intravitreal Injection, Pharmacologic Agent - OS - Left Eye       Time Out 10/22/2020. 10:50 AM. Confirmed correct patient, procedure, site, and patient consented.   Anesthesia Topical anesthesia was used.   Procedure Preparation included Ofloxacin , 10% betadine to eyelids, 5% betadine to ocular surface. A 30 gauge needle was used.   Injection:  2.5 mg Bevacizumab (AVASTIN) 2.5mg /0.26mL SOSY   NDC: 16073-710-62, Lot: [id number]   Route: Intravitreal, Site: Left Eye  Post-op Post injection exam found visual acuity of at least counting fingers. The patient tolerated the procedure well. There were no complications. The patient received written and verbal post procedure care education. Post injection medications were not given.   Notes Leiter's 0.1 cc                ASSESSMENT/PLAN:  Branch retinal vein occlusion with macular edema of right eye At 7-week follow-up interval, slightly increased BRVO related CME.  We will repeat injection today at 7-week interval and decrease interval examination to 6 weeks  Branch retinal vein occlusion with macular edema of left eye CME OS temporally overall stable at 6 weeks postinjection repeat injection today and examination again in 6 weeks      ICD-10-CM   1. Branch retinal  vein occlusion with macular edema of right eye  H34.8310 OCT, Retina - OU - Both Eyes    Intravitreal Injection, Pharmacologic Agent - OD - Right Eye    bevacizumab (AVASTIN) SOSY 2.5 mg  2. Branch retinal vein occlusion with macular edema of left eye  H34.8320 OCT, Retina - OU - Both  Eyes    Intravitreal Injection, Pharmacologic Agent - OS - Left Eye    bevacizumab (AVASTIN) SOSY 2.5 mg    1.  Overall condition macula each eye stable with preserved acuity yet with increased CME OD.  Repeat injection intravitreal Avastin OD today at 7-week interval and decrease interval examination right eye to 6 weeks 2.  Stable macular condition left eye at 6-week interval, repeat injection today and examination in 6 weeks  3.  Ophthalmic Meds Ordered this visit:  Meds ordered this encounter  Medications  . bevacizumab (AVASTIN) SOSY 2.5 mg  . bevacizumab (AVASTIN) SOSY 2.5 mg       Return in about 6 weeks (around 12/03/2020) for DILATE OU, AVASTIN OCT, OD, OS.  There are no Patient Instructions on file for this visit.   Explained the diagnoses, plan, and follow up with the patient and they expressed understanding.  Patient expressed understanding of the importance of proper follow up care.   Alford Highland Cisco Kindt M.D. Diseases & Surgery of the Retina and Vitreous [facility] 10/22/20     Abbreviations: M myopia (nearsighted); A astigmatism; H hyperopia (farsighted); P presbyopia; Mrx spectacle prescription;  CTL contact lenses; OD right eye; OS left eye; OU both eyes  XT exotropia; ET esotropia; PEK punctate epithelial keratitis; PEE punctate epithelial erosions; DES dry eye syndrome; MGD meibomian gland dysfunction; ATs artificial tears; PFAT's preservative free artificial tears; NSC nuclear sclerotic cataract; PSC posterior subcapsular cataract; ERM epi-retinal membrane; PVD posterior vitreous detachment; RD retinal detachment; DM diabetes mellitus; DR diabetic retinopathy; NPDR non-proliferative diabetic retinopathy; PDR proliferative diabetic retinopathy; CSME clinically significant macular edema; DME diabetic macular edema; dbh dot blot hemorrhages; CWS cotton wool spot; POAG primary open angle glaucoma; C/D cup-to-disc ratio; HVF humphrey visual field; GVF  goldmann visual field; OCT optical coherence tomography; IOP intraocular pressure; BRVO Branch retinal vein occlusion; CRVO central retinal vein occlusion; CRAO central retinal artery occlusion; BRAO branch retinal artery occlusion; RT retinal tear; SB scleral buckle; PPV pars plana vitrectomy; VH Vitreous hemorrhage; PRP panretinal laser photocoagulation; IVK intravitreal kenalog; VMT vitreomacular traction; MH Macular hole;  NVD neovascularization of the disc; NVE neovascularization elsewhere; AREDS age related eye disease study; ARMD age related macular degeneration; POAG primary open angle glaucoma; EBMD epithelial/anterior basement membrane dystrophy; ACIOL anterior chamber intraocular lens; IOL intraocular lens; PCIOL posterior chamber intraocular lens; Phaco/IOL phacoemulsification with intraocular lens placement; PRK photorefractive keratectomy; LASIK laser assisted in situ keratomileusis; HTN hypertension; DM diabetes mellitus; COPD chronic obstructive pulmonary disease

## 2020-10-25 DIAGNOSIS — D122 Benign neoplasm of ascending colon: Secondary | ICD-10-CM | POA: Diagnosis not present

## 2020-10-25 DIAGNOSIS — D121 Benign neoplasm of appendix: Secondary | ICD-10-CM | POA: Diagnosis not present

## 2020-10-25 DIAGNOSIS — I10 Essential (primary) hypertension: Secondary | ICD-10-CM | POA: Diagnosis not present

## 2020-10-25 DIAGNOSIS — K5730 Diverticulosis of large intestine without perforation or abscess without bleeding: Secondary | ICD-10-CM | POA: Diagnosis not present

## 2020-10-25 DIAGNOSIS — K635 Polyp of colon: Secondary | ICD-10-CM | POA: Diagnosis not present

## 2020-10-25 DIAGNOSIS — I2510 Atherosclerotic heart disease of native coronary artery without angina pectoris: Secondary | ICD-10-CM | POA: Diagnosis not present

## 2020-10-25 DIAGNOSIS — Z86010 Personal history of colonic polyps: Secondary | ICD-10-CM | POA: Diagnosis not present

## 2020-10-30 DIAGNOSIS — I2510 Atherosclerotic heart disease of native coronary artery without angina pectoris: Secondary | ICD-10-CM | POA: Diagnosis not present

## 2020-10-30 DIAGNOSIS — Z79899 Other long term (current) drug therapy: Secondary | ICD-10-CM | POA: Diagnosis not present

## 2020-10-30 DIAGNOSIS — Z125 Encounter for screening for malignant neoplasm of prostate: Secondary | ICD-10-CM | POA: Diagnosis not present

## 2020-10-30 DIAGNOSIS — E785 Hyperlipidemia, unspecified: Secondary | ICD-10-CM | POA: Diagnosis not present

## 2020-10-30 DIAGNOSIS — Z23 Encounter for immunization: Secondary | ICD-10-CM | POA: Diagnosis not present

## 2020-10-30 DIAGNOSIS — R7301 Impaired fasting glucose: Secondary | ICD-10-CM | POA: Diagnosis not present

## 2020-10-30 DIAGNOSIS — E039 Hypothyroidism, unspecified: Secondary | ICD-10-CM | POA: Diagnosis not present

## 2020-10-30 DIAGNOSIS — I10 Essential (primary) hypertension: Secondary | ICD-10-CM | POA: Diagnosis not present

## 2020-11-17 DIAGNOSIS — G4733 Obstructive sleep apnea (adult) (pediatric): Secondary | ICD-10-CM | POA: Diagnosis not present

## 2020-12-03 ENCOUNTER — Ambulatory Visit (INDEPENDENT_AMBULATORY_CARE_PROVIDER_SITE_OTHER): Payer: Medicare PPO | Admitting: Ophthalmology

## 2020-12-03 ENCOUNTER — Encounter (INDEPENDENT_AMBULATORY_CARE_PROVIDER_SITE_OTHER): Payer: Self-pay | Admitting: Ophthalmology

## 2020-12-03 ENCOUNTER — Other Ambulatory Visit: Payer: Self-pay

## 2020-12-03 DIAGNOSIS — H34831 Tributary (branch) retinal vein occlusion, right eye, with macular edema: Secondary | ICD-10-CM

## 2020-12-03 DIAGNOSIS — H34832 Tributary (branch) retinal vein occlusion, left eye, with macular edema: Secondary | ICD-10-CM | POA: Diagnosis not present

## 2020-12-03 DIAGNOSIS — H34833 Tributary (branch) retinal vein occlusion, bilateral, with macular edema: Secondary | ICD-10-CM | POA: Diagnosis not present

## 2020-12-03 MED ORDER — BEVACIZUMAB 2.5 MG/0.1ML IZ SOSY
2.5000 mg | PREFILLED_SYRINGE | INTRAVITREAL | Status: AC | PRN
  Administered 2020-12-03: 2.5 mg via INTRAVITREAL

## 2020-12-03 NOTE — Assessment & Plan Note (Signed)
The nature of branch retinal vein occlusion with macular edema was discussed.  The patient was given access to printed information.  The treatment options including continued observation looking for spontaneous resolution versus grid laser versus intravitreal Kenalog injection were discussed.  PRIMARY THERAPY CONSISTS of Anti-VEGF Therapies, AVASTIN, LUCENTIS AND EYLEA.  Their usage was discussed to assist in halting the progression of Macular Edema, in order to preserve, protect or improve acuity.  Additionally, at times, limited focal laser therapy is used in the management.  The risks and benefits of all these options were discussed with the patient.  The patient's questions were answered. 

## 2020-12-03 NOTE — Progress Notes (Signed)
12/03/2020     CHIEF COMPLAINT Patient presents for Retina Follow Up (6 week fu OU and Avastin OU/Pt states VA OU stable since last visit. Pt denies FOL, floaters, or ocular pain OU. /)   HISTORY OF PRESENT ILLNESS: [name] is a 80 y.o. male who presents to the clinic today for:   HPI     Retina Follow Up           Diagnosis: CRVO/BRVO   Laterality: both eyes   Onset: 6 weeks ago   Severity: mild   Duration: 6 weeks   Course: stable   Comments: 6 week fu OU and Avastin OU Pt states VA OU stable since last visit. Pt denies FOL, floaters, or ocular pain OU.           Comments   OD, stable acuity, OS similar, no increase in blurred vision OU OD today at 6-week post injection Avastin  OS today at 12-week interval post injection Avastin      Last edited by [name], MD on 12/03/2020 10:06 AM.      Referring physician: [name], MD [address] [place],  [place] [address]  HISTORICAL INFORMATION:   Selected notes from the MEDICAL RECORD NUMBER       CURRENT MEDICATIONS: No current outpatient medications on file. (Ophthalmic Drugs)   No current facility-administered medications for this visit. (Ophthalmic Drugs)   Current Outpatient Medications (Other)  Medication Sig   aspirin 81 MG tablet Take 81 mg by mouth daily.   carvedilol (COREG) 12.5 MG tablet Take 1 tablet (12.5 mg total) by mouth 2 (two) times daily.   Cholecalciferol (VITAMIN D) 1000 UNITS capsule Take 1,000 Units by mouth daily.   Coenzyme Q10 (COQ10) 100 MG CAPS Take 3 capsules by mouth daily.    Cyanocobalamin (B-12) 1000 MCG CAPS Take 1 tablet by mouth daily.    levothyroxine (SYNTHROID, LEVOTHROID) 75 MCG tablet Take 1 tablet by mouth daily.   Misc Natural Products (PROSTATE HEALTH PO) Take 1 tablet by mouth daily. Unknown strength per patient   Multiple Vitamin (MULTIVITAMIN) tablet Take 2 tablets by mouth daily.   nitroGLYCERIN (NITROSTAT) 0.4 MG SL tablet Place 1 tablet  under the tongue every 5 minutes as needed. (Patient taking differently: Place 0.4 mg under the tongue every 5 (five) minutes as needed for chest pain.)   Omega-3 Fatty Acids (FISH OIL) 1200 MG CAPS Take 1,200 mg by mouth 2 (two) times daily.    rosuvastatin (CRESTOR) 5 MG tablet Take 1 tablet by mouth daily.   tamsulosin (FLOMAX) 0.4 MG CAPS capsule Take 0.4 mg by mouth daily.    No current facility-administered medications for this visit. (Other)      REVIEW OF SYSTEMS:    ALLERGIES No Known Allergies  PAST MEDICAL HISTORY Past Medical History:  Diagnosis Date   Anxiety    ANXIETY 07/28/2010   Qualifier: Diagnosis of  By: [name] LPN, [name]     Arthritis    neck; limited neck movement   Branch retinal vein occlusion with macular edema of left eye 09/05/2019   Branch retinal vein occlusion with macular edema of right eye 09/05/2019   Chronic cholecystitis 05/13/2020   Chronic rhinitis    Coronary artery disease    Coronary artery disease involving native coronary artery of native heart 05/11/2016   Depression    no treatment since he retired   DEPRESSION 07/28/2010   Qualifier: Diagnosis of  By: [name]  LPN, [name]     Dyspnea    with exertion   Essential hypertension 07/28/2010   Qualifier: Diagnosis of  By: [name] LPN, [name]     Essential tremor 09/07/2017   Gallbladder sludge 05/13/2020   GERD (gastroesophageal reflux disease)    Headache    hx subdural hematoma   Hyperlipidemia    HYPERLIPIDEMIA 07/28/2010   Qualifier: Diagnosis of  By: [name] LPN, [name]     Hypertension    Hypothyroidism    Intermediate stage nonexudative age-related macular degeneration of right eye 09/05/2019   Ischemic cardiomyopathy 05/11/2016   OBSTRUCTIVE SLEEP APNEA 07/29/2010   NPSG 2006:  AHI 18/hr. Pressure optimized to 12cm by auto device 2012     OSA (obstructive sleep apnea)    Posterior vitreous detachment of left eye 09/05/2019   Posterior vitreous detachment of right eye 09/05/2019    Postoperative examination 06/10/2020   RESTLESS LEGS SYNDROME 07/29/2010   Trial of requip 2012:  +intolerance, didn't feel it made a difference    Status post coronary artery bypass graft 05/11/2016   Overview:  2005  Formatting of this note might be different from the original. 2005   Stroke [facility]) ~54yrs ago   mild affects Lt eye   Subdural hematoma (HCC) 2004   Past Surgical History:  Procedure Laterality Date   APPENDECTOMY  1970s   BRAIN HEMATOMA EVACUATION  ~2005   subdural hematoma   CARPAL TUNNEL RELEASE Right 05/21/2016   Procedure: CARPAL TUNNEL RELEASE, right;  Surgeon: Cindee Salt, MD;  Location: [facility];  Service: Orthopedics;  Laterality: Right;  FAB   CATARACT EXTRACTION Left    CORONARY ARTERY BYPASS GRAFT  ~2006   gall bladder removed   05/2020   Heart bypass  2005   ROTATOR CUFF REPAIR Right ~4 years    FAMILY HISTORY Family History  Problem Relation Age of Onset   Heart disease Father    Leukemia Son    Stroke Mother     SOCIAL HISTORY Social History   Tobacco Use   Smoking status: Never   Smokeless tobacco: Never  Vaping Use   Vaping Use: Never used  Substance Use Topics   Alcohol use: No   Drug use: No         OPHTHALMIC EXAM:  Base Eye Exam     Visual Acuity (ETDRS)       Right Left   Dist [place] 20/20 20/20         Tonometry (Tonopen, 9:34 AM)       Right Left   Pressure 17 17         Pupils       Pupils Dark Light Shape React APD   Right PERRL 3 3 Round Minimal None   Left PERRL 3 3 Round Minimal None         Visual Fields (Counting fingers)       Left Right    Full Full         Extraocular Movement       Right Left    Full Full         Neuro/Psych     Oriented x3: Yes   Mood/Affect: Normal         Dilation     Both eyes: 1.0% Mydriacyl, 2.5% Phenylephrine @ 9:34 AM           Slit Lamp and Fundus Exam     External Exam  Right Left   External Normal Normal          Slit Lamp Exam       Right Left   Lids/Lashes Normal Normal   Conjunctiva/Sclera White and quiet White and quiet   Cornea Clear Clear   Anterior Chamber Deep and quiet Deep and quiet   Iris Round and reactive Round and reactive   Lens Posterior chamber intraocular lens Posterior chamber intraocular lens   Anterior Vitreous Normal Normal         Fundus Exam       Right Left   Posterior Vitreous Posterior vitreous detachment Posterior vitreous detachment   Disc Normal Pallor 1+   C/D Ratio 0.35 0.6   Macula Cystoid macular edema, Macular thickening, no exudates, Microaneurysms Microaneurysms along the inferotemporal border of the FAZ, Macular thickening, Hard drusen, no exudates, no atrophy, Cystoid macular edema   Vessels Twig macular branch retinal vein occlusion. Old macular branch retinal vein occlusion   Periphery Normal Normal            IMAGING AND PROCEDURES  Imaging and Procedures for 12/03/20  OCT, Retina - OU - Both Eyes       Right Eye Quality was good. Scan locations included subfoveal. Central Foveal Thickness: 416. Progression has been stable. Findings include abnormal foveal contour, cystoid macular edema.   Left Eye Quality was good. Scan locations included subfoveal. Central Foveal Thickness: 305. Progression has improved. Findings include abnormal foveal contour, cystoid macular edema.   Notes CME OD, juxta foveal from macular twig branch retinal vein occlusion. Stable overall currently at 6-week follow-up interval, repeat injection OD today and again in 6 weeks OD   OS improved OS,  12 week post injection Avastin, and overall stable will repeat injection Avastin OS today      Intravitreal Injection, Pharmacologic Agent - OD - Right Eye       Time Out 12/03/2020. 10:06 AM. Confirmed correct patient, procedure, site, and patient consented.   Anesthesia Topical anesthesia was used. Anesthetic medications included Akten 3.5%.    Procedure Preparation included Tobramycin 0.3%, 10% betadine to eyelids, 5% betadine to ocular surface, Ofloxacin . A 30 gauge needle was used.   Injection: 2.5 mg bevacizumab 2.5 MG/0.1ML   Route: Intravitreal, Site: Right Eye   NDC: [phone], Lot: [id number]   Post-op Post injection exam found visual acuity of at least counting fingers. The patient tolerated the procedure well. There were no complications. The patient received written and verbal post procedure care education. Post injection medications were not given.      Intravitreal Injection, Pharmacologic Agent - OS - Left Eye       Time Out 12/03/2020. 10:07 AM. Confirmed correct patient, procedure, site, and patient consented.   Anesthesia Topical anesthesia was used.   Procedure Preparation included Ofloxacin , 10% betadine to eyelids, 5% betadine to ocular surface. A 30 gauge needle was used.   Injection: 2.5 mg bevacizumab 2.5 MG/0.1ML   Route: Intravitreal, Site: Left Eye   NDC: [phone], Lot: [id number]   Post-op Post injection exam found visual acuity of at least counting fingers. The patient tolerated the procedure well. There were no complications. The patient received written and verbal post procedure care education. Post injection medications were not given.   Notes Leiter's 0.1 cc             ASSESSMENT/PLAN:  Branch retinal vein occlusion with macular edema of right eye The nature of branch retinal vein occlusion with  macular edema was discussed.  The patient was given access to printed information.  The treatment options including continued observation looking for spontaneous resolution versus grid laser versus intravitreal Kenalog injection were discussed.  PRIMARY THERAPY CONSISTS of Anti-VEGF Therapies, AVASTIN, LUCENTIS AND EYLEA.  Their usage was discussed to assist in halting the progression of Macular Edema, in order to preserve, protect or improve acuity.  Additionally, at times, limited  focal laser therapy is used in the management.  The risks and benefits of all these options were discussed with the patient.  The patient's questions were answered.  Branch retinal vein occlusion with macular edema of left eye The nature of branch retinal vein occlusion with macular edema was discussed.  The patient was given access to printed information.  The treatment options including continued observation looking for spontaneous resolution versus grid laser versus intravitreal Kenalog injection were discussed.  PRIMARY THERAPY CONSISTS of Anti-VEGF Therapies, AVASTIN, LUCENTIS AND EYLEA.  Their usage was discussed to assist in halting the progression of Macular Edema, in order to preserve, protect or improve acuity.  Additionally, at times, limited focal laser therapy is used in the management.  The risks and benefits of all these options were discussed with the patient.  The patient's questions were answered.     ICD-10-CM   1. Branch retinal vein occlusion with macular edema of left eye  H34.8320 OCT, Retina - OU - Both Eyes    Intravitreal Injection, Pharmacologic Agent - OS - Left Eye    bevacizumab (AVASTIN) SOSY 2.5 mg    2. Branch retinal vein occlusion with macular edema of right eye  H34.8310 OCT, Retina - OU - Both Eyes    Intravitreal Injection, Pharmacologic Agent - OD - Right Eye    bevacizumab (AVASTIN) SOSY 2.5 mg      1.  OD, with juxta foveal CME persistent and still responsive to therapy with intravitreal Avastin.  Preserved visual acuity.  We will repeat injection OD today Avastin and maintain 6-week follow-up  2.  OS, improving and apparent collateralization of juxta foveal CME from macular branch retinal vein occlusion stable now at 12-week interval.  Repeat injection today to maintain and follow-up OS in 12-week  3.  Ophthalmic Meds Ordered this visit:  Meds ordered this encounter  Medications   bevacizumab (AVASTIN) SOSY 2.5 mg   bevacizumab (AVASTIN) SOSY 2.5 mg        Return in about 6 weeks (around 01/14/2021) for dilate, OD, AVASTIN OCT.  There are no Patient Instructions on file for this visit.   Explained the diagnoses, plan, and follow up with the patient and they expressed understanding.  Patient expressed understanding of the importance of proper follow up care.   Alford Highland Revella Shelton M.D. Diseases & Surgery of the Retina and Vitreous [facility] 12/03/20     Abbreviations: M myopia (nearsighted); A astigmatism; H hyperopia (farsighted); P presbyopia; Mrx spectacle prescription;  CTL contact lenses; OD right eye; OS left eye; OU both eyes  XT exotropia; ET esotropia; PEK punctate epithelial keratitis; PEE punctate epithelial erosions; DES dry eye syndrome; MGD meibomian gland dysfunction; ATs artificial tears; PFAT's preservative free artificial tears; NSC nuclear sclerotic cataract; PSC posterior subcapsular cataract; ERM epi-retinal membrane; PVD posterior vitreous detachment; RD retinal detachment; DM diabetes mellitus; DR diabetic retinopathy; NPDR non-proliferative diabetic retinopathy; PDR proliferative diabetic retinopathy; CSME clinically significant macular edema; DME diabetic macular edema; dbh dot blot hemorrhages; CWS cotton wool spot; POAG primary open angle glaucoma;  C/D cup-to-disc ratio; HVF humphrey visual field; GVF goldmann visual field; OCT optical coherence tomography; IOP intraocular pressure; BRVO Branch retinal vein occlusion; CRVO central retinal vein occlusion; CRAO central retinal artery occlusion; BRAO branch retinal artery occlusion; RT retinal tear; SB scleral buckle; PPV pars plana vitrectomy; VH Vitreous hemorrhage; PRP panretinal laser photocoagulation; IVK intravitreal kenalog; VMT vitreomacular traction; MH Macular hole;  NVD neovascularization of the disc; NVE neovascularization elsewhere; AREDS age related eye disease study; ARMD age related macular degeneration; POAG primary open angle glaucoma;  EBMD epithelial/anterior basement membrane dystrophy; ACIOL anterior chamber intraocular lens; IOL intraocular lens; PCIOL posterior chamber intraocular lens; Phaco/IOL phacoemulsification with intraocular lens placement; PRK photorefractive keratectomy; LASIK laser assisted in situ keratomileusis; HTN hypertension; DM diabetes mellitus; COPD chronic obstructive pulmonary disease

## 2020-12-17 DIAGNOSIS — G4733 Obstructive sleep apnea (adult) (pediatric): Secondary | ICD-10-CM | POA: Diagnosis not present

## 2021-01-13 ENCOUNTER — Encounter (INDEPENDENT_AMBULATORY_CARE_PROVIDER_SITE_OTHER): Payer: Medicare PPO | Admitting: Ophthalmology

## 2021-01-14 ENCOUNTER — Encounter (INDEPENDENT_AMBULATORY_CARE_PROVIDER_SITE_OTHER): Payer: Self-pay | Admitting: Ophthalmology

## 2021-01-14 ENCOUNTER — Other Ambulatory Visit: Payer: Self-pay

## 2021-01-14 ENCOUNTER — Ambulatory Visit (INDEPENDENT_AMBULATORY_CARE_PROVIDER_SITE_OTHER): Payer: Medicare PPO | Admitting: Ophthalmology

## 2021-01-14 DIAGNOSIS — H34832 Tributary (branch) retinal vein occlusion, left eye, with macular edema: Secondary | ICD-10-CM

## 2021-01-14 DIAGNOSIS — H34831 Tributary (branch) retinal vein occlusion, right eye, with macular edema: Secondary | ICD-10-CM | POA: Diagnosis not present

## 2021-01-14 MED ORDER — BEVACIZUMAB 2.5 MG/0.1ML IZ SOSY
2.5000 mg | PREFILLED_SYRINGE | INTRAVITREAL | Status: AC | PRN
  Administered 2021-01-14: 2.5 mg via INTRAVITREAL

## 2021-01-14 NOTE — Assessment & Plan Note (Signed)
Improved CME from macular 20 degree BRVO at 6-week interval follow-up as scheduled OS

## 2021-01-14 NOTE — Assessment & Plan Note (Signed)
Very active twig BRVO with CME still active at 6-week postinjection Avastin repeat injection today to maintain visual acuity function and prevent progression

## 2021-01-14 NOTE — Progress Notes (Signed)
01/14/2021     CHIEF COMPLAINT Patient presents for Retina Follow Up   HISTORY OF PRESENT ILLNESS: [name] is a 80 y.o. male who presents to the clinic today for:   HPI     Retina Follow Up           Diagnosis: CRVO/BRVO   Laterality: right eye   Onset: 6 weeks ago   Severity: mild   Duration: 6 weeks   Course: stable         Comments   6 week fu OD and Avastin OD Pt states [facility] OU stable since last visit. Pt denies FOL, floaters, or ocular pain OU.        Last edited by Demetrios Loll, COA on 01/14/2021 10:03 AM.      Referring physician: [name], MD [address] [place],  [place] [address]  HISTORICAL INFORMATION:   Selected notes from the MEDICAL RECORD NUMBER       CURRENT MEDICATIONS: No current outpatient medications on file. (Ophthalmic Drugs)   No current facility-administered medications for this visit. (Ophthalmic Drugs)   Current Outpatient Medications (Other)  Medication Sig   aspirin 81 MG tablet Take 81 mg by mouth daily.   carvedilol (COREG) 12.5 MG tablet Take 1 tablet (12.5 mg total) by mouth 2 (two) times daily.   Cholecalciferol (VITAMIN D) 1000 UNITS capsule Take 1,000 Units by mouth daily.   Coenzyme Q10 (COQ10) 100 MG CAPS Take 3 capsules by mouth daily.    Cyanocobalamin (B-12) 1000 MCG CAPS Take 1 tablet by mouth daily.    levothyroxine (SYNTHROID, LEVOTHROID) 75 MCG tablet Take 1 tablet by mouth daily.   Misc Natural Products (PROSTATE HEALTH PO) Take 1 tablet by mouth daily. Unknown strength per patient   Multiple Vitamin (MULTIVITAMIN) tablet Take 2 tablets by mouth daily.   nitroGLYCERIN (NITROSTAT) 0.4 MG SL tablet Place 1 tablet under the tongue every 5 minutes as needed. (Patient taking differently: Place 0.4 mg under the tongue every 5 (five) minutes as needed for chest pain.)   Omega-3 Fatty Acids (FISH OIL) 1200 MG CAPS Take 1,200 mg by mouth 2 (two) times daily.    rosuvastatin (CRESTOR) 5 MG tablet Take 1  tablet by mouth daily.   tamsulosin (FLOMAX) 0.4 MG CAPS capsule Take 0.4 mg by mouth daily.    No current facility-administered medications for this visit. (Other)      REVIEW OF SYSTEMS:    ALLERGIES No Known Allergies  PAST MEDICAL HISTORY Past Medical History:  Diagnosis Date   Anxiety    ANXIETY 07/28/2010   Qualifier: Diagnosis of  By: [name] LPN, [name]     Arthritis    neck; limited neck movement   Branch retinal vein occlusion with macular edema of left eye 09/05/2019   Branch retinal vein occlusion with macular edema of right eye 09/05/2019   Chronic cholecystitis 05/13/2020   Chronic rhinitis    Coronary artery disease    Coronary artery disease involving native coronary artery of native heart 05/11/2016   Depression    no treatment since he retired   DEPRESSION 07/28/2010   Qualifier: Diagnosis of  By: [name] LPN, [name]     Dyspnea    with exertion   Essential hypertension 07/28/2010   Qualifier: Diagnosis of  By: [name] LPN, [name]     Essential tremor 09/07/2017   Gallbladder sludge 05/13/2020   GERD (gastroesophageal reflux disease)    Headache    hx  subdural hematoma   Hyperlipidemia    HYPERLIPIDEMIA 07/28/2010   Qualifier: Diagnosis of  By: Thad Rangereynolds LPN, [name]     Hypertension    Hypothyroidism    Intermediate stage nonexudative age-related macular degeneration of right eye 09/05/2019   Ischemic cardiomyopathy 05/11/2016   OBSTRUCTIVE SLEEP APNEA 07/29/2010   NPSG 2006:  AHI 18/hr. Pressure optimized to 12cm by auto device 2012     OSA (obstructive sleep apnea)    Posterior vitreous detachment of left eye 09/05/2019   Posterior vitreous detachment of right eye 09/05/2019   Postoperative examination 06/10/2020   RESTLESS LEGS SYNDROME 07/29/2010   Trial of requip 2012:  +intolerance, didn't feel it made a difference    Status post coronary artery bypass graft 05/11/2016   Overview:  2005  Formatting of this note might be different from the original. 2005    Stroke [facility](HCC) ~395yrs ago   mild affects Lt eye   Subdural hematoma (HCC) 2004   Past Surgical History:  Procedure Laterality Date   APPENDECTOMY  1970s   BRAIN HEMATOMA EVACUATION  ~2005   subdural hematoma   CARPAL TUNNEL RELEASE Right 05/21/2016   Procedure: CARPAL TUNNEL RELEASE, right;  Surgeon: Cindee SaltGary Kuzma, MD;  Location: [facility];  Service: Orthopedics;  Laterality: Right;  FAB   CATARACT EXTRACTION Left    CORONARY ARTERY BYPASS GRAFT  ~2006   gall bladder removed   05/2020   Heart bypass  2005   ROTATOR CUFF REPAIR Right ~4 years    FAMILY HISTORY Family History  Problem Relation Age of Onset   Heart disease Father    Leukemia Son    Stroke Mother     SOCIAL HISTORY Social History   Tobacco Use   Smoking status: Never   Smokeless tobacco: Never  Vaping Use   Vaping Use: Never used  Substance Use Topics   Alcohol use: No   Drug use: No         OPHTHALMIC EXAM:  Base Eye Exam     Visual Acuity (ETDRS)       Right Left   Dist [place] 20/20 20/20         Tonometry (Tonopen, 10:05 AM)       Right Left   Pressure 13 14         Pupils       Pupils Dark Light Shape React APD   Right PERRL 3 3 Round Minimal None   Left PERRL 3 3 Round Minimal None         Visual Fields (Counting fingers)       Left Right    Full Full         Extraocular Movement       Right Left    Full Full         Neuro/Psych     Oriented x3: Yes   Mood/Affect: Normal         Dilation     Right eye: 1.0% Mydriacyl, 2.5% Phenylephrine @ 10:05 AM           Slit Lamp and Fundus Exam     External Exam       Right Left   External Normal Normal         Slit Lamp Exam       Right Left   Lids/Lashes Normal Normal   Conjunctiva/Sclera White and quiet White and quiet   Cornea Clear Clear   Anterior Chamber Deep and  quiet Deep and quiet   Iris Round and reactive Round and reactive   Lens Posterior chamber intraocular lens  Posterior chamber intraocular lens   Anterior Vitreous Normal Normal         Fundus Exam       Right Left   Posterior Vitreous Posterior vitreous detachment    Disc Normal    C/D Ratio 0.35    Macula Cystoid macular edema, Macular thickening, no exudates, Microaneurysms    Vessels Twig macular branch retinal vein occlusion.    Periphery Normal             IMAGING AND PROCEDURES  Imaging and Procedures for 01/14/21  OCT, Retina - OU - Both Eyes       Right Eye Quality was good. Scan locations included subfoveal. Central Foveal Thickness: 433. Progression has been stable. Findings include abnormal foveal contour, cystoid macular edema.   Left Eye Quality was good. Scan locations included subfoveal. Central Foveal Thickness: 300. Progression has improved. Findings include abnormal foveal contour, cystoid macular edema.   Notes CME OD, juxta foveal from macular twig branch retinal vein occlusion. Stable overall currently at 6-week follow-up interval, repeat injection OD today and again in 6 weeks OD, still very active OD   OS improved OS, 6week post injection Avastin, and overall stable after injection OS 6 weeks prior      Intravitreal Injection, Pharmacologic Agent - OD - Right Eye       Time Out 01/14/2021. 10:33 AM. Confirmed correct patient, procedure, site, and patient consented.   Anesthesia Topical anesthesia was used. Anesthetic medications included Akten 3.5%.   Procedure Preparation included Tobramycin 0.3%, 10% betadine to eyelids, 5% betadine to ocular surface, Ofloxacin . A 30 gauge needle was used.   Injection: 2.5 mg bevacizumab 2.5 MG/0.1ML   Route: Intravitreal, Site: Right Eye   NDC: [phone], Lot: [id number]   Post-op Post injection exam found visual acuity of at least counting fingers. The patient tolerated the procedure well. There were no complications. The patient received written and verbal post procedure care education. Post injection  medications were not given.              ASSESSMENT/PLAN:  Branch retinal vein occlusion with macular edema of right eye Very active twig BRVO with CME still active at 6-week postinjection Avastin repeat injection today to maintain visual acuity function and prevent progression  Branch retinal vein occlusion with macular edema of left eye Improved CME from macular 20 degree BRVO at 6-week interval follow-up as scheduled OS     ICD-10-CM   1. Branch retinal vein occlusion with macular edema of right eye  H34.8310 OCT, Retina - OU - Both Eyes    Intravitreal Injection, Pharmacologic Agent - OD - Right Eye    bevacizumab (AVASTIN) SOSY 2.5 mg    2. Branch retinal vein occlusion with macular edema of left eye  H34.8320       1.  BRVO OS improving and stable at 6-week postinjection  2.  BRVO OD stable acuity yet still with massive thickening in the center involved from BRVO repeat injection today and follow-up again in 6 weeks  3.  Ophthalmic Meds Ordered this visit:  Meds ordered this encounter  Medications   bevacizumab (AVASTIN) SOSY 2.5 mg       Return in about 6 weeks (around 02/25/2021) for dilate, and possible injection OS at same visit, DILATE OU, AVASTIN OCT, OD, OS.  There are no Patient Instructions on  file for this visit.   Explained the diagnoses, plan, and follow up with the patient and they expressed understanding.  Patient expressed understanding of the importance of proper follow up care.   Alford Highland Earnstine Meinders M.D. Diseases & Surgery of the Retina and Vitreous [facility] 01/14/21     Abbreviations: M myopia (nearsighted); A astigmatism; H hyperopia (farsighted); P presbyopia; Mrx spectacle prescription;  CTL contact lenses; OD right eye; OS left eye; OU both eyes  XT exotropia; ET esotropia; PEK punctate epithelial keratitis; PEE punctate epithelial erosions; DES dry eye syndrome; MGD meibomian gland dysfunction; ATs artificial tears;  PFAT's preservative free artificial tears; NSC nuclear sclerotic cataract; PSC posterior subcapsular cataract; ERM epi-retinal membrane; PVD posterior vitreous detachment; RD retinal detachment; DM diabetes mellitus; DR diabetic retinopathy; NPDR non-proliferative diabetic retinopathy; PDR proliferative diabetic retinopathy; CSME clinically significant macular edema; DME diabetic macular edema; dbh dot blot hemorrhages; CWS cotton wool spot; POAG primary open angle glaucoma; C/D cup-to-disc ratio; HVF humphrey visual field; GVF goldmann visual field; OCT optical coherence tomography; IOP intraocular pressure; BRVO Branch retinal vein occlusion; CRVO central retinal vein occlusion; CRAO central retinal artery occlusion; BRAO branch retinal artery occlusion; RT retinal tear; SB scleral buckle; PPV pars plana vitrectomy; VH Vitreous hemorrhage; PRP panretinal laser photocoagulation; IVK intravitreal kenalog; VMT vitreomacular traction; MH Macular hole;  NVD neovascularization of the disc; NVE neovascularization elsewhere; AREDS age related eye disease study; ARMD age related macular degeneration; POAG primary open angle glaucoma; EBMD epithelial/anterior basement membrane dystrophy; ACIOL anterior chamber intraocular lens; IOL intraocular lens; PCIOL posterior chamber intraocular lens; Phaco/IOL phacoemulsification with intraocular lens placement; PRK photorefractive keratectomy; LASIK laser assisted in situ keratomileusis; HTN hypertension; DM diabetes mellitus; COPD chronic obstructive pulmonary disease

## 2021-01-17 DIAGNOSIS — G4733 Obstructive sleep apnea (adult) (pediatric): Secondary | ICD-10-CM | POA: Diagnosis not present

## 2021-01-24 DIAGNOSIS — E785 Hyperlipidemia, unspecified: Secondary | ICD-10-CM | POA: Diagnosis not present

## 2021-01-24 DIAGNOSIS — Z823 Family history of stroke: Secondary | ICD-10-CM | POA: Diagnosis not present

## 2021-01-24 DIAGNOSIS — I25119 Atherosclerotic heart disease of native coronary artery with unspecified angina pectoris: Secondary | ICD-10-CM | POA: Diagnosis not present

## 2021-01-24 DIAGNOSIS — Z809 Family history of malignant neoplasm, unspecified: Secondary | ICD-10-CM | POA: Diagnosis not present

## 2021-01-24 DIAGNOSIS — I10 Essential (primary) hypertension: Secondary | ICD-10-CM | POA: Diagnosis not present

## 2021-01-24 DIAGNOSIS — M1990 Unspecified osteoarthritis, unspecified site: Secondary | ICD-10-CM | POA: Diagnosis not present

## 2021-01-24 DIAGNOSIS — Z7982 Long term (current) use of aspirin: Secondary | ICD-10-CM | POA: Diagnosis not present

## 2021-01-24 DIAGNOSIS — E039 Hypothyroidism, unspecified: Secondary | ICD-10-CM | POA: Diagnosis not present

## 2021-01-24 DIAGNOSIS — I252 Old myocardial infarction: Secondary | ICD-10-CM | POA: Diagnosis not present

## 2021-02-17 ENCOUNTER — Ambulatory Visit: Payer: Medicare PPO | Admitting: Cardiology

## 2021-02-17 DIAGNOSIS — G4733 Obstructive sleep apnea (adult) (pediatric): Secondary | ICD-10-CM | POA: Diagnosis not present

## 2021-02-25 ENCOUNTER — Ambulatory Visit (INDEPENDENT_AMBULATORY_CARE_PROVIDER_SITE_OTHER): Payer: Medicare PPO | Admitting: Ophthalmology

## 2021-02-25 ENCOUNTER — Encounter (INDEPENDENT_AMBULATORY_CARE_PROVIDER_SITE_OTHER): Payer: Medicare PPO | Admitting: Ophthalmology

## 2021-02-25 ENCOUNTER — Encounter (INDEPENDENT_AMBULATORY_CARE_PROVIDER_SITE_OTHER): Payer: Self-pay | Admitting: Ophthalmology

## 2021-02-25 ENCOUNTER — Other Ambulatory Visit: Payer: Self-pay

## 2021-02-25 DIAGNOSIS — H34833 Tributary (branch) retinal vein occlusion, bilateral, with macular edema: Secondary | ICD-10-CM | POA: Diagnosis not present

## 2021-02-25 DIAGNOSIS — H34831 Tributary (branch) retinal vein occlusion, right eye, with macular edema: Secondary | ICD-10-CM

## 2021-02-25 DIAGNOSIS — H34832 Tributary (branch) retinal vein occlusion, left eye, with macular edema: Secondary | ICD-10-CM

## 2021-02-25 MED ORDER — BEVACIZUMAB 2.5 MG/0.1ML IZ SOSY
2.5000 mg | PREFILLED_SYRINGE | INTRAVITREAL | Status: AC | PRN
  Administered 2021-02-25: 2.5 mg via INTRAVITREAL

## 2021-02-25 NOTE — Assessment & Plan Note (Signed)
Chronically active OD today at 6-week follow-up we will repeat injection today to maintain current acuity

## 2021-02-25 NOTE — Progress Notes (Signed)
02/25/2021     CHIEF COMPLAINT Patient presents for  Chief Complaint  Patient presents with   Retina Follow Up      HISTORY OF PRESENT ILLNESS: [name] is a 80 y.o. male who presents to the clinic today for:   HPI     Retina Follow Up   Patient presents with  CRVO/BRVO.  In both eyes.  This started weeks ago.  Severity is mild.  Duration of weeks.  Since onset it is stable.        Comments   6 week fu OD oct avastin OD, 12 week fu OS oct avastin OS. Patient states "I can tell my left eye is a little worse because it has been about 12 weeks since the last visit. Both of my eyes are a little worse because of the water in them. My eyes have been tearing pretty bad." Denies any new floaters or FOL.       Last edited by Nelva Nay on 02/25/2021  9:38 AM.      Referring physician: [name], MD [address] [place],  [place] [address]  HISTORICAL INFORMATION:   Selected notes from the MEDICAL RECORD NUMBER       CURRENT MEDICATIONS: No current outpatient medications on file. (Ophthalmic Drugs)   No current facility-administered medications for this visit. (Ophthalmic Drugs)   Current Outpatient Medications (Other)  Medication Sig   aspirin 81 MG tablet Take 81 mg by mouth daily.   carvedilol (COREG) 12.5 MG tablet Take 1 tablet (12.5 mg total) by mouth 2 (two) times daily.   Cholecalciferol (VITAMIN D) 1000 UNITS capsule Take 1,000 Units by mouth daily.   Coenzyme Q10 (COQ10) 100 MG CAPS Take 3 capsules by mouth daily.    Cyanocobalamin (B-12) 1000 MCG CAPS Take 1 tablet by mouth daily.    levothyroxine (SYNTHROID, LEVOTHROID) 75 MCG tablet Take 1 tablet by mouth daily.   Misc Natural Products (PROSTATE HEALTH PO) Take 1 tablet by mouth daily. Unknown strength per patient   Multiple Vitamin (MULTIVITAMIN) tablet Take 2 tablets by mouth daily.   nitroGLYCERIN (NITROSTAT) 0.4 MG SL tablet Place 1 tablet under the tongue every 5 minutes as needed.  (Patient taking differently: Place 0.4 mg under the tongue every 5 (five) minutes as needed for chest pain.)   Omega-3 Fatty Acids (FISH OIL) 1200 MG CAPS Take 1,200 mg by mouth 2 (two) times daily.    rosuvastatin (CRESTOR) 5 MG tablet Take 1 tablet by mouth daily.   tamsulosin (FLOMAX) 0.4 MG CAPS capsule Take 0.4 mg by mouth daily.    No current facility-administered medications for this visit. (Other)      REVIEW OF SYSTEMS:    ALLERGIES No Known Allergies  PAST MEDICAL HISTORY Past Medical History:  Diagnosis Date   Anxiety    ANXIETY 07/28/2010   Qualifier: Diagnosis of  By: [name] LPN, [name]     Arthritis    neck; limited neck movement   Branch retinal vein occlusion with macular edema of left eye 09/05/2019   Branch retinal vein occlusion with macular edema of right eye 09/05/2019   Chronic cholecystitis 05/13/2020   Chronic rhinitis    Coronary artery disease    Coronary artery disease involving native coronary artery of native heart 05/11/2016   Depression    no treatment since he retired   DEPRESSION 07/28/2010   Qualifier: Diagnosis of  By: [name] LPN, [name]     Dyspnea  with exertion   Essential hypertension 07/28/2010   Qualifier: Diagnosis of  By: [name] LPN, [name]     Essential tremor 09/07/2017   Gallbladder sludge 05/13/2020   GERD (gastroesophageal reflux disease)    Headache    hx subdural hematoma   Hyperlipidemia    HYPERLIPIDEMIA 07/28/2010   Qualifier: Diagnosis of  By: [name] LPN, [name]     Hypertension    Hypothyroidism    Intermediate stage nonexudative age-related macular degeneration of right eye 09/05/2019   Ischemic cardiomyopathy 05/11/2016   OBSTRUCTIVE SLEEP APNEA 07/29/2010   NPSG 2006:  AHI 18/hr. Pressure optimized to 12cm by auto device 2012     OSA (obstructive sleep apnea)    Posterior vitreous detachment of left eye 09/05/2019   Posterior vitreous detachment of right eye 09/05/2019   Postoperative examination 06/10/2020    RESTLESS LEGS SYNDROME 07/29/2010   Trial of requip 2012:  +intolerance, didn't feel it made a difference    Status post coronary artery bypass graft 05/11/2016   Overview:  2005  Formatting of this note might be different from the original. 2005   Stroke [facility]) ~43yrs ago   mild affects Lt eye   Subdural hematoma (HCC) 2004   Past Surgical History:  Procedure Laterality Date   APPENDECTOMY  1970s   BRAIN HEMATOMA EVACUATION  ~2005   subdural hematoma   CARPAL TUNNEL RELEASE Right 05/21/2016   Procedure: CARPAL TUNNEL RELEASE, right;  Surgeon: Cindee Salt, MD;  Location: [facility];  Service: Orthopedics;  Laterality: Right;  FAB   CATARACT EXTRACTION Left    CORONARY ARTERY BYPASS GRAFT  ~2006   gall bladder removed   05/2020   Heart bypass  2005   ROTATOR CUFF REPAIR Right ~4 years    FAMILY HISTORY Family History  Problem Relation Age of Onset   Heart disease Father    Leukemia Son    Stroke Mother     SOCIAL HISTORY Social History   Tobacco Use   Smoking status: Never   Smokeless tobacco: Never  Vaping Use   Vaping Use: Never used  Substance Use Topics   Alcohol use: No   Drug use: No         OPHTHALMIC EXAM:  Base Eye Exam     Visual Acuity (ETDRS)       Right Left   Dist [place] 20/20 20/25         Tonometry (Tonopen, 9:38 AM)       Right Left   Pressure 15 13         Pupils       Pupils Dark Light React APD   Right PERRL 3 3 Minimal None   Left PERRL 3 3 Minimal None         Extraocular Movement       Right Left    Full Full         Neuro/Psych     Oriented x3: Yes   Mood/Affect: Normal         Dilation     Both eyes: 1.0% Mydriacyl, 2.5% Phenylephrine @ 9:38 AM           Slit Lamp and Fundus Exam     External Exam       Right Left   External Normal Normal         Slit Lamp Exam       Right Left   Lids/Lashes Normal Normal   Conjunctiva/Sclera White and  quiet White and quiet   Cornea Clear  Clear   Anterior Chamber Deep and quiet Deep and quiet   Iris Round and reactive Round and reactive   Lens Posterior chamber intraocular lens Posterior chamber intraocular lens   Anterior Vitreous Normal Normal         Fundus Exam       Right Left   Posterior Vitreous Posterior vitreous detachment Posterior vitreous detachment   Disc Normal Pallor 1+   C/D Ratio 0.35 0.6   Macula Cystoid macular edema, Macular thickening, no exudates, Microaneurysms Microaneurysms along the inferotemporal border of the FAZ, Macular thickening, Hard drusen, no exudates, no atrophy, Cystoid macular edema   Vessels Twig macular branch retinal vein occlusion. Old macular branch retinal vein occlusion   Periphery Normal Normal            IMAGING AND PROCEDURES  Imaging and Procedures for 02/25/21  OCT, Retina - OU - Both Eyes       Right Eye Quality was good. Scan locations included subfoveal. Central Foveal Thickness: 454. Progression has been stable. Findings include abnormal foveal contour, cystoid macular edema.   Left Eye Quality was good. Scan locations included subfoveal. Central Foveal Thickness: 417. Progression has improved. Findings include abnormal foveal contour, cystoid macular edema.   Notes CME OD, juxta foveal from macular twig branch retinal vein occlusion. Stable overall currently at 6-week follow-up interval, repeat injection OD today and again in 6 weeks OD, still very active OD   OS improved OS, at prior 6 week post injection Avastin, and worse now left eye at 12 weeks postinjection OS      Intravitreal Injection, Pharmacologic Agent - OD - Right Eye       Time Out 02/25/2021. 10:13 AM. Confirmed correct patient, procedure, site, and patient consented.   Anesthesia Topical anesthesia was used. Anesthetic medications included Akten 3.5%.   Procedure Preparation included Tobramycin 0.3%, 10% betadine to eyelids, 5% betadine to ocular surface, Ofloxacin . A 30  gauge needle was used.   Injection: 2.5 mg bevacizumab 2.5 MG/0.1ML   Route: Intravitreal, Site: Right Eye   NDC: [phone], Lot: [id number]   Post-op Post injection exam found visual acuity of at least counting fingers. The patient tolerated the procedure well. There were no complications. The patient received written and verbal post procedure care education. Post injection medications included ocuflox.      Intravitreal Injection, Pharmacologic Agent - OS - Left Eye       Time Out 02/25/2021. 10:13 AM. Confirmed correct patient, procedure, site, and patient consented.   Anesthesia Topical anesthesia was used.   Procedure Preparation included Ofloxacin , 10% betadine to eyelids, 5% betadine to ocular surface. A 30 gauge needle was used.   Injection: 2.5 mg bevacizumab 2.5 MG/0.1ML   Route: Intravitreal, Site: Left Eye   NDC: [phone], Lot: [id number]   Post-op Post injection exam found visual acuity of at least counting fingers. The patient tolerated the procedure well. There were no complications. The patient received written and verbal post procedure care education. Post injection medications included ocuflox.   Notes Leiter's 0.1 cc             ASSESSMENT/PLAN:  Branch retinal vein occlusion with macular edema of right eye Chronically active OD today at 6-week follow-up we will repeat injection today to maintain current acuity  Branch retinal vein occlusion with macular edema of left eye OS previously resolved and improved at 6-week interval post injection now 12 weeks  with recurrence repeat injection today and also follow-up left eye in 6 weeks     ICD-10-CM   1. Branch retinal vein occlusion with macular edema of right eye  H34.8310 OCT, Retina - OU - Both Eyes    Intravitreal Injection, Pharmacologic Agent - OD - Right Eye    bevacizumab (AVASTIN) SOSY 2.5 mg    2. Branch retinal vein occlusion with macular edema of left eye  H34.8320 OCT, Retina - OU -  Both Eyes    Intravitreal Injection, Pharmacologic Agent - OS - Left Eye    bevacizumab (AVASTIN) SOSY 2.5 mg      1.  OD chronically active BRVO macula at 6-week interval.  Repeat injection today and maintain 6-week follow-up  2.  OS improved in the past at 6 weeks postinjection however today at 12 weeks significant recurrence of CSME will return follow-up visit in 6 weeks as well to maintain visual functioning  3.  Ophthalmic Meds Ordered this visit:  Meds ordered this encounter  Medications   bevacizumab (AVASTIN) SOSY 2.5 mg   bevacizumab (AVASTIN) SOSY 2.5 mg       Return in about 6 weeks (around 04/08/2021) for DILATE OU, AVASTIN OCT, OD, OS.  There are no Patient Instructions on file for this visit.   Explained the diagnoses, plan, and follow up with the patient and they expressed understanding.  Patient expressed understanding of the importance of proper follow up care.   Alford Highland Emeree Mahler M.D. Diseases & Surgery of the Retina and Vitreous [facility] 02/25/21     Abbreviations: M myopia (nearsighted); A astigmatism; H hyperopia (farsighted); P presbyopia; Mrx spectacle prescription;  CTL contact lenses; OD right eye; OS left eye; OU both eyes  XT exotropia; ET esotropia; PEK punctate epithelial keratitis; PEE punctate epithelial erosions; DES dry eye syndrome; MGD meibomian gland dysfunction; ATs artificial tears; PFAT's preservative free artificial tears; NSC nuclear sclerotic cataract; PSC posterior subcapsular cataract; ERM epi-retinal membrane; PVD posterior vitreous detachment; RD retinal detachment; DM diabetes mellitus; DR diabetic retinopathy; NPDR non-proliferative diabetic retinopathy; PDR proliferative diabetic retinopathy; CSME clinically significant macular edema; DME diabetic macular edema; dbh dot blot hemorrhages; CWS cotton wool spot; POAG primary open angle glaucoma; C/D cup-to-disc ratio; HVF humphrey visual field; GVF goldmann visual  field; OCT optical coherence tomography; IOP intraocular pressure; BRVO Branch retinal vein occlusion; CRVO central retinal vein occlusion; CRAO central retinal artery occlusion; BRAO branch retinal artery occlusion; RT retinal tear; SB scleral buckle; PPV pars plana vitrectomy; VH Vitreous hemorrhage; PRP panretinal laser photocoagulation; IVK intravitreal kenalog; VMT vitreomacular traction; MH Macular hole;  NVD neovascularization of the disc; NVE neovascularization elsewhere; AREDS age related eye disease study; ARMD age related macular degeneration; POAG primary open angle glaucoma; EBMD epithelial/anterior basement membrane dystrophy; ACIOL anterior chamber intraocular lens; IOL intraocular lens; PCIOL posterior chamber intraocular lens; Phaco/IOL phacoemulsification with intraocular lens placement; PRK photorefractive keratectomy; LASIK laser assisted in situ keratomileusis; HTN hypertension; DM diabetes mellitus; COPD chronic obstructive pulmonary disease

## 2021-02-25 NOTE — Assessment & Plan Note (Signed)
OS previously resolved and improved at 6-week interval post injection now 12 weeks with recurrence repeat injection today and also follow-up left eye in 6 weeks

## 2021-03-03 ENCOUNTER — Encounter (INDEPENDENT_AMBULATORY_CARE_PROVIDER_SITE_OTHER): Payer: Medicare PPO | Admitting: Ophthalmology

## 2021-03-04 ENCOUNTER — Encounter (INDEPENDENT_AMBULATORY_CARE_PROVIDER_SITE_OTHER): Payer: Medicare PPO | Admitting: Ophthalmology

## 2021-03-19 DIAGNOSIS — G4733 Obstructive sleep apnea (adult) (pediatric): Secondary | ICD-10-CM | POA: Diagnosis not present

## 2021-04-08 ENCOUNTER — Encounter (INDEPENDENT_AMBULATORY_CARE_PROVIDER_SITE_OTHER): Payer: Self-pay | Admitting: Ophthalmology

## 2021-04-08 ENCOUNTER — Encounter (INDEPENDENT_AMBULATORY_CARE_PROVIDER_SITE_OTHER): Payer: Medicare PPO | Admitting: Ophthalmology

## 2021-04-08 ENCOUNTER — Ambulatory Visit (INDEPENDENT_AMBULATORY_CARE_PROVIDER_SITE_OTHER): Payer: Medicare PPO | Admitting: Ophthalmology

## 2021-04-08 ENCOUNTER — Other Ambulatory Visit: Payer: Self-pay

## 2021-04-08 DIAGNOSIS — G4733 Obstructive sleep apnea (adult) (pediatric): Secondary | ICD-10-CM

## 2021-04-08 DIAGNOSIS — H34831 Tributary (branch) retinal vein occlusion, right eye, with macular edema: Secondary | ICD-10-CM | POA: Diagnosis not present

## 2021-04-08 DIAGNOSIS — H34832 Tributary (branch) retinal vein occlusion, left eye, with macular edema: Secondary | ICD-10-CM

## 2021-04-08 DIAGNOSIS — H34833 Tributary (branch) retinal vein occlusion, bilateral, with macular edema: Secondary | ICD-10-CM | POA: Diagnosis not present

## 2021-04-08 MED ORDER — BEVACIZUMAB 2.5 MG/0.1ML IZ SOSY
2.5000 mg | PREFILLED_SYRINGE | INTRAVITREAL | Status: AC | PRN
  Administered 2021-04-08: 2.5 mg via INTRAVITREAL

## 2021-04-08 NOTE — Assessment & Plan Note (Signed)
Patient unable to tolerate CPAP due to sinus issues, nonetheless she uses nightly oxygen

## 2021-04-08 NOTE — Assessment & Plan Note (Signed)
OS today vastly improved 6 weeks postinjection left eye for CME, will maintain 6-week interval in the left eye as well to confirm that the patient is doing well and preventing center involvement of CME OS

## 2021-04-08 NOTE — Progress Notes (Signed)
04/08/2021     CHIEF COMPLAINT Patient presents for  Chief Complaint  Patient presents with   Retina Follow Up      HISTORY OF PRESENT ILLNESS: [name] is a 80 y.o. male who presents to the clinic today for:   HPI     Retina Follow Up   Patient presents with  CRVO/BRVO.  In both eyes.  This started 6 weeks ago.  Duration of 6 weeks.  Since onset it is stable.      Last edited by Frederik Pear, COA on 04/08/2021  9:19 AM.      Referring physician: [name], MD [address] [place],  [place] [address]  HISTORICAL INFORMATION:   Selected notes from the MEDICAL RECORD NUMBER       CURRENT MEDICATIONS: No current outpatient medications on file. (Ophthalmic Drugs)   No current facility-administered medications for this visit. (Ophthalmic Drugs)   Current Outpatient Medications (Other)  Medication Sig   aspirin 81 MG tablet Take 81 mg by mouth daily.   carvedilol (COREG) 12.5 MG tablet Take 1 tablet (12.5 mg total) by mouth 2 (two) times daily.   Cholecalciferol (VITAMIN D) 1000 UNITS capsule Take 1,000 Units by mouth daily.   Coenzyme Q10 (COQ10) 100 MG CAPS Take 3 capsules by mouth daily.    Cyanocobalamin (B-12) 1000 MCG CAPS Take 1 tablet by mouth daily.    levothyroxine (SYNTHROID, LEVOTHROID) 75 MCG tablet Take 1 tablet by mouth daily.   Misc Natural Products (PROSTATE HEALTH PO) Take 1 tablet by mouth daily. Unknown strength per patient   Multiple Vitamin (MULTIVITAMIN) tablet Take 2 tablets by mouth daily.   nitroGLYCERIN (NITROSTAT) 0.4 MG SL tablet Place 1 tablet under the tongue every 5 minutes as needed. (Patient taking differently: Place 0.4 mg under the tongue every 5 (five) minutes as needed for chest pain.)   Omega-3 Fatty Acids (FISH OIL) 1200 MG CAPS Take 1,200 mg by mouth 2 (two) times daily.    rosuvastatin (CRESTOR) 5 MG tablet Take 1 tablet by mouth daily.   tamsulosin (FLOMAX) 0.4 MG CAPS capsule Take 0.4 mg by mouth daily.    No  current facility-administered medications for this visit. (Other)      REVIEW OF SYSTEMS: ROS   Negative for: Constitutional, Gastrointestinal, Neurological, Skin, Genitourinary, Musculoskeletal, HENT, Endocrine, Cardiovascular, Eyes, Respiratory, Psychiatric, Allergic/Imm, Heme/Lymph Last edited by [name], MD on 04/08/2021  9:56 AM.       ALLERGIES No Known Allergies  PAST MEDICAL HISTORY Past Medical History:  Diagnosis Date   Anxiety    ANXIETY 07/28/2010   Qualifier: Diagnosis of  By: [name] LPN, [name]     Arthritis    neck; limited neck movement   Branch retinal vein occlusion with macular edema of left eye 09/05/2019   Branch retinal vein occlusion with macular edema of right eye 09/05/2019   Chronic cholecystitis 05/13/2020   Chronic rhinitis    Coronary artery disease    Coronary artery disease involving native coronary artery of native heart 05/11/2016   Depression    no treatment since he retired   DEPRESSION 07/28/2010   Qualifier: Diagnosis of  By: [name] LPN, [name]     Dyspnea    with exertion   Essential hypertension 07/28/2010   Qualifier: Diagnosis of  By: [name] LPN, [name]     Essential tremor 09/07/2017   Gallbladder sludge 05/13/2020   GERD (gastroesophageal reflux disease)    Headache  hx subdural hematoma   Hyperlipidemia    HYPERLIPIDEMIA 07/28/2010   Qualifier: Diagnosis of  By: Thad Rangereynolds LPN, [name]     Hypertension    Hypothyroidism    Intermediate stage nonexudative age-related macular degeneration of right eye 09/05/2019   Ischemic cardiomyopathy 05/11/2016   OBSTRUCTIVE SLEEP APNEA 07/29/2010   NPSG 2006:  AHI 18/hr. Pressure optimized to 12cm by auto device 2012     OSA (obstructive sleep apnea)    Posterior vitreous detachment of left eye 09/05/2019   Posterior vitreous detachment of right eye 09/05/2019   Postoperative examination 06/10/2020   RESTLESS LEGS SYNDROME 07/29/2010   Trial of requip 2012:  +intolerance, didn't feel it made  a difference    Status post coronary artery bypass graft 05/11/2016   Overview:  2005  Formatting of this note might be different from the original. 2005   Stroke [facility](HCC) ~6920yrs ago   mild affects Lt eye   Subdural hematoma 2004   Past Surgical History:  Procedure Laterality Date   APPENDECTOMY  1970s   BRAIN HEMATOMA EVACUATION  ~2005   subdural hematoma   CARPAL TUNNEL RELEASE Right 05/21/2016   Procedure: CARPAL TUNNEL RELEASE, right;  Surgeon: Cindee SaltGary Kuzma, MD;  Location: [facility];  Service: Orthopedics;  Laterality: Right;  FAB   CATARACT EXTRACTION Left    CORONARY ARTERY BYPASS GRAFT  ~2006   gall bladder removed   05/2020   Heart bypass  2005   ROTATOR CUFF REPAIR Right ~4 years    FAMILY HISTORY Family History  Problem Relation Age of Onset   Heart disease Father    Leukemia Son    Stroke Mother     SOCIAL HISTORY Social History   Tobacco Use   Smoking status: Never   Smokeless tobacco: Never  Vaping Use   Vaping Use: Never used  Substance Use Topics   Alcohol use: No   Drug use: No         OPHTHALMIC EXAM:  Base Eye Exam     Visual Acuity (ETDRS)       Right Left   Dist [place] 20/20 20/25 +2         Tonometry (Tonopen, 9:24 AM)       Right Left   Pressure 10 12         Pupils       Pupils Dark Light Shape React APD   Right PERRL 3 2.5 Round Minimal None   Left PERRL 3 2.5 Round Minimal None         Visual Fields (Counting fingers)       Left Right    Full Full         Extraocular Movement       Right Left    Full, Ortho Full, Ortho         Neuro/Psych     Oriented x3: Yes   Mood/Affect: Normal         Dilation     Both eyes: 1.0% Mydriacyl, 2.5% Phenylephrine @ 9:24 AM           Slit Lamp and Fundus Exam     External Exam       Right Left   External Normal Normal         Slit Lamp Exam       Right Left   Lids/Lashes Normal Normal   Conjunctiva/Sclera White and quiet White and  quiet   Cornea Clear Clear  Anterior Chamber Deep and quiet Deep and quiet   Iris Round and reactive Round and reactive   Lens Posterior chamber intraocular lens Posterior chamber intraocular lens   Anterior Vitreous Normal Normal         Fundus Exam       Right Left   Posterior Vitreous Posterior vitreous detachment Posterior vitreous detachment   Disc Normal Pallor 1+   C/D Ratio 0.35 0.6   Macula Cystoid macular edema, Macular thickening, no exudates, Microaneurysms Microaneurysms along the inferotemporal border of the FAZ, Hard drusen, no exudates, no atrophy, Cystoid macular edema, no macular thickening   Vessels Twig macular branch retinal vein occlusion. Old macular branch retinal vein occlusion   Periphery Normal Normal            IMAGING AND PROCEDURES  Imaging and Procedures for 04/08/21  OCT, Retina - OU - Both Eyes       Right Eye Quality was good. Scan locations included subfoveal. Central Foveal Thickness: 444. Progression has been stable. Findings include abnormal foveal contour, cystoid macular edema.   Left Eye Quality was good. Scan locations included subfoveal. Central Foveal Thickness: 295. Progression has improved. Findings include abnormal foveal contour, cystoid macular edema.   Notes CME OD, juxta foveal from macular twig branch retinal vein occlusion. Stable overall currently at 6-week follow-up interval, repeat injection OD today and again in 6 weeks OD, still very active OD   OS improved OS, at prior 6 week post injection Avastin, and vastly improved today again at 6-week interval OS      Intravitreal Injection, Pharmacologic Agent - OD - Right Eye       Time Out 04/08/2021. 9:59 AM. Confirmed correct patient, procedure, site, and patient consented.   Anesthesia Topical anesthesia was used. Anesthetic medications included Lidocaine 4%.   Procedure Preparation included Tobramycin 0.3%, 10% betadine to eyelids, 5% betadine to ocular  surface, Ofloxacin . A 30 gauge needle was used.   Injection: 2.5 mg bevacizumab 2.5 MG/0.1ML   Route: Intravitreal, Site: Right Eye   NDC: [phone], Lot: [id number]   Post-op Post injection exam found visual acuity of at least counting fingers. The patient tolerated the procedure well. There were no complications. The patient received written and verbal post procedure care education. Post injection medications included ocuflox.      Intravitreal Injection, Pharmacologic Agent - OS - Left Eye       Time Out 04/08/2021. 9:59 AM. Confirmed correct patient, procedure, site, and patient consented.   Anesthesia Topical anesthesia was used. Anesthetic medications included Lidocaine 4%.   Procedure Preparation included Ofloxacin , 10% betadine to eyelids, 5% betadine to ocular surface. A 30 gauge needle was used.   Injection: 2.5 mg bevacizumab 2.5 MG/0.1ML   Route: Intravitreal, Site: Left Eye   NDC: [phone], Lot: [id number]   Post-op Post injection exam found visual acuity of at least counting fingers. The patient tolerated the procedure well. There were no complications. The patient received written and verbal post procedure care education. Post injection medications included ocuflox.   Notes Leiter's 0.1 cc             ASSESSMENT/PLAN:  OBSTRUCTIVE SLEEP APNEA Patient unable to tolerate CPAP due to sinus issues, nonetheless she uses nightly oxygen  Branch retinal vein occlusion with macular edema of right eye Chronic active CME perifoveal though controlled on Avastin and improved overall yet still active  Branch retinal vein occlusion with macular edema of left eye OS today vastly improved  6 weeks postinjection left eye for CME, will maintain 6-week interval in the left eye as well to confirm that the patient is doing well and preventing center involvement of CME OS     ICD-10-CM   1. Branch retinal vein occlusion with macular edema of right eye  H34.8310 OCT,  Retina - OU - Both Eyes    Intravitreal Injection, Pharmacologic Agent - OD - Right Eye    Intravitreal Injection, Pharmacologic Agent - OS - Left Eye    bevacizumab (AVASTIN) SOSY 2.5 mg    bevacizumab (AVASTIN) SOSY 2.5 mg    2. OBSTRUCTIVE SLEEP APNEA  G47.33     3. Branch retinal vein occlusion with macular edema of left eye  H34.8320       1.  Repeat intravitreal Avastin OD and OS today for twig branch retinal vein occlusions with CME, OU.  2.  OD chronic active disease yet controlled at 6-week interval, OS much improved at 6-week interval and much less CME at 6-week interval thus repeat injection today  3.  Patient continues on nightly oxygen  Ophthalmic Meds Ordered this visit:  Meds ordered this encounter  Medications   bevacizumab (AVASTIN) SOSY 2.5 mg   bevacizumab (AVASTIN) SOSY 2.5 mg       Return in about 6 weeks (around 05/20/2021) for DILATE OU, AVASTIN OCT, OD, OS.  There are no Patient Instructions on file for this visit.   Explained the diagnoses, plan, and follow up with the patient and they expressed understanding.  Patient expressed understanding of the importance of proper follow up care.   Clent Demark Kalmen Lollar M.D. Diseases & Surgery of the Retina and Vitreous [facility]ina [facility] [place] 04/08/21     Abbreviations: M myopia (nearsighted); A astigmatism; H hyperopia (farsighted); P presbyopia; Mrx spectacle prescription;  CTL contact lenses; OD right eye; OS left eye; OU both eyes  XT exotropia; ET esotropia; PEK punctate epithelial keratitis; PEE punctate epithelial erosions; DES dry eye syndrome; MGD meibomian gland dysfunction; ATs artificial tears; PFAT's preservative free artificial tears; [place] nuclear sclerotic cataract; PSC posterior subcapsular cataract; ERM epi-retinal membrane; PVD posterior vitreous detachment; RD retinal detachment; DM diabetes mellitus; DR diabetic retinopathy; NPDR non-proliferative diabetic retinopathy; PDR proliferative  diabetic retinopathy; CSME clinically significant macular edema; DME diabetic macular edema; dbh dot blot hemorrhages; CWS cotton wool spot; POAG primary open angle glaucoma; C/D cup-to-disc ratio; HVF humphrey visual field; GVF goldmann visual field; OCT optical coherence tomography; IOP intraocular pressure; BRVO Branch retinal vein occlusion; CRVO central retinal vein occlusion; CRAO central retinal artery occlusion; BRAO branch retinal artery occlusion; RT retinal tear; SB scleral buckle; PPV pars plana vitrectomy; VH Vitreous hemorrhage; PRP panretinal laser photocoagulation; IVK intravitreal kenalog; VMT vitreomacular traction; MH Macular hole;  NVD neovascularization of the disc; NVE neovascularization elsewhere; AREDS age related eye disease study; ARMD age related macular degeneration; POAG primary open angle glaucoma; EBMD epithelial/anterior basement membrane dystrophy; ACIOL anterior chamber intraocular lens; IOL intraocular lens; PCIOL posterior chamber intraocular lens; Phaco/IOL phacoemulsification with intraocular lens placement; [place] photorefractive keratectomy; LASIK laser assisted in situ keratomileusis; HTN hypertension; DM diabetes mellitus; COPD chronic obstructive pulmonary disease

## 2021-04-08 NOTE — Assessment & Plan Note (Signed)
Chronic active CME perifoveal though controlled on Avastin and improved overall yet still active

## 2021-04-11 DIAGNOSIS — L570 Actinic keratosis: Secondary | ICD-10-CM | POA: Diagnosis not present

## 2021-05-07 ENCOUNTER — Encounter: Payer: Self-pay | Admitting: Cardiology

## 2021-05-07 ENCOUNTER — Ambulatory Visit: Payer: Medicare PPO | Admitting: Cardiology

## 2021-05-07 ENCOUNTER — Other Ambulatory Visit: Payer: Self-pay

## 2021-05-07 VITALS — BP 134/72 | HR 58 | Ht 70.0 in | Wt 188.4 lb

## 2021-05-07 DIAGNOSIS — I255 Ischemic cardiomyopathy: Secondary | ICD-10-CM

## 2021-05-07 DIAGNOSIS — I10 Essential (primary) hypertension: Secondary | ICD-10-CM

## 2021-05-07 DIAGNOSIS — E039 Hypothyroidism, unspecified: Secondary | ICD-10-CM

## 2021-05-07 DIAGNOSIS — I2510 Atherosclerotic heart disease of native coronary artery without angina pectoris: Secondary | ICD-10-CM | POA: Diagnosis not present

## 2021-05-07 DIAGNOSIS — G4733 Obstructive sleep apnea (adult) (pediatric): Secondary | ICD-10-CM | POA: Diagnosis not present

## 2021-05-07 NOTE — Progress Notes (Signed)
Cardiology Office Note:    Date:  05/07/2021   ID:  Ralph DiceJerry T Shugrue, DOB 10/02/1940, MRN [id number]  PCP:  Philemon KingdomProchnau, Caroline, MD  Cardiologist:  Gypsy Balsamobert Fain Francis, MD    Referring MD: Philemon KingdomProchnau, Caroline, MD   Chief Complaint  Patient presents with   Follow-up  Doing well  History of Present Illness:    [name] is a 80 y.o. male with past medical history significant for coronary artery disease, he did have coronary bypass grafting in 2006, dyslipidemia, obstructive sleep apnea, essential hypertension, history of cardiomyopathy which was ischemic in origin but last echocardiogram showed preserved left ventricle ejection fraction. Is coming today to my office for follow-up.  Overall he is doing well.  He denies have any chest pain tightness squeezing pressure burning chest.  He still very active fix a lot of things at home  Past Medical History:  Diagnosis Date   Anxiety    ANXIETY 07/28/2010   Qualifier: Diagnosis of  By: Thad Rangereynolds LPN, [name]     Arthritis    neck; limited neck movement   Branch retinal vein occlusion with macular edema of left eye 09/05/2019   Branch retinal vein occlusion with macular edema of right eye 09/05/2019   Chronic cholecystitis 05/13/2020   Chronic rhinitis    Coronary artery disease    Coronary artery disease involving native coronary artery of native heart 05/11/2016   Depression    no treatment since he retired   DEPRESSION 07/28/2010   Qualifier: Diagnosis of  By: Thad Rangereynolds LPN, [name]     Dyspnea    with exertion   Essential hypertension 07/28/2010   Qualifier: Diagnosis of  By: Thad Rangereynolds LPN, [name]     Essential tremor 09/07/2017   Gallbladder sludge 05/13/2020   GERD (gastroesophageal reflux disease)    Headache    hx subdural hematoma   Hyperlipidemia    HYPERLIPIDEMIA 07/28/2010   Qualifier: Diagnosis of  By: Thad Rangereynolds LPN, [name]     Hypertension    Hypothyroidism    Intermediate stage nonexudative age-related macular degeneration of  right eye 09/05/2019   Ischemic cardiomyopathy 05/11/2016   OBSTRUCTIVE SLEEP APNEA 07/29/2010   NPSG 2006:  AHI 18/hr. Pressure optimized to 12cm by auto device 2012     OSA (obstructive sleep apnea)    Posterior vitreous detachment of left eye 09/05/2019   Posterior vitreous detachment of right eye 09/05/2019   Postoperative examination 06/10/2020   RESTLESS LEGS SYNDROME 07/29/2010   Trial of requip 2012:  +intolerance, didn't feel it made a difference    Status post coronary artery bypass graft 05/11/2016   Overview:  2005  Formatting of this note might be different from the original. 2005   Stroke [facility](HCC) ~8064yrs ago   mild affects Lt eye   Subdural hematoma 2004    Past Surgical History:  Procedure Laterality Date   APPENDECTOMY  1970s   BRAIN HEMATOMA EVACUATION  ~2005   subdural hematoma   CARPAL TUNNEL RELEASE Right 05/21/2016   Procedure: CARPAL TUNNEL RELEASE, right;  Surgeon: Cindee SaltGary Kuzma, MD;  Location: [facility];  Service: Orthopedics;  Laterality: Right;  FAB   CATARACT EXTRACTION Left    CORONARY ARTERY BYPASS GRAFT  ~2006   gall bladder removed   05/2020   Heart bypass  2005   ROTATOR CUFF REPAIR Right ~4 years    Current Medications: Current Meds  Medication Sig   aspirin 81 MG tablet Take 81 mg by mouth daily.  carvedilol (COREG) 12.5 MG tablet Take 1 tablet (12.5 mg total) by mouth 2 (two) times daily. (Patient taking differently: Take 6.25 mg by mouth 2 (two) times daily.)   Cholecalciferol (VITAMIN D) 1000 UNITS capsule Take 1,000 Units by mouth daily.   Coenzyme Q10 300 MG CAPS Take 1 capsule by mouth daily.   Cyanocobalamin (B-12) 1000 MCG CAPS Take 1 tablet by mouth daily.    levothyroxine (SYNTHROID, LEVOTHROID) 75 MCG tablet Take 1 tablet by mouth daily.   Misc Natural Products (PROSTATE HEALTH PO) Take 1 tablet by mouth daily. Unknown strength per patient   Multiple Vitamin (MULTIVITAMIN) tablet Take 2 tablets by mouth daily. Unknown strength    nitroGLYCERIN (NITROSTAT) 0.4 MG SL tablet Place 1 tablet under the tongue every 5 minutes as needed. (Patient taking differently: Place 0.4 mg under the tongue every 5 (five) minutes as needed for chest pain.)   Omega-3 Fatty Acids (FISH OIL) 1200 MG CAPS Take 1,200 mg by mouth 2 (two) times daily.    rosuvastatin (CRESTOR) 5 MG tablet Take 1 tablet by mouth daily.   [DISCONTINUED] Coenzyme Q10 (COQ10) 100 MG CAPS Take 3 capsules by mouth daily.      Allergies:   Patient has no known allergies.   Social History   Socioeconomic History   Marital status: Married    Spouse name: Vaughan Basta   Number of children: 1   Years of education: Not on file   Highest education level: Not on file  Occupational History    Comment: Psychologist, sport and exercise    Comment: [employer]  Tobacco Use   Smoking status: Never   Smokeless tobacco: Never  Vaping Use   Vaping Use: Never used  Substance and Sexual Activity   Alcohol use: No   Drug use: No   Sexual activity: Not on file  Other Topics Concern   Not on file  Social History Narrative   Pt works part time at [employer] and part time as a Manufacturing engineer of Radio broadcast assistant Strain: Not on Art therapist Insecurity: Not on file  Transportation Needs: Not on file  Physical Activity: Not on file  Stress: Not on file  Social Connections: Not on file     Family History: The patient's family history includes Heart disease in his father; Leukemia in his son; Stroke in his mother. ROS:   Please see the history of present illness.    All 14 point review of systems negative except as described per history of present illness  EKGs/Labs/Other Studies Reviewed:      Recent Labs: No results found for requested labs within last 8760 hours.  Recent Lipid Panel No results found for: CHOL, TRIG, HDL, CHOLHDL, VLDL, LDLCALC, LDLDIRECT  Physical Exam:    VS:  BP 134/72 (BP Location: Left Arm, Patient Position: Sitting)    Pulse (!) 58   Ht 5\' 10"  (1.778 m)   Wt 188 lb 6.4 oz (85.5 kg)   SpO2 97%   BMI 27.03 kg/m     Wt Readings from Last 3 Encounters:  05/07/21 188 lb 6.4 oz (85.5 kg)  08/21/20 191 lb (86.6 kg)  05/16/20 192 lb (87.1 kg)     GEN:  Well nourished, well developed in no acute distress HEENT: Normal NECK: No JVD; No carotid bruits LYMPHATICS: No lymphadenopathy CARDIAC: RRR, no murmurs, no rubs, no gallops RESPIRATORY:  Clear to auscultation without rales, wheezing or rhonchi  ABDOMEN: Soft, non-tender,  non-distended MUSCULOSKELETAL:  No edema; No deformity  SKIN: Warm and dry LOWER EXTREMITIES: no swelling NEUROLOGIC:  Alert and oriented x 3 PSYCHIATRIC:  Normal affect   ASSESSMENT:    1. Coronary artery disease involving native coronary artery of native heart without angina pectoris   2. Ischemic cardiomyopathy   3. Essential hypertension   4. OBSTRUCTIVE SLEEP APNEA   5. Acquired hypothyroidism    PLAN:    In order of problems listed above:  Coronary disease, doing well he denies have any chest pain tightness squeezing pressure burning chest.  He still very active doing a lot of things around his house have no difficulty doing it. History of ischemic cardiomyopathy but last echocardiogram showed preserved left ventricle ejection fraction we will continue present management I think we will repeat echocardiogram Essential hypertension: Blood pressure well controlled continue present management. Dyslipidemia I did review K PN from summer his LDL 66 HDL 37.  We will continue present medications which include Crestor. Obstructive sleep apnea is on oxygen at home at night. Hypothyroidism last TSH AC is 2.65 this is from summer of this year continue present management   Medication Adjustments/Labs and Tests Ordered: Current medicines are reviewed at length with the patient today.  Concerns regarding medicines are outlined above.  No orders of the defined types were placed in  this encounter.  Medication changes: No orders of the defined types were placed in this encounter.   Signed, Georgeanna Lea, MD, [facility] 05/07/2021 9:36 AM    [facility]

## 2021-05-07 NOTE — Patient Instructions (Signed)

## 2021-05-13 DIAGNOSIS — Z0000 Encounter for general adult medical examination without abnormal findings: Secondary | ICD-10-CM | POA: Diagnosis not present

## 2021-05-13 DIAGNOSIS — Z1331 Encounter for screening for depression: Secondary | ICD-10-CM | POA: Diagnosis not present

## 2021-05-13 DIAGNOSIS — N1832 Chronic kidney disease, stage 3b: Secondary | ICD-10-CM | POA: Diagnosis not present

## 2021-05-13 DIAGNOSIS — E039 Hypothyroidism, unspecified: Secondary | ICD-10-CM | POA: Diagnosis not present

## 2021-05-13 DIAGNOSIS — E785 Hyperlipidemia, unspecified: Secondary | ICD-10-CM | POA: Diagnosis not present

## 2021-05-13 DIAGNOSIS — Z1339 Encounter for screening examination for other mental health and behavioral disorders: Secondary | ICD-10-CM | POA: Diagnosis not present

## 2021-05-13 DIAGNOSIS — Z6830 Body mass index (BMI) 30.0-30.9, adult: Secondary | ICD-10-CM | POA: Diagnosis not present

## 2021-05-20 ENCOUNTER — Ambulatory Visit (INDEPENDENT_AMBULATORY_CARE_PROVIDER_SITE_OTHER): Payer: Medicare PPO | Admitting: Ophthalmology

## 2021-05-20 ENCOUNTER — Other Ambulatory Visit: Payer: Self-pay

## 2021-05-20 DIAGNOSIS — H34832 Tributary (branch) retinal vein occlusion, left eye, with macular edema: Secondary | ICD-10-CM

## 2021-05-20 DIAGNOSIS — H34833 Tributary (branch) retinal vein occlusion, bilateral, with macular edema: Secondary | ICD-10-CM | POA: Diagnosis not present

## 2021-05-20 DIAGNOSIS — H34831 Tributary (branch) retinal vein occlusion, right eye, with macular edema: Secondary | ICD-10-CM

## 2021-05-20 MED ORDER — BEVACIZUMAB 2.5 MG/0.1ML IZ SOSY
2.5000 mg | PREFILLED_SYRINGE | INTRAVITREAL | Status: AC | PRN
  Administered 2021-05-20: 10:00:00 2.5 mg via INTRAVITREAL

## 2021-05-20 MED ORDER — BEVACIZUMAB 2.5 MG/0.1ML IZ SOSY
2.5000 mg | PREFILLED_SYRINGE | INTRAVITREAL | Status: AC | PRN
Start: 2021-05-20 — End: 2021-05-20
  Administered 2021-05-20: 10:00:00 2.5 mg via INTRAVITREAL

## 2021-05-20 NOTE — Assessment & Plan Note (Signed)
OD with preserved acuity, and still active anatomy yet still improved at 6-week interval today post Avastin will repeat injection again today evaluate again in 6 weeks

## 2021-05-20 NOTE — Assessment & Plan Note (Signed)
Left eye with recurrences yet now stabilized in 6 weeks we will follow-up again also in 6 weeks as prior longer interval examinations were associated with recurrence of the left eye as well.  Notably left eye is more responsive to therapy with near complete resolution of CME at 6-week interval

## 2021-05-20 NOTE — Progress Notes (Signed)
05/20/2021     CHIEF COMPLAINT Patient presents for  Chief Complaint  Patient presents with   Retina Evaluation      HISTORY OF PRESENT ILLNESS: [name] is a 80 y.o. male who presents to the clinic today for:   HPI     Retina Evaluation           Response to treatment: mild improvement   MD Performed: performed the HPI with the patient and updated documentation appropriately         Comments   History of bilateral BRVO with recurrent CME, responsive to intravitreal antivegF.  Today follow-up OU in 6-week interval      Last edited by Hurman Horn, MD on 05/20/2021  9:31 AM.      Referring physician: Ernestene Kiel, MD [address] [place],  [place] [id number]  HISTORICAL INFORMATION:   Selected notes from the MEDICAL RECORD NUMBER       CURRENT MEDICATIONS: No current outpatient medications on file. (Ophthalmic Drugs)   No current facility-administered medications for this visit. (Ophthalmic Drugs)   Current Outpatient Medications (Other)  Medication Sig   aspirin 81 MG tablet Take 81 mg by mouth daily.   carvedilol (COREG) 12.5 MG tablet Take 1 tablet (12.5 mg total) by mouth 2 (two) times daily. (Patient taking differently: Take 6.25 mg by mouth 2 (two) times daily.)   Cholecalciferol (VITAMIN D) 1000 UNITS capsule Take 1,000 Units by mouth daily.   Coenzyme Q10 300 MG CAPS Take 1 capsule by mouth daily.   Cyanocobalamin (B-12) 1000 MCG CAPS Take 1 tablet by mouth daily.    levothyroxine (SYNTHROID, LEVOTHROID) 75 MCG tablet Take 1 tablet by mouth daily.   Misc Natural Products (PROSTATE HEALTH PO) Take 1 tablet by mouth daily. Unknown strength per patient   Multiple Vitamin (MULTIVITAMIN) tablet Take 2 tablets by mouth daily. Unknown strength   nitroGLYCERIN (NITROSTAT) 0.4 MG SL tablet Place 1 tablet under the tongue every 5 minutes as needed. (Patient taking differently: Place 0.4 mg under the tongue every 5 (five) minutes as needed for chest  pain.)   Omega-3 Fatty Acids (FISH OIL) 1200 MG CAPS Take 1,200 mg by mouth 2 (two) times daily.    rosuvastatin (CRESTOR) 5 MG tablet Take 1 tablet by mouth daily.   No current facility-administered medications for this visit. (Other)      REVIEW OF SYSTEMS:    ALLERGIES No Known Allergies  PAST MEDICAL HISTORY Past Medical History:  Diagnosis Date   Anxiety    ANXIETY 07/28/2010   Qualifier: Diagnosis of  By: Doy Mince LPN, [name]     Arthritis    neck; limited neck movement   Branch retinal vein occlusion with macular edema of left eye 09/05/2019   Branch retinal vein occlusion with macular edema of right eye 09/05/2019   Chronic cholecystitis 05/13/2020   Chronic rhinitis    Coronary artery disease    Coronary artery disease involving native coronary artery of native heart 05/11/2016   Depression    no treatment since he retired   [place] 07/28/2010   Qualifier: Diagnosis of  By: Doy Mince LPN, [name]     Dyspnea    with exertion   Essential hypertension 07/28/2010   Qualifier: Diagnosis of  By: Doy Mince LPN, [name]     Essential tremor 09/07/2017   Gallbladder sludge 05/13/2020   GERD (gastroesophageal reflux disease)    Headache    hx subdural hematoma   Hyperlipidemia  HYPERLIPIDEMIA 07/28/2010   Qualifier: Diagnosis of  By: Doy Mince LPN, [name]     Hypertension    Hypothyroidism    Intermediate stage nonexudative age-related macular degeneration of right eye 09/05/2019   Ischemic cardiomyopathy 05/11/2016   OBSTRUCTIVE SLEEP APNEA 07/29/2010   NPSG 2006:  AHI 18/hr. Pressure optimized to 12cm by auto device 2012     OSA (obstructive sleep apnea)    Posterior vitreous detachment of left eye 09/05/2019   Posterior vitreous detachment of right eye 09/05/2019   Postoperative examination 06/10/2020   RESTLESS LEGS SYNDROME 07/29/2010   Trial of requip 2012:  +intolerance, didn't feel it made a difference    Status post coronary artery bypass graft 05/11/2016   Overview:   2005  Formatting of this note might be different from the original. 2005   Stroke [facility]) ~47yrs ago   mild affects Lt eye   Subdural hematoma 2004   Past Surgical History:  Procedure Laterality Date   APPENDECTOMY  1970s   BRAIN HEMATOMA EVACUATION  ~2005   subdural hematoma   CARPAL TUNNEL RELEASE Right 05/21/2016   Procedure: CARPAL TUNNEL RELEASE, right;  Surgeon: Daryll Brod, MD;  Location: [place];  Service: Orthopedics;  Laterality: Right;  FAB   CATARACT EXTRACTION Left    CORONARY ARTERY BYPASS GRAFT  ~2006   gall bladder removed   05/2020   Heart bypass  2005   ROTATOR CUFF REPAIR Right ~4 years    FAMILY HISTORY Family History  Problem Relation Age of Onset   Heart disease Father    Leukemia Son    Stroke Mother     SOCIAL HISTORY Social History   Tobacco Use   Smoking status: Never   Smokeless tobacco: Never  Vaping Use   Vaping Use: Never used  Substance Use Topics   Alcohol use: No   Drug use: No         OPHTHALMIC EXAM:  Base Eye Exam     Visual Acuity (ETDRS)       Right Left   Dist [place] 20/15 -1 20/20         Tonometry (Tonopen, 10:04 AM)       Right Left   Pressure 18 15         Pachymetry (05/20/2021)       Right Left   Thickness 15 20         Pupils       Pupils APD   Right PERRL None   Left PERRL          Visual Fields       Left Right    Full Full         Extraocular Movement       Right Left    Full, Ortho Full, Ortho         Neuro/Psych     Oriented x3: Yes   Mood/Affect: Normal         Dilation     Both eyes: 1.0% Mydriacyl, 2.5% Phenylephrine @ 9:34 AM           Slit Lamp and Fundus Exam     External Exam       Right Left   External Normal Normal         Slit Lamp Exam       Right Left   Lids/Lashes Normal Normal   Conjunctiva/Sclera White and quiet White and quiet   Cornea Clear Clear   Anterior  Chamber Deep and quiet Deep and quiet   Iris Round and  reactive Round and reactive   Lens Posterior chamber intraocular lens Posterior chamber intraocular lens   Anterior Vitreous Normal Normal         Fundus Exam       Right Left   Posterior Vitreous Posterior vitreous detachment Posterior vitreous detachment   Disc Normal Pallor 1+   C/D Ratio 0.35 0.6   Macula Cystoid macular edema, Macular thickening, no exudates, Microaneurysms Microaneurysms along the inferotemporal border of the FAZ, Hard drusen, no exudates, no atrophy, Cystoid macular edema, no macular thickening   Vessels Twig macular branch retinal vein occlusion. Old macular branch retinal vein occlusion   Periphery Normal Normal            IMAGING AND PROCEDURES  Imaging and Procedures for 05/20/21  OCT, Retina - OU - Both Eyes       Right Eye Quality was good. Scan locations included subfoveal. Central Foveal Thickness: 422. Progression has been stable. Findings include abnormal foveal contour, cystoid macular edema.   Left Eye Quality was good. Scan locations included subfoveal. Central Foveal Thickness: 295. Progression has improved. Findings include abnormal foveal contour, cystoid macular edema.   Notes CME OD, juxta foveal from macular twig branch retinal vein occlusion. Stable overall currently at 6-week follow-up interval, repeat injection OD today and again in 6 weeks OD, still very active OD   OS improved OS, at prior 6 week post injection Avastin, and vastly improved today again at 6-week interval OS      Intravitreal Injection, Pharmacologic Agent - OD - Right Eye       Time Out 05/20/2021. 10:05 AM. Confirmed correct patient, procedure, site, and patient consented.   Anesthesia Topical anesthesia was used. Anesthetic medications included Lidocaine 4%.   Procedure Preparation included Tobramycin 0.3%, 10% betadine to eyelids, 5% betadine to ocular surface. A 30 gauge needle was used.   Injection: 2.5 mg bevacizumab 2.5 MG/0.1ML   Route:  Intravitreal, Site: Right Eye   NDC: [phone], Lot: [id number]   Post-op Post injection exam found visual acuity of at least counting fingers. The patient tolerated the procedure well. There were no complications. The patient received written and verbal post procedure care education. Post injection medications included ocuflox.      Intravitreal Injection, Pharmacologic Agent - OS - Left Eye       Time Out 05/20/2021. 10:05 AM. Confirmed correct patient, procedure, site, and patient consented.   Anesthesia Topical anesthesia was used. Anesthetic medications included Lidocaine 4%.   Procedure Preparation included Ofloxacin , 10% betadine to eyelids, 5% betadine to ocular surface. A 30 gauge needle was used.   Injection: 2.5 mg bevacizumab 2.5 MG/0.1ML   Route: Intravitreal, Site: Left Eye   NDC: [phone], Lot: [id number]   Post-op Post injection exam found visual acuity of at least counting fingers. The patient tolerated the procedure well. There were no complications. The patient received written and verbal post procedure care education. Post injection medications included ocuflox.   Notes Leiter's 0.1 cc             ASSESSMENT/PLAN:  Branch retinal vein occlusion with macular edema of right eye OD with preserved acuity, and still active anatomy yet still improved at 6-week interval today post Avastin will repeat injection again today evaluate again in 6 weeks  Branch retinal vein occlusion with macular edema of left eye Left eye with recurrences yet now stabilized in 6 weeks we  will follow-up again also in 6 weeks as prior longer interval examinations were associated with recurrence of the left eye as well.  Notably left eye is more responsive to therapy with near complete resolution of CME at 6-week interval     ICD-10-CM   1. Branch retinal vein occlusion with macular edema of right eye  H34.8310 OCT, Retina - OU - Both Eyes    Intravitreal Injection,  Pharmacologic Agent - OD - Right Eye    bevacizumab (AVASTIN) SOSY 2.5 mg    2. Branch retinal vein occlusion with macular edema of left eye  H34.8320 OCT, Retina - OU - Both Eyes    Intravitreal Injection, Pharmacologic Agent - OS - Left Eye    bevacizumab (AVASTIN) SOSY 2.5 mg      1.  OU, with vastly improved CME post injection Avastin.  OS particularly resolved CME from macular BRVO OU.  Preserved acuity OU 2.  Repeat intravitreal Avastin OU today to maintain acuity  3.  Ophthalmic Meds Ordered this visit:  Meds ordered this encounter  Medications   bevacizumab (AVASTIN) SOSY 2.5 mg   bevacizumab (AVASTIN) SOSY 2.5 mg       Return in about 6 weeks (around 07/01/2021) for DILATE OU, AVASTIN OCT, OD, OS.  There are no Patient Instructions on file for this visit.   Explained the diagnoses, plan, and follow up with the patient and they expressed understanding.  Patient expressed understanding of the importance of proper follow up care.   Clent Demark Lelar Farewell M.D. Diseases & Surgery of the Retina and Vitreous Retina [facility] Diabetic [place] 05/20/21     Abbreviations: M myopia (nearsighted); A astigmatism; H hyperopia (farsighted); P presbyopia; Mrx spectacle prescription;  CTL contact lenses; OD right eye; OS left eye; OU both eyes  XT exotropia; ET esotropia; PEK punctate epithelial keratitis; PEE punctate epithelial erosions; DES dry eye syndrome; MGD meibomian gland dysfunction; ATs artificial tears; PFAT's preservative free artificial tears; [place] nuclear sclerotic cataract; PSC posterior subcapsular cataract; ERM epi-retinal membrane; PVD posterior vitreous detachment; RD retinal detachment; DM diabetes mellitus; DR diabetic retinopathy; NPDR non-proliferative diabetic retinopathy; PDR proliferative diabetic retinopathy; CSME clinically significant macular edema; DME diabetic macular edema; dbh dot blot hemorrhages; CWS cotton wool spot; POAG primary open angle glaucoma; C/D  cup-to-disc ratio; HVF humphrey visual field; GVF goldmann visual field; OCT optical coherence tomography; IOP intraocular pressure; BRVO Branch retinal vein occlusion; CRVO central retinal vein occlusion; CRAO central retinal artery occlusion; BRAO branch retinal artery occlusion; RT retinal tear; SB scleral buckle; PPV pars plana vitrectomy; VH Vitreous hemorrhage; PRP panretinal laser photocoagulation; IVK intravitreal kenalog; VMT vitreomacular traction; MH Macular hole;  NVD neovascularization of the disc; NVE neovascularization elsewhere; AREDS age related eye disease study; ARMD age related macular degeneration; POAG primary open angle glaucoma; EBMD epithelial/anterior basement membrane dystrophy; ACIOL anterior chamber intraocular lens; IOL intraocular lens; PCIOL posterior chamber intraocular lens; Phaco/IOL phacoemulsification with intraocular lens placement; [place] photorefractive keratectomy; LASIK laser assisted in situ keratomileusis; HTN hypertension; DM diabetes mellitus; COPD chronic obstructive pulmonary disease

## 2021-07-01 ENCOUNTER — Encounter (INDEPENDENT_AMBULATORY_CARE_PROVIDER_SITE_OTHER): Payer: Self-pay | Admitting: Ophthalmology

## 2021-07-01 ENCOUNTER — Ambulatory Visit (INDEPENDENT_AMBULATORY_CARE_PROVIDER_SITE_OTHER): Payer: Medicare PPO | Admitting: Ophthalmology

## 2021-07-01 ENCOUNTER — Other Ambulatory Visit: Payer: Self-pay

## 2021-07-01 DIAGNOSIS — H34832 Tributary (branch) retinal vein occlusion, left eye, with macular edema: Secondary | ICD-10-CM

## 2021-07-01 DIAGNOSIS — H34831 Tributary (branch) retinal vein occlusion, right eye, with macular edema: Secondary | ICD-10-CM

## 2021-07-01 DIAGNOSIS — H34833 Tributary (branch) retinal vein occlusion, bilateral, with macular edema: Secondary | ICD-10-CM | POA: Diagnosis not present

## 2021-07-01 MED ORDER — BEVACIZUMAB 2.5 MG/0.1ML IZ SOSY
2.5000 mg | PREFILLED_SYRINGE | INTRAVITREAL | Status: AC | PRN
  Administered 2021-07-01: 2.5 mg via INTRAVITREAL

## 2021-07-01 NOTE — Assessment & Plan Note (Signed)
OS also much less active CME at 6-week interval.  Repeat injection today to maintain improvement

## 2021-07-01 NOTE — Progress Notes (Signed)
07/01/2021     CHIEF COMPLAINT Patient presents for  Chief Complaint  Patient presents with   Retina Follow Up      HISTORY OF PRESENT ILLNESS: [name] is a 81 y.o. male who presents to the clinic today for:   HPI     Retina Follow Up           Diagnosis: CRVO/BRVO   Laterality: both eyes   Onset: 6 weeks ago   Severity: mild   Duration: 6 weeks   Course: stable         Comments   6 weeks dilate OU and OCT and Avastin OU  Pt states VA OU stable since last visit. Pt denies FOL, floaters, or ocular pain OU.        Last edited by Kendra Opitz, COA on 07/01/2021  9:26 AM.      Referring physician: Ernestene Kiel, MD [address] [place],  [place] [address]  HISTORICAL INFORMATION:   Selected notes from the MEDICAL RECORD NUMBER       CURRENT MEDICATIONS: No current outpatient medications on file. (Ophthalmic Drugs)   No current facility-administered medications for this visit. (Ophthalmic Drugs)   Current Outpatient Medications (Other)  Medication Sig   aspirin 81 MG tablet Take 81 mg by mouth daily.   carvedilol (COREG) 12.5 MG tablet Take 1 tablet (12.5 mg total) by mouth 2 (two) times daily. (Patient taking differently: Take 6.25 mg by mouth 2 (two) times daily.)   Cholecalciferol (VITAMIN D) 1000 UNITS capsule Take 1,000 Units by mouth daily.   Coenzyme Q10 300 MG CAPS Take 1 capsule by mouth daily.   Cyanocobalamin (B-12) 1000 MCG CAPS Take 1 tablet by mouth daily.    levothyroxine (SYNTHROID, LEVOTHROID) 75 MCG tablet Take 1 tablet by mouth daily.   Misc Natural Products (PROSTATE HEALTH PO) Take 1 tablet by mouth daily. Unknown strength per patient   Multiple Vitamin (MULTIVITAMIN) tablet Take 2 tablets by mouth daily. Unknown strength   nitroGLYCERIN (NITROSTAT) 0.4 MG SL tablet Place 1 tablet under the tongue every 5 minutes as needed. (Patient taking differently: Place 0.4 mg under the tongue every 5 (five) minutes as needed for chest  pain.)   Omega-3 Fatty Acids (FISH OIL) 1200 MG CAPS Take 1,200 mg by mouth 2 (two) times daily.    rosuvastatin (CRESTOR) 5 MG tablet Take 1 tablet by mouth daily.   No current facility-administered medications for this visit. (Other)      REVIEW OF SYSTEMS:    ALLERGIES No Known Allergies  PAST MEDICAL HISTORY Past Medical History:  Diagnosis Date   Anxiety    ANXIETY 07/28/2010   Qualifier: Diagnosis of  By: Doy Mince LPN, [name]     Arthritis    neck; limited neck movement   Branch retinal vein occlusion with macular edema of left eye 09/05/2019   Branch retinal vein occlusion with macular edema of right eye 09/05/2019   Chronic cholecystitis 05/13/2020   Chronic rhinitis    Coronary artery disease    Coronary artery disease involving native coronary artery of native heart 05/11/2016   Depression    no treatment since he retired   DEPRESSION 07/28/2010   Qualifier: Diagnosis of  By: Doy Mince LPN, [name]     Dyspnea    with exertion   Essential hypertension 07/28/2010   Qualifier: Diagnosis of  By: Doy Mince LPN, [name]     Essential tremor 09/07/2017   Gallbladder sludge 05/13/2020  GERD (gastroesophageal reflux disease)    Headache    hx subdural hematoma   Hyperlipidemia    HYPERLIPIDEMIA 07/28/2010   Qualifier: Diagnosis of  By: Doy Mince LPN, [name]     Hypertension    Hypothyroidism    Intermediate stage nonexudative age-related macular degeneration of right eye 09/05/2019   Ischemic cardiomyopathy 05/11/2016   OBSTRUCTIVE SLEEP APNEA 07/29/2010   NPSG 2006:  AHI 18/hr. Pressure optimized to 12cm by auto device 2012     OSA (obstructive sleep apnea)    Posterior vitreous detachment of left eye 09/05/2019   Posterior vitreous detachment of right eye 09/05/2019   Postoperative examination 06/10/2020   RESTLESS LEGS SYNDROME 07/29/2010   Trial of requip 2012:  +intolerance, didn't feel it made a difference    Status post coronary artery bypass graft 05/11/2016   Overview:   2005  Formatting of this note might be different from the original. 2005   Stroke [facility]) ~68yrs ago   mild affects Lt eye   Subdural hematoma 2004   Past Surgical History:  Procedure Laterality Date   APPENDECTOMY  1970s   BRAIN HEMATOMA EVACUATION  ~2005   subdural hematoma   CARPAL TUNNEL RELEASE Right 05/21/2016   Procedure: CARPAL TUNNEL RELEASE, right;  Surgeon: Daryll Brod, MD;  Location: [facility];  Service: Orthopedics;  Laterality: Right;  FAB   CATARACT EXTRACTION Left    CORONARY ARTERY BYPASS GRAFT  ~2006   gall bladder removed   05/2020   Heart bypass  2005   ROTATOR CUFF REPAIR Right ~4 years    FAMILY HISTORY Family History  Problem Relation Age of Onset   Heart disease Father    Leukemia Son    Stroke Mother     SOCIAL HISTORY Social History   Tobacco Use   Smoking status: Never   Smokeless tobacco: Never  Vaping Use   Vaping Use: Never used  Substance Use Topics   Alcohol use: No   Drug use: No         OPHTHALMIC EXAM:  Base Eye Exam     Visual Acuity (ETDRS)       Right Left   Dist [place] 20/15 20/20         Tonometry (Tonopen, 9:31 AM)       Right Left   Pressure 14 14         Pachymetry (05/20/2021)       Right Left   Thickness 15 20         Pupils       Pupils Shape React APD   Right PERRL Round Minimal None   Left PERRL Round Minimal None         Visual Fields (Counting fingers)       Left Right    Full Full         Extraocular Movement       Right Left    Full, Ortho Full, Ortho         Neuro/Psych     Oriented x3: Yes   Mood/Affect: Normal         Dilation     Both eyes: 1.0% Mydriacyl, 2.5% Phenylephrine @ 9:30 AM           Slit Lamp and Fundus Exam     External Exam       Right Left   External Normal Normal         Slit Lamp Exam  Right Left   Lids/Lashes Normal Normal   Conjunctiva/Sclera White and quiet White and quiet   Cornea Clear Clear    Anterior Chamber Deep and quiet Deep and quiet   Iris Round and reactive Round and reactive   Lens Posterior chamber intraocular lens Posterior chamber intraocular lens   Anterior Vitreous Normal Normal         Fundus Exam       Right Left   Posterior Vitreous Posterior vitreous detachment Posterior vitreous detachment   Disc Normal Pallor 1+   C/D Ratio 0.35 0.6   Macula Cystoid macular edema, Macular thickening, no exudates, Microaneurysms Microaneurysms along the inferotemporal border of the FAZ, Hard drusen, no exudates, no atrophy, Cystoid macular edema, no macular thickening   Vessels Twig macular branch retinal vein occlusion. Old macular branch retinal vein occlusion   Periphery Normal Normal            IMAGING AND PROCEDURES  Imaging and Procedures for 07/01/21  OCT, Retina - OU - Both Eyes       Right Eye Quality was good. Scan locations included subfoveal. Central Foveal Thickness: 393. Progression has been stable. Findings include abnormal foveal contour, cystoid macular edema.   Left Eye Quality was good. Scan locations included subfoveal. Central Foveal Thickness: 288. Progression has improved. Findings include abnormal foveal contour, cystoid macular edema.   Notes CME OD, juxta foveal from macular twig branch retinal vein occlusion.  Improved overall currently at 6-week follow-up interval, repeat injection OD today and again in 6 weeks OD, still very active OD   OS improved OS, at prior 6 week post injection Avastin, and vastly improved today again at 6-week interval OS      Intravitreal Injection, Pharmacologic Agent - OD - Right Eye       Time Out 07/01/2021. 10:01 AM. Confirmed correct patient, procedure, site, and patient consented.   Anesthesia Topical anesthesia was used. Anesthetic medications included Lidocaine 4%.   Procedure Preparation included Tobramycin 0.3%, 10% betadine to eyelids, 5% betadine to ocular surface. A 30 gauge needle  was used.   Injection: 2.5 mg bevacizumab 2.5 MG/0.1ML   Route: Intravitreal, Site: Right Eye   NDC: [phone], Lot: [id number]   Post-op Post injection exam found visual acuity of at least counting fingers. The patient tolerated the procedure well. There were no complications. The patient received written and verbal post procedure care education. Post injection medications included ocuflox.      Intravitreal Injection, Pharmacologic Agent - OS - Left Eye       Time Out 07/01/2021. 10:01 AM. Confirmed correct patient, procedure, site, and patient consented.   Anesthesia Topical anesthesia was used. Anesthetic medications included Lidocaine 4%.   Procedure Preparation included Ofloxacin , 10% betadine to eyelids, 5% betadine to ocular surface. A 30 gauge needle was used.   Injection: 2.5 mg bevacizumab 2.5 MG/0.1ML   Route: Intravitreal, Site: Left Eye   NDC: [phone], Lot: [id number]   Post-op Post injection exam found visual acuity of at least counting fingers. The patient tolerated the procedure well. There were no complications. The patient received written and verbal post procedure care education. Post injection medications included ocuflox.   Notes Leiter's 0.1 cc             ASSESSMENT/PLAN:  Branch retinal vein occlusion with macular edema of right eye Still active but vastly improved perifoveal CME from BRVO at 6-week interval post Avastin OD. , Repeat injection today maintain 6-week interval  Branch  retinal vein occlusion with macular edema of left eye OS also much less active CME at 6-week interval.  Repeat injection today to maintain improvement     ICD-10-CM   1. Branch retinal vein occlusion with macular edema of right eye  H34.8310 OCT, Retina - OU - Both Eyes    Intravitreal Injection, Pharmacologic Agent - OD - Right Eye    bevacizumab (AVASTIN) SOSY 2.5 mg    2. Branch retinal vein occlusion with macular edema of left eye  H34.8320 OCT, Retina -  OU - Both Eyes    Intravitreal Injection, Pharmacologic Agent - OS - Left Eye    bevacizumab (AVASTIN) SOSY 2.5 mg      1.  Macular branch retinal vein occlusion chronically active OD, with rap-like characteristics and some degree but nonetheless still responsive to antivegF, Avastin therapy, will repeat injection today and examination right eye again in 6 weeks  2.  OS with less active CME as compared OD yet still quiet controlled at 6-week interval.  We will repeat injection intravitreal Avastin OS and follow-up again in 6 weeks  3.  Ophthalmic Meds Ordered this visit:  Meds ordered this encounter  Medications   bevacizumab (AVASTIN) SOSY 2.5 mg   bevacizumab (AVASTIN) SOSY 2.5 mg       Return in about 6 weeks (around 08/12/2021) for DILATE OU, AVASTIN OCT, OD, OS.  There are no Patient Instructions on file for this visit.   Explained the diagnoses, plan, and follow up with the patient and they expressed understanding.  Patient expressed understanding of the importance of proper follow up care.   Clent Demark Allah Reason M.D. Diseases & Surgery of the Retina and Vitreous Retina & Diabetic [place] 07/01/21     Abbreviations: M myopia (nearsighted); A astigmatism; H hyperopia (farsighted); P presbyopia; Mrx spectacle prescription;  CTL contact lenses; OD right eye; OS left eye; OU both eyes  XT exotropia; ET esotropia; PEK punctate epithelial keratitis; PEE punctate epithelial erosions; DES dry eye syndrome; MGD meibomian gland dysfunction; ATs artificial tears; PFAT's preservative free artificial tears; [place] nuclear sclerotic cataract; PSC posterior subcapsular cataract; ERM epi-retinal membrane; PVD posterior vitreous detachment; RD retinal detachment; DM diabetes mellitus; DR diabetic retinopathy; NPDR non-proliferative diabetic retinopathy; PDR proliferative diabetic retinopathy; CSME clinically significant macular edema; DME diabetic macular edema; dbh dot blot hemorrhages; CWS  cotton wool spot; POAG primary open angle glaucoma; C/D cup-to-disc ratio; HVF humphrey visual field; GVF goldmann visual field; OCT optical coherence tomography; IOP intraocular pressure; BRVO Branch retinal vein occlusion; CRVO central retinal vein occlusion; CRAO central retinal artery occlusion; BRAO branch retinal artery occlusion; RT retinal tear; SB scleral buckle; PPV pars plana vitrectomy; VH Vitreous hemorrhage; PRP panretinal laser photocoagulation; IVK intravitreal kenalog; VMT vitreomacular traction; MH Macular hole;  NVD neovascularization of the disc; NVE neovascularization elsewhere; AREDS age related eye disease study; ARMD age related macular degeneration; POAG primary open angle glaucoma; EBMD epithelial/anterior basement membrane dystrophy; ACIOL anterior chamber intraocular lens; IOL intraocular lens; PCIOL posterior chamber intraocular lens; Phaco/IOL phacoemulsification with intraocular lens placement; [place] photorefractive keratectomy; LASIK laser assisted in situ keratomileusis; HTN hypertension; DM diabetes mellitus; COPD chronic obstructive pulmonary disease

## 2021-07-01 NOTE — Assessment & Plan Note (Signed)
Still active but vastly improved perifoveal CME from BRVO at 6-week interval post Avastin OD. , Repeat injection today maintain 6-week interval

## 2021-08-12 ENCOUNTER — Ambulatory Visit (INDEPENDENT_AMBULATORY_CARE_PROVIDER_SITE_OTHER): Payer: Medicare PPO | Admitting: Ophthalmology

## 2021-08-12 ENCOUNTER — Other Ambulatory Visit: Payer: Self-pay

## 2021-08-12 DIAGNOSIS — H34832 Tributary (branch) retinal vein occlusion, left eye, with macular edema: Secondary | ICD-10-CM | POA: Diagnosis not present

## 2021-08-12 DIAGNOSIS — H34831 Tributary (branch) retinal vein occlusion, right eye, with macular edema: Secondary | ICD-10-CM | POA: Diagnosis not present

## 2021-08-12 DIAGNOSIS — H34833 Tributary (branch) retinal vein occlusion, bilateral, with macular edema: Secondary | ICD-10-CM | POA: Diagnosis not present

## 2021-08-12 MED ORDER — BEVACIZUMAB 2.5 MG/0.1ML IZ SOSY
2.5000 mg | PREFILLED_SYRINGE | INTRAVITREAL | Status: AC | PRN
  Administered 2021-08-12: 2.5 mg via INTRAVITREAL

## 2021-08-12 NOTE — Assessment & Plan Note (Signed)
The nature of branch retinal vein occlusion with macular edema was discussed.  The patient was given access to printed information.  The treatment options including continued observation looking for spontaneous resolution versus grid laser versus intravitreal Kenalog injection were discussed.  PRIMARY THERAPY CONSISTS of Anti-VEGF Therapies, AVASTIN, LUCENTIS AND EYLEA.  Their usage was discussed to assist in halting the progression of Macular Edema, in order to preserve, protect or improve acuity.  Additionally, at times, limited focal laser therapy is used in the management.  The risks and benefits of all these options were discussed with the patient.  The patient's questions were answered. 

## 2021-08-12 NOTE — Progress Notes (Signed)
? ? ?08/12/2021 ? ?  ? ?CHIEF COMPLAINT ?Patient presents for  ?Chief Complaint  ?Patient presents with  ? Branch Retinal Vein Occlusion  ? ? ? ? ?HISTORY OF PRESENT ILLNESS: ?[name] is a 81 y.o. male who presents to the clinic today for:  ? ?HPI   ?6 weeks for dilate ou, avastin oct, OD, OS. ?Pt states no recent changes in medical history and no vision changes. ?Pt denies any floaters and FOL. ? ?Last edited by Silvestre Moment on 08/12/2021  9:32 AM.  ?  ? ? ?Referring physician: ?Ernestene Kiel, MD ?[address]. ?[place],  [place] [address] ? ?HISTORICAL INFORMATION:  ? ?Selected notes from the [facility] ?  ?   ? ?CURRENT MEDICATIONS: ?No current outpatient medications on file. (Ophthalmic Drugs)  ? ?No current facility-administered medications for this visit. (Ophthalmic Drugs)  ? ?Current Outpatient Medications (Other)  ?Medication Sig  ? aspirin 81 MG tablet Take 81 mg by mouth daily.  ? carvedilol (COREG) 12.5 MG tablet Take 1 tablet (12.5 mg total) by mouth 2 (two) times daily. (Patient taking differently: Take 6.25 mg by mouth 2 (two) times daily.)  ? Cholecalciferol (VITAMIN D) 1000 UNITS capsule Take 1,000 Units by mouth daily.  ? Coenzyme Q10 300 MG CAPS Take 1 capsule by mouth daily.  ? Cyanocobalamin (B-12) 1000 MCG CAPS Take 1 tablet by mouth daily.   ? levothyroxine (SYNTHROID, LEVOTHROID) 75 MCG tablet Take 1 tablet by mouth daily.  ? Misc Natural Products (PROSTATE HEALTH PO) Take 1 tablet by mouth daily. Unknown strength per patient  ? Multiple Vitamin (MULTIVITAMIN) tablet Take 2 tablets by mouth daily. Unknown strength  ? nitroGLYCERIN (NITROSTAT) 0.4 MG SL tablet Place 1 tablet under the tongue every 5 minutes as needed. (Patient taking differently: Place 0.4 mg under the tongue every 5 (five) minutes as needed for chest pain.)  ? Omega-3 Fatty Acids (FISH OIL) 1200 MG CAPS Take 1,200 mg by mouth 2 (two) times daily.   ? rosuvastatin (CRESTOR) 5 MG tablet Take 1 tablet by mouth daily.   ? ?No current facility-administered medications for this visit. (Other)  ? ? ? ? ?REVIEW OF SYSTEMS: ?ROS   ?Negative for: Constitutional, Gastrointestinal, Neurological, Skin, Genitourinary, Musculoskeletal, HENT, Endocrine, Cardiovascular, Eyes, Respiratory, Psychiatric, Allergic/Imm, Heme/Lymph ?Last edited by Silvestre Moment on 08/12/2021  9:32 AM.  ?  ? ? ? ?ALLERGIES ?No Known Allergies ? ?PAST MEDICAL HISTORY ?Past Medical History:  ?Diagnosis Date  ? Anxiety   ? ANXIETY 07/28/2010  ? Qualifier: Diagnosis of  By: Doy Mince LPN, [name]    ? Arthritis   ? neck; limited neck movement  ? Branch retinal vein occlusion with macular edema of left eye 09/05/2019  ? Branch retinal vein occlusion with macular edema of right eye 09/05/2019  ? Chronic cholecystitis 05/13/2020  ? Chronic rhinitis   ? Coronary artery disease   ? Coronary artery disease involving native coronary artery of native heart 05/11/2016  ? Depression   ? no treatment since he retired  ? DEPRESSION 07/28/2010  ? Qualifier: Diagnosis of  By: Doy Mince LPN, [name]    ? Dyspnea   ? with exertion  ? Essential hypertension 07/28/2010  ? Qualifier: Diagnosis of  By: Doy Mince LPN, [name]    ? Essential tremor 09/07/2017  ? Gallbladder sludge 05/13/2020  ? GERD (gastroesophageal reflux disease)   ? Headache   ? hx subdural hematoma  ? Hyperlipidemia   ? HYPERLIPIDEMIA 07/28/2010  ? Qualifier:  Diagnosis of  By: Doy Mince LPN, [name]    ? Hypertension   ? Hypothyroidism   ? Intermediate stage nonexudative age-related macular degeneration of right eye 09/05/2019  ? Ischemic cardiomyopathy 05/11/2016  ? OBSTRUCTIVE SLEEP APNEA 07/29/2010  ? NPSG 2006:  AHI 18/hr. Pressure optimized to 12cm by auto device 2012    ? OSA (obstructive sleep apnea)   ? Posterior vitreous detachment of left eye 09/05/2019  ? Posterior vitreous detachment of right eye 09/05/2019  ? Postoperative examination 06/10/2020  ? RESTLESS LEGS SYNDROME 07/29/2010  ? Trial of requip 2012:  +intolerance, didn't feel it made a  difference   ? Status post coronary artery bypass graft 05/11/2016  ? Overview:  2005  Formatting of this note might be different from the original. 2005  ? Stroke [facility]) ~34yrs ago  ? mild affects Lt eye  ? Subdural hematoma 2004  ? ?Past Surgical History:  ?Procedure Laterality Date  ? APPENDECTOMY  1970s  ? BRAIN HEMATOMA EVACUATION  ~2005  ? subdural hematoma  ? CARPAL TUNNEL RELEASE Right 05/21/2016  ? Procedure: CARPAL TUNNEL RELEASE, right;  Surgeon: Daryll Brod, MD;  Location: [facility];  Service: Orthopedics;  Laterality: Right;  FAB  ? CATARACT EXTRACTION Left   ? CORONARY ARTERY BYPASS GRAFT  ~2006  ? gall bladder removed   05/2020  ? Heart bypass  2005  ? ROTATOR CUFF REPAIR Right ~4 years  ? ? ?FAMILY HISTORY ?Family History  ?Problem Relation Age of Onset  ? Heart disease Father   ? Leukemia Son   ? Stroke Mother   ? ? ?SOCIAL HISTORY ?Social History  ? ?Tobacco Use  ? Smoking status: Never  ? Smokeless tobacco: Never  ?Vaping Use  ? Vaping Use: Never used  ?Substance Use Topics  ? Alcohol use: No  ? Drug use: No  ? ?  ? ?  ? ?OPHTHALMIC EXAM: ? ?Base Eye Exam   ? ? Visual Acuity (ETDRS)   ? ?   Right Left  ? Dist [place] 20/15 20/15  ? ?  ?  ? ? Tonometry (Tonopen, 9:36 AM)   ? ?   Right Left  ? Pressure 10 9  ? ?  ?  ? ? Pachymetry (05/20/2021)   ? ?   Right Left  ? Thickness 15 20  ? ?  ?  ? ? Pupils   ? ?   Pupils Dark Light Shape React APD  ? Right PERRL 3 2 Round Brisk None  ? Left PERRL 3 2 Round Brisk None  ? ?  ?  ? ? Visual Fields   ? ?   Left Right  ?  Full Full  ? ?  ?  ? ? Extraocular Movement   ? ?   Right Left  ?  Full Full  ? ?  ?  ? ? Neuro/Psych   ? ? Oriented x3: Yes  ? Mood/Affect: Normal  ? ?  ?  ? ? Dilation   ? ? Both eyes: 1.0% Mydriacyl, 2.5% Phenylephrine @ 9:36 AM  ? ?  ?  ? ?  ? ?Slit Lamp and Fundus Exam   ? ? External Exam   ? ?   Right Left  ? External Normal Normal  ? ?  ?  ? ? Slit Lamp Exam   ? ?   Right Left  ? Lids/Lashes Normal Normal  ? Conjunctiva/Sclera  White and quiet White and quiet  ? Cornea  Clear Clear  ? Anterior Chamber Deep and quiet Deep and quiet  ? Iris Round and reactive Round and reactive  ? Lens Posterior chamber intraocular lens Posterior chamber intraocular lens  ? Anterior Vitreous Normal Normal  ? ?  ?  ? ? Fundus Exam   ? ?   Right Left  ? Posterior Vitreous Posterior vitreous detachment Posterior vitreous detachment, Central vitreous floaters  ? Disc Normal Pallor 1+  ? C/D Ratio 0.35 0.6  ? Macula Cystoid macular edema, Macular thickening, no exudates, Microaneurysms Microaneurysms along the inferotemporal border of the FAZ, Hard drusen, no exudates, no atrophy, Cystoid macular edema, no macular thickening  ? Vessels Twig macular branch retinal vein occlusion. Old macular branch retinal vein occlusion  ? Periphery Normal Normal  ? ?  ?  ? ?  ? ? ?IMAGING AND PROCEDURES  ?Imaging and Procedures for 08/12/21 ? ?OCT, Retina - OU - Both Eyes   ? ?   ?Right Eye ?Quality was good. Scan locations included subfoveal. Central Foveal Thickness: 400. Progression has been stable. Findings include abnormal foveal contour, cystoid macular edema.  ? ?Left Eye ?Quality was good. Scan locations included subfoveal. Central Foveal Thickness: 300. Progression has improved. Findings include abnormal foveal contour, cystoid macular edema.  ? ?Notes ?CME OD, juxta foveal from macular twig branch retinal vein occlusion.  Improved overall currently at 6-week follow-up interval, repeat injection OD today and again in 6 weeks OD, still very active OD, but stable ? ? ?OS improved OS, at prior 6 week post injection Avastin, and vastly improved today again at 6-week interval OS, and stable ? ? ?  ? ?Intravitreal Injection, Pharmacologic Agent - OD - Right Eye   ? ?   ?Time Out ?08/12/2021. 10:06 AM. Confirmed correct patient, procedure, site, and patient consented.  ? ?Anesthesia ?Topical anesthesia was used. Anesthetic medications included Lidocaine 4%.   ? ?Procedure ?Preparation included Tobramycin 0.3%, 10% betadine to eyelids, 5% betadine to ocular surface. A 30 gauge needle was used.  ? ?Injection: ?2.5 mg bevacizumab 2.5 MG/0.1ML ?  Route: Intravitreal, Site: Right Eye ?

## 2021-09-23 ENCOUNTER — Encounter (INDEPENDENT_AMBULATORY_CARE_PROVIDER_SITE_OTHER): Payer: Self-pay | Admitting: Ophthalmology

## 2021-09-23 ENCOUNTER — Ambulatory Visit (INDEPENDENT_AMBULATORY_CARE_PROVIDER_SITE_OTHER): Payer: Medicare PPO | Admitting: Ophthalmology

## 2021-09-23 DIAGNOSIS — H34833 Tributary (branch) retinal vein occlusion, bilateral, with macular edema: Secondary | ICD-10-CM

## 2021-09-23 DIAGNOSIS — H34832 Tributary (branch) retinal vein occlusion, left eye, with macular edema: Secondary | ICD-10-CM

## 2021-09-23 DIAGNOSIS — H34831 Tributary (branch) retinal vein occlusion, right eye, with macular edema: Secondary | ICD-10-CM

## 2021-09-23 DIAGNOSIS — G4733 Obstructive sleep apnea (adult) (pediatric): Secondary | ICD-10-CM

## 2021-09-23 MED ORDER — BEVACIZUMAB 2.5 MG/0.1ML IZ SOSY
2.5000 mg | PREFILLED_SYRINGE | INTRAVITREAL | Status: AC | PRN
  Administered 2021-09-23: 2.5 mg via INTRAVITREAL

## 2021-09-23 NOTE — Assessment & Plan Note (Signed)
Compliant with usage ?

## 2021-09-23 NOTE — Progress Notes (Signed)
? ? ?09/23/2021 ? ?  ? ?CHIEF COMPLAINT ?Patient presents for  ?Chief Complaint  ?Patient presents with  ? Branch Retinal Vein Occlusion  ? ? ? ? ?HISTORY OF PRESENT ILLNESS: ?[name] is a 81 y.o. male who presents to the clinic today for:  ? ?HPI   ?6 weeks dilate OU, Avastin OCT, OD, OS. ?Patient states vision is stable and unchanged since last visit. Denies any new floaters or FOL. ? ?Last edited by Laurin Coder on 09/23/2021  9:38 AM.  ?  ? ? ?Referring physician: ?Ernestene Kiel, MD ?[address] ?[place],  [place] [phone] ? ?HISTORICAL INFORMATION:  ? ?Selected notes from the [facility] ?  ?   ? ?CURRENT MEDICATIONS: ?No current outpatient medications on file. (Ophthalmic Drugs)  ? ?No current facility-administered medications for this visit. (Ophthalmic Drugs)  ? ?Current Outpatient Medications (Other)  ?Medication Sig  ? aspirin 81 MG tablet Take 81 mg by mouth daily.  ? carvedilol (COREG) 12.5 MG tablet Take 1 tablet (12.5 mg total) by mouth 2 (two) times daily. (Patient taking differently: Take 6.25 mg by mouth 2 (two) times daily.)  ? Cholecalciferol (VITAMIN D) 1000 UNITS capsule Take 1,000 Units by mouth daily.  ? Coenzyme Q10 300 MG CAPS Take 1 capsule by mouth daily.  ? Cyanocobalamin (B-12) 1000 MCG CAPS Take 1 tablet by mouth daily.   ? levothyroxine (SYNTHROID, LEVOTHROID) 75 MCG tablet Take 1 tablet by mouth daily.  ? Misc Natural Products (PROSTATE HEALTH PO) Take 1 tablet by mouth daily. Unknown strength per patient  ? Multiple Vitamin (MULTIVITAMIN) tablet Take 2 tablets by mouth daily. Unknown strength  ? nitroGLYCERIN (NITROSTAT) 0.4 MG SL tablet Place 1 tablet under the tongue every 5 minutes as needed. (Patient taking differently: Place 0.4 mg under the tongue every 5 (five) minutes as needed for chest pain.)  ? Omega-3 Fatty Acids (FISH OIL) 1200 MG CAPS Take 1,200 mg by mouth 2 (two) times daily.   ? rosuvastatin (CRESTOR) 5 MG tablet Take 1 tablet by mouth daily.  ? ?No  current facility-administered medications for this visit. (Other)  ? ? ? ? ?REVIEW OF SYSTEMS: ?ROS   ?Negative for: Constitutional, Gastrointestinal, Neurological, Skin, Genitourinary, Musculoskeletal, HENT, Endocrine, Cardiovascular, Eyes, Respiratory, Psychiatric, Allergic/Imm, Heme/Lymph ?Last edited by Hurman Horn, MD on 09/23/2021 10:21 AM.  ?  ? ? ? ?ALLERGIES ?No Known Allergies ? ?PAST MEDICAL HISTORY ?Past Medical History:  ?Diagnosis Date  ? Anxiety   ? ANXIETY 07/28/2010  ? Qualifier: Diagnosis of  By: Doy Mince LPN, [name]    ? Arthritis   ? neck; limited neck movement  ? Branch retinal vein occlusion with macular edema of left eye 09/05/2019  ? Branch retinal vein occlusion with macular edema of right eye 09/05/2019  ? Chronic cholecystitis 05/13/2020  ? Chronic rhinitis   ? Coronary artery disease   ? Coronary artery disease involving native coronary artery of native heart 05/11/2016  ? Depression   ? no treatment since he retired  ? DEPRESSION 07/28/2010  ? Qualifier: Diagnosis of  By: Doy Mince LPN, [name]    ? Dyspnea   ? with exertion  ? Essential hypertension 07/28/2010  ? Qualifier: Diagnosis of  By: Doy Mince LPN, [name]    ? Essential tremor 09/07/2017  ? Gallbladder sludge 05/13/2020  ? GERD (gastroesophageal reflux disease)   ? Headache   ? hx subdural hematoma  ? Hyperlipidemia   ? HYPERLIPIDEMIA 07/28/2010  ? Qualifier: Diagnosis  of  By: Doy Mince LPN, [name]    ? Hypertension   ? Hypothyroidism   ? Intermediate stage nonexudative age-related macular degeneration of right eye 09/05/2019  ? Ischemic cardiomyopathy 05/11/2016  ? OBSTRUCTIVE SLEEP APNEA 07/29/2010  ? NPSG 2006:  AHI 18/hr. Pressure optimized to 12cm by auto device 2012    ? OSA (obstructive sleep apnea)   ? Posterior vitreous detachment of left eye 09/05/2019  ? Posterior vitreous detachment of right eye 09/05/2019  ? Postoperative examination 06/10/2020  ? RESTLESS LEGS SYNDROME 07/29/2010  ? Trial of requip 2012:  +intolerance, didn't feel it made  a difference   ? Status post coronary artery bypass graft 05/11/2016  ? Overview:  2005  Formatting of this note might be different from the original. 2005  ? Stroke [facility]) ~38yrs ago  ? mild affects Lt eye  ? Subdural hematoma ([place]) 2004  ? ?Past Surgical History:  ?Procedure Laterality Date  ? APPENDECTOMY  1970s  ? BRAIN HEMATOMA EVACUATION  ~2005  ? subdural hematoma  ? CARPAL TUNNEL RELEASE Right 05/21/2016  ? Procedure: CARPAL TUNNEL RELEASE, right;  Surgeon: Daryll Brod, MD;  Location: [place];  Service: Orthopedics;  Laterality: Right;  FAB  ? CATARACT EXTRACTION Left   ? CORONARY ARTERY BYPASS GRAFT  ~2006  ? gall bladder removed   05/2020  ? Heart bypass  2005  ? ROTATOR CUFF REPAIR Right ~4 years  ? ? ?FAMILY HISTORY ?Family History  ?Problem Relation Age of Onset  ? Heart disease Father   ? Leukemia Son   ? Stroke Mother   ? ? ?SOCIAL HISTORY ?Social History  ? ?Tobacco Use  ? Smoking status: Never  ? Smokeless tobacco: Never  ?Vaping Use  ? Vaping Use: Never used  ?Substance Use Topics  ? Alcohol use: No  ? Drug use: No  ? ?  ? ?  ? ?OPHTHALMIC EXAM: ? ?Base Eye Exam   ? ? Visual Acuity (ETDRS)   ? ?   Right Left  ? Dist [place] 20/15 20/15  ? ?  ?  ? ? Tonometry (Tonopen, 9:41 AM)   ? ?   Right Left  ? Pressure 6 7  ? ?  ?  ? ? Pachymetry (05/20/2021)   ? ?   Right Left  ? Thickness 15 20  ? ?  ?  ? ? Pupils   ? ?   Pupils Dark Light APD  ? Right PERRL 3 2 None  ? Left PERRL 3 2 None  ? ?  ?  ? ? Visual Fields (Counting fingers)   ? ?   Left Right  ?  Full Full  ? ?  ?  ? ? Extraocular Movement   ? ?   Right Left  ?  Full Full  ? ?  ?  ? ? Neuro/Psych   ? ? Oriented x3: Yes  ? Mood/Affect: Normal  ? ?  ?  ? ? Dilation   ? ? Both eyes: 1.0% Mydriacyl, 2.5% Phenylephrine @ 9:41 AM  ? ?  ?  ? ?  ? ?Slit Lamp and Fundus Exam   ? ? External Exam   ? ?   Right Left  ? External Normal Normal  ? ?  ?  ? ? Slit Lamp Exam   ? ?   Right Left  ? Lids/Lashes Normal Normal  ? Conjunctiva/Sclera White and  quiet White and quiet  ? Cornea Clear Clear  ?  Anterior Chamber Deep and quiet Deep and quiet  ? Iris Round and reactive Round and reactive  ? Lens Posterior chamber intraocular lens Posterior chamber intraocular lens  ? Anterior Vitreous Normal Normal  ? ?  ?  ? ? Fundus Exam   ? ?   Right Left  ? Posterior Vitreous Posterior vitreous detachment Posterior vitreous detachment, Central vitreous floaters  ? Disc Normal Pallor 1+  ? C/D Ratio 0.35 0.6  ? Macula Cystoid macular edema, Macular thickening, no exudates, Microaneurysms Microaneurysms along the inferotemporal border of the FAZ, Hard drusen, no exudates, no atrophy, Cystoid macular edema, no macular thickening  ? Vessels Twig macular branch retinal vein occlusion. Old macular branch retinal vein occlusion  ? Periphery Normal Normal  ? ?  ?  ? ?  ? ? ?IMAGING AND PROCEDURES  ?Imaging and Procedures for 09/23/21 ? ?OCT, Retina - OU - Both Eyes   ? ?   ?Right Eye ?Quality was good. Scan locations included subfoveal. Central Foveal Thickness: 437. Progression has been stable. Findings include abnormal foveal contour, cystoid macular edema.  ? ?Left Eye ?Quality was good. Scan locations included subfoveal. Central Foveal Thickness: 303. Progression has improved. Findings include abnormal foveal contour, cystoid macular edema.  ? ?Notes ?CME OD, juxta foveal from macular twig branch retinal vein occlusion.  Improved overall currently at 6-week follow-up interval, repeat injection OD today and again in 6 weeks OD, still very active OD, but stable ? ? ?OS improved OS, at prior 6 week post injection Avastin, and vastly improved today again at 6-week interval OS, and stable ? ? ?  ? ?Intravitreal Injection, Pharmacologic Agent - OD - Right Eye   ? ?   ?Time Out ?09/23/2021. 10:26 AM. Confirmed correct patient, procedure, site, and patient consented.  ? ?Anesthesia ?Topical anesthesia was used. Anesthetic medications included Lidocaine 4%.  ? ?Procedure ?Preparation  included Tobramycin 0.3%, 10% betadine to eyelids, 5% betadine to ocular surface. A 30 gauge needle was used.  ? ?Injection: ?2.5 mg bevacizumab 2.5 MG/0.1ML ?  Route: Intravitreal, Site: Right Eye ?  ND

## 2021-09-23 NOTE — Assessment & Plan Note (Signed)
Excellent acuity and stable vision, yet still chronic active CME from macular BRVO.  Stable at 6-week interval.  Repeat injection today ?

## 2021-09-23 NOTE — Assessment & Plan Note (Signed)
Excellent acuity maintained OS as well, chronic active CME from BRVO, at 6 weeks stable.  Repeat injection today ?

## 2021-10-14 DIAGNOSIS — I129 Hypertensive chronic kidney disease with stage 1 through stage 4 chronic kidney disease, or unspecified chronic kidney disease: Secondary | ICD-10-CM | POA: Diagnosis not present

## 2021-10-14 DIAGNOSIS — M4712 Other spondylosis with myelopathy, cervical region: Secondary | ICD-10-CM | POA: Diagnosis not present

## 2021-10-14 DIAGNOSIS — N1832 Chronic kidney disease, stage 3b: Secondary | ICD-10-CM | POA: Diagnosis not present

## 2021-10-14 DIAGNOSIS — R3916 Straining to void: Secondary | ICD-10-CM | POA: Diagnosis not present

## 2021-10-14 DIAGNOSIS — E039 Hypothyroidism, unspecified: Secondary | ICD-10-CM | POA: Diagnosis not present

## 2021-10-14 DIAGNOSIS — I255 Ischemic cardiomyopathy: Secondary | ICD-10-CM | POA: Diagnosis not present

## 2021-10-14 DIAGNOSIS — N401 Enlarged prostate with lower urinary tract symptoms: Secondary | ICD-10-CM | POA: Diagnosis not present

## 2021-10-22 DIAGNOSIS — K760 Fatty (change of) liver, not elsewhere classified: Secondary | ICD-10-CM | POA: Diagnosis not present

## 2021-10-22 DIAGNOSIS — N281 Cyst of kidney, acquired: Secondary | ICD-10-CM | POA: Diagnosis not present

## 2021-10-22 DIAGNOSIS — N1832 Chronic kidney disease, stage 3b: Secondary | ICD-10-CM | POA: Diagnosis not present

## 2021-10-29 DIAGNOSIS — L570 Actinic keratosis: Secondary | ICD-10-CM | POA: Diagnosis not present

## 2021-10-29 DIAGNOSIS — L821 Other seborrheic keratosis: Secondary | ICD-10-CM | POA: Diagnosis not present

## 2021-10-29 DIAGNOSIS — D2239 Melanocytic nevi of other parts of face: Secondary | ICD-10-CM | POA: Diagnosis not present

## 2021-10-29 DIAGNOSIS — L814 Other melanin hyperpigmentation: Secondary | ICD-10-CM | POA: Diagnosis not present

## 2021-10-29 DIAGNOSIS — D225 Melanocytic nevi of trunk: Secondary | ICD-10-CM | POA: Diagnosis not present

## 2021-11-04 ENCOUNTER — Ambulatory Visit (INDEPENDENT_AMBULATORY_CARE_PROVIDER_SITE_OTHER): Payer: Medicare PPO | Admitting: Ophthalmology

## 2021-11-04 ENCOUNTER — Encounter (INDEPENDENT_AMBULATORY_CARE_PROVIDER_SITE_OTHER): Payer: Self-pay | Admitting: Ophthalmology

## 2021-11-04 DIAGNOSIS — H34833 Tributary (branch) retinal vein occlusion, bilateral, with macular edema: Secondary | ICD-10-CM | POA: Diagnosis not present

## 2021-11-04 DIAGNOSIS — H43812 Vitreous degeneration, left eye: Secondary | ICD-10-CM

## 2021-11-04 DIAGNOSIS — H34832 Tributary (branch) retinal vein occlusion, left eye, with macular edema: Secondary | ICD-10-CM

## 2021-11-04 DIAGNOSIS — H34831 Tributary (branch) retinal vein occlusion, right eye, with macular edema: Secondary | ICD-10-CM

## 2021-11-04 DIAGNOSIS — H353112 Nonexudative age-related macular degeneration, right eye, intermediate dry stage: Secondary | ICD-10-CM | POA: Diagnosis not present

## 2021-11-04 MED ORDER — BEVACIZUMAB 2.5 MG/0.1ML IZ SOSY
2.5000 mg | PREFILLED_SYRINGE | INTRAVITREAL | Status: AC | PRN
  Administered 2021-11-04: 2.5 mg via INTRAVITREAL

## 2021-11-04 NOTE — Progress Notes (Signed)
11/04/2021     CHIEF COMPLAINT Patient presents for  Chief Complaint  Patient presents with   Branch Retinal Vein Occlusion      HISTORY OF PRESENT ILLNESS: [name] is a 81 y.o. male who presents to the clinic today for:   HPI   6 weeks for DILATE OU, AVASTIN OCT, OD, OS. Pt stated vision has been stable since last visit. Pt denies new floaters and FOL.    Last edited by Angeline SlimMa, San on 11/04/2021  9:42 AM.      Referring physician: Philemon KingdomProchnau, Caroline, MD [address] [place] [id number]  HISTORICAL INFORMATION:   Selected notes from the MEDICAL RECORD NUMBER       CURRENT MEDICATIONS: No current outpatient medications on file. (Ophthalmic Drugs)   No current facility-administered medications for this visit. (Ophthalmic Drugs)   Current Outpatient Medications (Other)  Medication Sig   aspirin 81 MG tablet Take 81 mg by mouth daily.   carvedilol (COREG) 12.5 MG tablet Take 1 tablet (12.5 mg total) by mouth 2 (two) times daily. (Patient taking differently: Take 6.25 mg by mouth 2 (two) times daily.)   Cholecalciferol (VITAMIN D) 1000 UNITS capsule Take 1,000 Units by mouth daily.   Coenzyme Q10 300 MG CAPS Take 1 capsule by mouth daily.   Cyanocobalamin (B-12) 1000 MCG CAPS Take 1 tablet by mouth daily.    levothyroxine (SYNTHROID, LEVOTHROID) 75 MCG tablet Take 1 tablet by mouth daily.   Misc Natural Products (PROSTATE HEALTH PO) Take 1 tablet by mouth daily. Unknown strength per patient   Multiple Vitamin (MULTIVITAMIN) tablet Take 2 tablets by mouth daily. Unknown strength   nitroGLYCERIN (NITROSTAT) 0.4 MG SL tablet Place 1 tablet under the tongue every 5 minutes as needed. (Patient taking differently: Place 0.4 mg under the tongue every 5 (five) minutes as needed for chest pain.)   Omega-3 Fatty Acids (FISH OIL) 1200 MG CAPS Take 1,200 mg by mouth 2 (two) times daily.    rosuvastatin (CRESTOR) 5 MG tablet Take 1 tablet by mouth daily.   No current  facility-administered medications for this visit. (Other)      REVIEW OF SYSTEMS: ROS   Negative for: Constitutional, Gastrointestinal, Neurological, Skin, Genitourinary, Musculoskeletal, HENT, Endocrine, Cardiovascular, Eyes, Respiratory, Psychiatric, Allergic/Imm, Heme/Lymph Last edited by Angeline SlimMa, San on 11/04/2021  9:42 AM.       ALLERGIES No Known Allergies  PAST MEDICAL HISTORY Past Medical History:  Diagnosis Date   Anxiety    ANXIETY 07/28/2010   Qualifier: Diagnosis of  By: Thad Rangereynolds LPN, [name]     Arthritis    neck; limited neck movement   Branch retinal vein occlusion with macular edema of left eye 09/05/2019   Branch retinal vein occlusion with macular edema of right eye 09/05/2019   Chronic cholecystitis 05/13/2020   Chronic rhinitis    Coronary artery disease    Coronary artery disease involving native coronary artery of native heart 05/11/2016   Depression    no treatment since he retired   DEPRESSION 07/28/2010   Qualifier: Diagnosis of  By: Thad Rangereynolds LPN, [name]     Dyspnea    with exertion   Essential hypertension 07/28/2010   Qualifier: Diagnosis of  By: Thad Rangereynolds LPN, [name]     Essential tremor 09/07/2017   Gallbladder sludge 05/13/2020   GERD (gastroesophageal reflux disease)    Headache    hx subdural hematoma   Hyperlipidemia    HYPERLIPIDEMIA 07/28/2010   Qualifier: Diagnosis  of  By: Doy Mince LPN, [name]     Hypertension    Hypothyroidism    Intermediate stage nonexudative age-related macular degeneration of right eye 09/05/2019   Ischemic cardiomyopathy 05/11/2016   OBSTRUCTIVE SLEEP APNEA 07/29/2010   NPSG 2006:  AHI 18/hr. Pressure optimized to 12cm by auto device 2012     OSA (obstructive sleep apnea)    Posterior vitreous detachment of left eye 09/05/2019   Posterior vitreous detachment of right eye 09/05/2019   Postoperative examination 06/10/2020   RESTLESS LEGS SYNDROME 07/29/2010   Trial of requip 2012:  +intolerance, didn't feel it made a difference     Status post coronary artery bypass graft 05/11/2016   Overview:  2005  Formatting of this note might be different from the original. 2005   Stroke [facility] ~48yrs ago   mild affects Lt eye   Subdural hematoma ([place]) 2004   Past Surgical History:  Procedure Laterality Date   APPENDECTOMY  1970s   BRAIN HEMATOMA EVACUATION  ~2005   subdural hematoma   CARPAL TUNNEL RELEASE Right 05/21/2016   Procedure: CARPAL TUNNEL RELEASE, right;  Surgeon: Daryll Brod, MD;  Location: [place];  Service: Orthopedics;  Laterality: Right;  FAB   CATARACT EXTRACTION Left    CORONARY ARTERY BYPASS GRAFT  ~2006   gall bladder removed   05/2020   Heart bypass  2005   ROTATOR CUFF REPAIR Right ~4 years    FAMILY HISTORY Family History  Problem Relation Age of Onset   Heart disease Father    Leukemia Son    Stroke Mother     SOCIAL HISTORY Social History   Tobacco Use   Smoking status: Never   Smokeless tobacco: Never  Vaping Use   Vaping Use: Never used  Substance Use Topics   Alcohol use: No   Drug use: No         OPHTHALMIC EXAM:  Base Eye Exam     Visual Acuity (ETDRS)       Right Left   Dist [place] 20/15 20/20 -1         Tonometry (Tonopen, 9:45 AM)       Right Left   Pressure 13 10         Pachymetry (05/20/2021)       Right Left   Thickness 15 20         Pupils       Pupils APD   Right PERRL None   Left PERRL None         Visual Fields       Left Right    Full Full         Extraocular Movement       Right Left    Full Full         Neuro/Psych     Oriented x3: Yes   Mood/Affect: Normal         Dilation     Both eyes: 1.0% Mydriacyl, 2.5% Phenylephrine @ 9:45 AM           Slit Lamp and Fundus Exam     External Exam       Right Left   External Normal Normal         Slit Lamp Exam       Right Left   Lids/Lashes Normal Normal   Conjunctiva/Sclera White and quiet White and quiet   Cornea Clear Clear    Anterior Chamber Deep and quiet Deep and  quiet   Iris Round and reactive Round and reactive   Lens Posterior chamber intraocular lens Posterior chamber intraocular lens   Anterior Vitreous Normal Normal         Fundus Exam       Right Left   Posterior Vitreous Posterior vitreous detachment Posterior vitreous detachment, Central vitreous floaters   Disc Normal Pallor 1+   C/D Ratio 0.35 0.6   Macula Cystoid macular edema, Macular thickening, no exudates, Microaneurysms Microaneurysms along the inferotemporal border of the FAZ, Hard drusen, no exudates, no atrophy, Cystoid macular edema, no macular thickening   Vessels Twig macular branch retinal vein occlusion. Old macular branch retinal vein occlusion   Periphery Normal Normal            IMAGING AND PROCEDURES  Imaging and Procedures for 11/04/21  OCT, Retina - OU - Both Eyes       Right Eye Quality was good. Scan locations included subfoveal. Central Foveal Thickness: 440. Progression has been stable. Findings include abnormal foveal contour, cystoid macular edema.   Left Eye Quality was good. Scan locations included subfoveal. Central Foveal Thickness: 309. Progression has improved. Findings include abnormal foveal contour, cystoid macular edema.   Notes CME OD, juxta foveal from macular twig branch retinal vein occlusion.  Improved overall currently at 6-week follow-up interval, repeat injection OD today and again in 6 weeks OD, still very active OD, but stable   OS improved OS, at prior 6 week post injection Avastin, and vastly improved today again at 6-week interval OS, and stable      Intravitreal Injection, Pharmacologic Agent - OD - Right Eye       Time Out 11/04/2021. 10:42 AM. Confirmed correct patient, procedure, site, and patient consented.   Anesthesia Topical anesthesia was used. Anesthetic medications included Lidocaine 4%.   Procedure Preparation included Tobramycin 0.3%, 10% betadine to eyelids,  5% betadine to ocular surface. A 30 gauge needle was used.   Injection: 2.5 mg bevacizumab 2.5 MG/0.1ML   Route: Intravitreal, Site: Right Eye   NDC: [phone], Lot: [id number], Expiration date: 12/28/2021   Post-op Post injection exam found visual acuity of at least counting fingers. The patient tolerated the procedure well. There were no complications. The patient received written and verbal post procedure care education. Post injection medications included ocuflox.      Intravitreal Injection, Pharmacologic Agent - OS - Left Eye       Time Out 11/04/2021. 10:42 AM. Confirmed correct patient, procedure, site, and patient consented.   Anesthesia Topical anesthesia was used. Anesthetic medications included Lidocaine 4%.   Procedure Preparation included Ofloxacin , 10% betadine to eyelids, 5% betadine to ocular surface. A 30 gauge needle was used.   Injection: 2.5 mg bevacizumab 2.5 MG/0.1ML   Route: Intravitreal, Site: Left Eye   NDC: [phone], Lot: [id number], Expiration date: 12/28/2021   Post-op Post injection exam found visual acuity of at least counting fingers. The patient tolerated the procedure well. There were no complications. The patient received written and verbal post procedure care education. Post injection medications included ocuflox.   Notes Leiter's 0.1 cc             ASSESSMENT/PLAN:  Branch retinal vein occlusion with macular edema of right eye Chronic active branch retinal vein occlusion with perifoveal CME, may have some rap like type characteristics at this stage but nonetheless controlled at 6-week interval post Avastin and preserved acuity.  Repeat injection OD today to maintain good acuity  Branch retinal  vein occlusion with macular edema of left eye OS, also with chronic active CME from macular BRVO. Persistent and chronic active activity temporally controlled however at 6-week interval on Avastin.  We will repeat injection today to maintain  excellent acuity  Posterior vitreous detachment of left eye Stable OU  Intermediate stage nonexudative age-related macular degeneration of right eye No signs of geographic atrophy     ICD-10-CM   1. Branch retinal vein occlusion with macular edema of left eye  H34.8320 OCT, Retina - OU - Both Eyes    Intravitreal Injection, Pharmacologic Agent - OS - Left Eye    bevacizumab (AVASTIN) SOSY 2.5 mg    2. Branch retinal vein occlusion with macular edema of right eye  H34.8310 OCT, Retina - OU - Both Eyes    Intravitreal Injection, Pharmacologic Agent - OD - Right Eye    bevacizumab (AVASTIN) SOSY 2.5 mg    3. Posterior vitreous detachment of left eye  H43.812     4. Intermediate stage nonexudative age-related macular degeneration of right eye  H35.3112       1.  Excellent acuity maintain despite recurrence of persistent chronically  BRVO related CME perifoveal OU.  Currently at 6-week interval.  Repeat injection today and maintain evaluation in 6 weeks  2.  3.  Ophthalmic Meds Ordered this visit:  Meds ordered this encounter  Medications   bevacizumab (AVASTIN) SOSY 2.5 mg   bevacizumab (AVASTIN) SOSY 2.5 mg       Return in about 6 weeks (around 12/16/2021) for DILATE OU, AVASTIN OCT, OD, OS.  There are no Patient Instructions on file for this visit.   Explained the diagnoses, plan, and follow up with the patient and they expressed understanding.  Patient expressed understanding of the importance of proper follow up care.   Clent Demark Darnella Zeiter M.D. Diseases & Surgery of the Retina and Vitreous [facility] 11/04/21     Abbreviations: M myopia (nearsighted); A astigmatism; H hyperopia (farsighted); P presbyopia; Mrx spectacle prescription;  CTL contact lenses; OD right eye; OS left eye; OU both eyes  XT exotropia; ET esotropia; PEK punctate epithelial keratitis; PEE punctate epithelial erosions; DES dry eye syndrome; MGD meibomian gland dysfunction; ATs  artificial tears; PFAT's preservative free artificial tears; [place] nuclear sclerotic cataract; PSC posterior subcapsular cataract; ERM epi-retinal membrane; PVD posterior vitreous detachment; RD retinal detachment; DM diabetes mellitus; DR diabetic retinopathy; NPDR non-proliferative diabetic retinopathy; PDR proliferative diabetic retinopathy; CSME clinically significant macular edema; DME diabetic macular edema; dbh dot blot hemorrhages; CWS cotton wool spot; POAG primary open angle glaucoma; C/D cup-to-disc ratio; HVF humphrey visual field; GVF goldmann visual field; OCT optical coherence tomography; IOP intraocular pressure; BRVO Branch retinal vein occlusion; CRVO central retinal vein occlusion; CRAO central retinal artery occlusion; BRAO branch retinal artery occlusion; RT retinal tear; SB scleral buckle; PPV pars plana vitrectomy; VH Vitreous hemorrhage; PRP panretinal laser photocoagulation; IVK intravitreal kenalog; VMT vitreomacular traction; MH Macular hole;  NVD neovascularization of the disc; NVE neovascularization elsewhere; AREDS age related eye disease study; ARMD age related macular degeneration; POAG primary open angle glaucoma; EBMD epithelial/anterior basement membrane dystrophy; ACIOL anterior chamber intraocular lens; IOL intraocular lens; PCIOL posterior chamber intraocular lens; Phaco/IOL phacoemulsification with intraocular lens placement; [place] photorefractive keratectomy; LASIK laser assisted in situ keratomileusis; HTN hypertension; DM diabetes mellitus; COPD chronic obstructive pulmonary disease

## 2021-11-04 NOTE — Assessment & Plan Note (Signed)
OS, also with chronic active CME from macular BRVO. Persistent and chronic active activity temporally controlled however at 6-week interval on Avastin.  We will repeat injection today to maintain excellent acuity

## 2021-11-04 NOTE — Assessment & Plan Note (Signed)
No signs of geographic atrophy

## 2021-11-04 NOTE — Assessment & Plan Note (Signed)
Stable OU 

## 2021-11-04 NOTE — Assessment & Plan Note (Signed)
Chronic active branch retinal vein occlusion with perifoveal CME, may have some rap like type characteristics at this stage but nonetheless controlled at 6-week interval post Avastin and preserved acuity.  Repeat injection OD today to maintain good acuity

## 2021-11-12 ENCOUNTER — Ambulatory Visit: Payer: Medicare PPO | Admitting: Cardiology

## 2021-11-13 DIAGNOSIS — M47812 Spondylosis without myelopathy or radiculopathy, cervical region: Secondary | ICD-10-CM | POA: Diagnosis not present

## 2021-11-13 DIAGNOSIS — N1832 Chronic kidney disease, stage 3b: Secondary | ICD-10-CM | POA: Diagnosis not present

## 2021-11-13 DIAGNOSIS — I25119 Atherosclerotic heart disease of native coronary artery with unspecified angina pectoris: Secondary | ICD-10-CM | POA: Diagnosis not present

## 2021-11-13 DIAGNOSIS — E785 Hyperlipidemia, unspecified: Secondary | ICD-10-CM | POA: Diagnosis not present

## 2021-11-13 DIAGNOSIS — M6281 Muscle weakness (generalized): Secondary | ICD-10-CM | POA: Diagnosis not present

## 2021-11-13 DIAGNOSIS — E039 Hypothyroidism, unspecified: Secondary | ICD-10-CM | POA: Diagnosis not present

## 2021-11-13 DIAGNOSIS — Z79899 Other long term (current) drug therapy: Secondary | ICD-10-CM | POA: Diagnosis not present

## 2021-11-13 DIAGNOSIS — I10 Essential (primary) hypertension: Secondary | ICD-10-CM | POA: Diagnosis not present

## 2021-11-13 DIAGNOSIS — R972 Elevated prostate specific antigen [PSA]: Secondary | ICD-10-CM | POA: Diagnosis not present

## 2021-12-05 DIAGNOSIS — M47812 Spondylosis without myelopathy or radiculopathy, cervical region: Secondary | ICD-10-CM | POA: Diagnosis not present

## 2021-12-05 DIAGNOSIS — M5124 Other intervertebral disc displacement, thoracic region: Secondary | ICD-10-CM | POA: Diagnosis not present

## 2021-12-05 DIAGNOSIS — M4804 Spinal stenosis, thoracic region: Secondary | ICD-10-CM | POA: Diagnosis not present

## 2021-12-16 ENCOUNTER — Encounter (INDEPENDENT_AMBULATORY_CARE_PROVIDER_SITE_OTHER): Payer: Medicare PPO | Admitting: Ophthalmology

## 2021-12-16 ENCOUNTER — Ambulatory Visit (INDEPENDENT_AMBULATORY_CARE_PROVIDER_SITE_OTHER): Payer: Medicare PPO | Admitting: Ophthalmology

## 2021-12-16 ENCOUNTER — Encounter (INDEPENDENT_AMBULATORY_CARE_PROVIDER_SITE_OTHER): Payer: Self-pay | Admitting: Ophthalmology

## 2021-12-16 DIAGNOSIS — H353112 Nonexudative age-related macular degeneration, right eye, intermediate dry stage: Secondary | ICD-10-CM

## 2021-12-16 DIAGNOSIS — H34831 Tributary (branch) retinal vein occlusion, right eye, with macular edema: Secondary | ICD-10-CM | POA: Diagnosis not present

## 2021-12-16 DIAGNOSIS — H34833 Tributary (branch) retinal vein occlusion, bilateral, with macular edema: Secondary | ICD-10-CM

## 2021-12-16 DIAGNOSIS — H34832 Tributary (branch) retinal vein occlusion, left eye, with macular edema: Secondary | ICD-10-CM | POA: Diagnosis not present

## 2021-12-16 DIAGNOSIS — H43811 Vitreous degeneration, right eye: Secondary | ICD-10-CM | POA: Diagnosis not present

## 2021-12-16 MED ORDER — BEVACIZUMAB 2.5 MG/0.1ML IZ SOSY
2.5000 mg | PREFILLED_SYRINGE | INTRAVITREAL | Status: AC | PRN
  Administered 2021-12-16: 2.5 mg via INTRAVITREAL

## 2021-12-16 NOTE — Assessment & Plan Note (Signed)

## 2021-12-16 NOTE — Assessment & Plan Note (Signed)
The nature of branch retinal vein occlusion with macular edema was discussed.  The patient was given access to printed information.  The treatment options including continued observation looking for spontaneous resolution versus grid laser versus intravitreal Kenalog injection were discussed.  PRIMARY THERAPY CONSISTS of Anti-VEGF Therapies, AVASTIN, LUCENTIS AND EYLEA.  Their usage was discussed to assist in halting the progression of Macular Edema, in order to preserve, protect or improve acuity.  Additionally, at times, limited focal laser therapy is used in the management.  The risks and benefits of all these options were discussed with the patient.  The patient's questions were answered. 

## 2021-12-16 NOTE — Progress Notes (Addendum)
12/16/2021     CHIEF COMPLAINT Patient presents for  Chief Complaint  Patient presents with   Retina Evaluation   Branch Retinal Vein Occlusion      HISTORY OF PRESENT ILLNESS: [name] is a 81 y.o. male who presents to the clinic today for:   HPI     Retina Evaluation           Laterality: both eyes   Context: distance vision         Comments   6 weeks dilate OU Avastin OD,OS OCT  Pt states his vision has been stable  Pt denies any new floaters or FOL       Last edited by [name], MD on 12/16/2021  9:59 AM.      Referring physician: [name], MD [address] [place],  [place] [address]  HISTORICAL INFORMATION:   Selected notes from the MEDICAL RECORD NUMBER       CURRENT MEDICATIONS: No current outpatient medications on file. (Ophthalmic Drugs)   No current facility-administered medications for this visit. (Ophthalmic Drugs)   Current Outpatient Medications (Other)  Medication Sig   aspirin 81 MG tablet Take 81 mg by mouth daily.   carvedilol (COREG) 12.5 MG tablet Take 1 tablet (12.5 mg total) by mouth 2 (two) times daily. (Patient taking differently: Take 6.25 mg by mouth 2 (two) times daily.)   Cholecalciferol (VITAMIN D) 1000 UNITS capsule Take 1,000 Units by mouth daily.   Coenzyme Q10 300 MG CAPS Take 1 capsule by mouth daily.   Cyanocobalamin (B-12) 1000 MCG CAPS Take 1 tablet by mouth daily.    levothyroxine (SYNTHROID, LEVOTHROID) 75 MCG tablet Take 1 tablet by mouth daily.   Misc Natural Products (PROSTATE HEALTH PO) Take 1 tablet by mouth daily. Unknown strength per patient   Multiple Vitamin (MULTIVITAMIN) tablet Take 2 tablets by mouth daily. Unknown strength   nitroGLYCERIN (NITROSTAT) 0.4 MG SL tablet Place 1 tablet under the tongue every 5 minutes as needed. (Patient taking differently: Place 0.4 mg under the tongue every 5 (five) minutes as needed for chest pain.)   Omega-3 Fatty Acids (FISH OIL) 1200 MG CAPS Take  1,200 mg by mouth 2 (two) times daily.    rosuvastatin (CRESTOR) 5 MG tablet Take 1 tablet by mouth daily.   No current facility-administered medications for this visit. (Other)      REVIEW OF SYSTEMS: ROS   Negative for: Constitutional, Gastrointestinal, Neurological, Skin, Genitourinary, Musculoskeletal, HENT, Endocrine, Cardiovascular, Eyes, Respiratory, Psychiatric, Allergic/Imm, Heme/Lymph Last edited by Erling Cruz D, CMA on 12/16/2021  9:27 AM.       ALLERGIES No Known Allergies  PAST MEDICAL HISTORY Past Medical History:  Diagnosis Date   Anxiety    ANXIETY 07/28/2010   Qualifier: Diagnosis of  By: [name] LPN, [name]     Arthritis    neck; limited neck movement   Branch retinal vein occlusion with macular edema of left eye 09/05/2019   Branch retinal vein occlusion with macular edema of right eye 09/05/2019   Chronic cholecystitis 05/13/2020   Chronic rhinitis    Coronary artery disease    Coronary artery disease involving native coronary artery of native heart 05/11/2016   Depression    no treatment since he retired   DEPRESSION 07/28/2010   Qualifier: Diagnosis of  By: [name] LPN, [name]     Dyspnea    with exertion   Essential hypertension 07/28/2010   Qualifier: Diagnosis of  By:  Reynolds LPN, [employer]     Essential tremor 09/07/2017   Gallbladder sludge 05/13/2020   GERD (gastroesophageal reflux disease)    Headache    hx subdural hematoma   Hyperlipidemia    HYPERLIPIDEMIA 07/28/2010   Qualifier: Diagnosis of  By: Doy Mince LPN, [name]     Hypertension    Hypothyroidism    Intermediate stage nonexudative age-related macular degeneration of right eye 09/05/2019   Ischemic cardiomyopathy 05/11/2016   OBSTRUCTIVE SLEEP APNEA 07/29/2010   NPSG 2006:  AHI 18/hr. Pressure optimized to 12cm by auto device 2012     OSA (obstructive sleep apnea)    Posterior vitreous detachment of left eye 09/05/2019   Posterior vitreous detachment of right eye 09/05/2019   Postoperative  examination 06/10/2020   RESTLESS LEGS SYNDROME 07/29/2010   Trial of requip 2012:  +intolerance, didn't feel it made a difference    Status post coronary artery bypass graft 05/11/2016   Overview:  2005  Formatting of this note might be different from the original. 2005   Stroke [facility]) ~64yrs ago   mild affects Lt eye   Subdural hematoma ([place]) 2004   Past Surgical History:  Procedure Laterality Date   APPENDECTOMY  1970s   BRAIN HEMATOMA EVACUATION  ~2005   subdural hematoma   CARPAL TUNNEL RELEASE Right 05/21/2016   Procedure: CARPAL TUNNEL RELEASE, right;  Surgeon: Daryll Brod, MD;  Location: [place];  Service: Orthopedics;  Laterality: Right;  FAB   CATARACT EXTRACTION Left    CORONARY ARTERY BYPASS GRAFT  ~2006   gall bladder removed   05/2020   Heart bypass  2005   ROTATOR CUFF REPAIR Right ~4 years    FAMILY HISTORY Family History  Problem Relation Age of Onset   Heart disease Father    Leukemia Son    Stroke Mother     SOCIAL HISTORY Social History   Tobacco Use   Smoking status: Never   Smokeless tobacco: Never  Vaping Use   Vaping Use: Never used  Substance Use Topics   Alcohol use: No   Drug use: No         OPHTHALMIC EXAM:  Base Eye Exam     Visual Acuity (ETDRS)       Right Left   Dist [place] 20/15 20/15         Tonometry (Tonopen, 9:33 AM)       Right Left   Pressure 15 11         Pachymetry (05/20/2021)       Right Left   Thickness 15 20         Pupils       Pupils Shape   Right PERRL Round   Left PERRL          Visual Fields       Left Right    Full Full         Neuro/Psych     Oriented x3: Yes   Mood/Affect: Normal         Dilation     Both eyes: 1.0% Mydriacyl, 2.5% Phenylephrine @ 9:27 AM           Slit Lamp and Fundus Exam     External Exam       Right Left   External Normal Normal         Slit Lamp Exam       Right Left   Lids/Lashes Normal Normal    Conjunctiva/Sclera White  and quiet White and quiet   Cornea Clear Clear   Anterior Chamber Deep and quiet Deep and quiet   Iris Round and reactive Round and reactive   Lens Posterior chamber intraocular lens Posterior chamber intraocular lens   Anterior Vitreous Normal Normal         Fundus Exam       Right Left   Posterior Vitreous Posterior vitreous detachment Posterior vitreous detachment, Central vitreous floaters   Disc Normal Pallor 1+   C/D Ratio 0.35 0.6   Macula Cystoid macular edema, Macular thickening, no exudates, Microaneurysms Microaneurysms along the inferotemporal border of the FAZ, Hard drusen, no exudates, no atrophy, Cystoid macular edema, no macular thickening   Vessels Twig macular branch retinal vein occlusion. Old macular branch retinal vein occlusion   Periphery Normal Normal            IMAGING AND PROCEDURES  Imaging and Procedures for 12/16/21  OCT, Retina - OU - Both Eyes       Right Eye Quality was good. Scan locations included subfoveal. Central Foveal Thickness: 403. Progression has improved. Findings include abnormal foveal contour, cystoid macular edema.   Left Eye Quality was good. Scan locations included subfoveal. Central Foveal Thickness: 307. Progression has improved. Findings include abnormal foveal contour, cystoid macular edema.   Notes CME OD, juxta foveal from macular twig branch retinal vein occlusion.  Improved overall currently at 6-week follow-up interval, repeat injection OD today and again in 6 weeks OD, still very active OD, but stable   OS improved OS, at prior 6 week post injection Avastin, and vastly improved today again at 6-week interval OS, and stable       Intravitreal Injection, Pharmacologic Agent - OD - Right Eye       Time Out 12/16/2021. 10:05 AM. Confirmed correct patient, procedure, site, and patient consented.   Anesthesia Topical anesthesia was used. Anesthetic medications included Lidocaine 4%.    Procedure Preparation included 5% betadine to ocular surface, 10% betadine to eyelids, Tobramycin 0.3%. A 30 gauge needle was used.   Injection: 2.5 mg bevacizumab 2.5 MG/0.1ML   Route: Intravitreal, Site: Right Eye   NDC: [phone], Lot: [id number], Expiration date: 02/04/2022, Waste: 0 mL   Post-op Post injection exam found visual acuity of at least counting fingers. The patient tolerated the procedure well. There were no complications. The patient received written and verbal post procedure care education. Post injection medications included ocuflox.      Intravitreal Injection, Pharmacologic Agent - OS - Left Eye       Time Out 12/16/2021. 10:05 AM. Confirmed correct patient, procedure, site, and patient consented.   Anesthesia Topical anesthesia was used. Anesthetic medications included Lidocaine 4%.   Procedure Preparation included 5% betadine to ocular surface, 10% betadine to eyelids, Ofloxacin . A 30 gauge needle was used.   Injection: 2.5 mg bevacizumab 2.5 MG/0.1ML   Route: Intravitreal, Site: Left Eye   NDC: [phone], Lot: [id number], Expiration date: 02/08/2022, Waste: 0 mL   Post-op Post injection exam found visual acuity of at least counting fingers. The patient tolerated the procedure well. There were no complications. The patient received written and verbal post procedure care education. Post injection medications included ocuflox.   Notes Leiter's 0.1 cc              ASSESSMENT/PLAN:  Branch retinal vein occlusion with macular edema of left eye The nature of branch retinal vein occlusion with macular edema was discussed.  The patient was  given access to printed information.  The treatment options including continued observation looking for spontaneous resolution versus grid laser versus intravitreal Kenalog injection were discussed.  PRIMARY THERAPY CONSISTS of Anti-VEGF Therapies, AVASTIN, LUCENTIS AND EYLEA.  Their usage was discussed to assist in  halting the progression of Macular Edema, in order to preserve, protect or improve acuity.  Additionally, at times, limited focal laser therapy is used in the management.  The risks and benefits of all these options were discussed with the patient.  The patient's questions were answered.  Intermediate stage nonexudative age-related macular degeneration of right eye The nature of age--related macular degeneration was discussed with the patient as well as the distinction between dry and wet types. Checking an Amsler Grid daily with advice to return immediately should a distortion develop, was given to the patient. The patient 's smoking status now and in the past was determined and advice based on the AREDS study was provided regarding the consumption of antioxidant supplements. AREDS 2 vitamin formulation was recommended. Consumption of dark leafy vegetables and fresh fruits of various colors was recommended. Treatment modalities for wet macular degeneration particularly the use of intravitreal injections of anti-blood vessel growth factors was discussed with the patient. Avastin, Lucentis, and Eylea are the available options. On occasion, therapy includes the use of photodynamic therapy and thermal laser. Stressed to the patient do not rub eyes.  Patient was advised to check Amsler Grid daily and return immediately if changes are noted. Instructions on using the grid were given to the patient. All patient questions were answered.  Branch retinal vein occlusion with macular edema of right eye The nature of branch retinal vein occlusion with macular edema was discussed.  The patient was given access to printed information.  The treatment options including continued observation looking for spontaneous resolution versus grid laser versus intravitreal Kenalog injection were discussed.  PRIMARY THERAPY CONSISTS of Anti-VEGF Therapies, AVASTIN, LUCENTIS AND EYLEA.  Their usage was discussed to assist in halting the  progression of Macular Edema, in order to preserve, protect or improve acuity.  Additionally, at times, limited focal laser therapy is used in the management.  The risks and benefits of all these options were discussed with the patient.  The patient's questions were answered.  Posterior vitreous detachment of right eye  The nature of posterior vitreous detachment was discussed with the patient as well as its physiology, its age prevalence, and its possible implication regarding retinal breaks and detachment.  An informational brochure was offered to the patient.  All the patient's questions were answered.  The patient was asked to return if new or different flashes or floaters develops.   Patient was instructed to contact office immediately if any new changes were noticed. I explained to the patient that vitreous inside the eye is similar to jello inside a bowl. As the jello melts it can start to pull away from the bowl, similarly the vitreous throughout our lives can begin to pull away from the retina. That process is called a posterior vitreous detachment. In some cases, the vitreous can tug hard enough on the retina to form a retinal tear. I discussed with the patient the signs and symptoms of a retinal detachment.  Do not rub the eye.       ICD-10-CM   1. Branch retinal vein occlusion with macular edema of right eye  H34.8310 OCT, Retina - OU - Both Eyes    Intravitreal Injection, Pharmacologic Agent - OD - Right Eye  bevacizumab (AVASTIN) SOSY 2.5 mg    2. Branch retinal vein occlusion with macular edema of left eye  H34.8320 Intravitreal Injection, Pharmacologic Agent - OS - Left Eye    bevacizumab (AVASTIN) SOSY 2.5 mg    3. Intermediate stage nonexudative age-related macular degeneration of right eye  H35.3112     4. Posterior vitreous detachment of right eye  H43.811       1.  Chronic active BRVO with macular edema OU and stable acuity.  Much less thickening on therapy and  consistently at 5 to 6 weeks repeat Avastin today and reevaluate next in 5 to 6 weeks  2.  Intermediate ARMD no sign of CNVM  3.  Ophthalmic Meds Ordered this visit:  Meds ordered this encounter  Medications   bevacizumab (AVASTIN) SOSY 2.5 mg   bevacizumab (AVASTIN) SOSY 2.5 mg       No follow-ups on file.  There are no Patient Instructions on file for this visit.   Explained the diagnoses, plan, and follow up with the patient and they expressed understanding.  Patient expressed understanding of the importance of proper follow up care.   Clent Demark Jourdon Zimmerle M.D. Diseases & Surgery of the Retina and Vitreous [facility] [place] 12/16/21     Abbreviations: M myopia (nearsighted); A astigmatism; H hyperopia (farsighted); P presbyopia; Mrx spectacle prescription;  CTL contact lenses; OD right eye; OS left eye; OU both eyes  XT exotropia; ET esotropia; PEK punctate epithelial keratitis; PEE punctate epithelial erosions; DES dry eye syndrome; MGD meibomian gland dysfunction; ATs artificial tears; PFAT's preservative free artificial tears; [place] nuclear sclerotic cataract; PSC posterior subcapsular cataract; ERM epi-retinal membrane; PVD posterior vitreous detachment; RD retinal detachment; DM diabetes mellitus; DR diabetic retinopathy; NPDR non-proliferative diabetic retinopathy; PDR proliferative diabetic retinopathy; CSME clinically significant macular edema; DME diabetic macular edema; dbh dot blot hemorrhages; CWS cotton wool spot; POAG primary open angle glaucoma; C/D cup-to-disc ratio; HVF humphrey visual field; GVF goldmann visual field; OCT optical coherence tomography; IOP intraocular pressure; BRVO Branch retinal vein occlusion; CRVO central retinal vein occlusion; CRAO central retinal artery occlusion; BRAO branch retinal artery occlusion; RT retinal tear; SB scleral buckle; PPV pars plana vitrectomy; VH Vitreous hemorrhage; PRP panretinal laser photocoagulation; IVK  intravitreal kenalog; VMT vitreomacular traction; MH Macular hole;  NVD neovascularization of the disc; NVE neovascularization elsewhere; AREDS age related eye disease study; ARMD age related macular degeneration; POAG primary open angle glaucoma; EBMD epithelial/anterior basement membrane dystrophy; ACIOL anterior chamber intraocular lens; IOL intraocular lens; PCIOL posterior chamber intraocular lens; Phaco/IOL phacoemulsification with intraocular lens placement; [place] photorefractive keratectomy; LASIK laser assisted in situ keratomileusis; HTN hypertension; DM diabetes mellitus; COPD chronic obstructive pulmonary disease

## 2021-12-16 NOTE — Assessment & Plan Note (Signed)

## 2021-12-17 DIAGNOSIS — D649 Anemia, unspecified: Secondary | ICD-10-CM | POA: Diagnosis not present

## 2021-12-29 DIAGNOSIS — M5412 Radiculopathy, cervical region: Secondary | ICD-10-CM | POA: Diagnosis not present

## 2022-01-20 DIAGNOSIS — M5412 Radiculopathy, cervical region: Secondary | ICD-10-CM | POA: Diagnosis not present

## 2022-01-27 ENCOUNTER — Encounter (INDEPENDENT_AMBULATORY_CARE_PROVIDER_SITE_OTHER): Payer: Self-pay | Admitting: Ophthalmology

## 2022-01-27 ENCOUNTER — Ambulatory Visit (INDEPENDENT_AMBULATORY_CARE_PROVIDER_SITE_OTHER): Payer: Medicare PPO | Admitting: Ophthalmology

## 2022-01-27 DIAGNOSIS — H43811 Vitreous degeneration, right eye: Secondary | ICD-10-CM

## 2022-01-27 DIAGNOSIS — H43812 Vitreous degeneration, left eye: Secondary | ICD-10-CM | POA: Diagnosis not present

## 2022-01-27 DIAGNOSIS — H353112 Nonexudative age-related macular degeneration, right eye, intermediate dry stage: Secondary | ICD-10-CM

## 2022-01-27 DIAGNOSIS — H34833 Tributary (branch) retinal vein occlusion, bilateral, with macular edema: Secondary | ICD-10-CM

## 2022-01-27 DIAGNOSIS — H43813 Vitreous degeneration, bilateral: Secondary | ICD-10-CM

## 2022-01-27 DIAGNOSIS — H34832 Tributary (branch) retinal vein occlusion, left eye, with macular edema: Secondary | ICD-10-CM | POA: Diagnosis not present

## 2022-01-27 DIAGNOSIS — H34831 Tributary (branch) retinal vein occlusion, right eye, with macular edema: Secondary | ICD-10-CM | POA: Diagnosis not present

## 2022-01-27 MED ORDER — BEVACIZUMAB CHEMO INJECTION 1.25MG/0.05ML SYRINGE FOR KALEIDOSCOPE
1.2500 mg | INTRAVITREAL | Status: AC | PRN
  Administered 2022-01-27: 1.25 mg via INTRAVITREAL

## 2022-01-27 NOTE — Assessment & Plan Note (Signed)
Physiologic 

## 2022-01-27 NOTE — Assessment & Plan Note (Signed)
Preserved acuity and recurrent CME otherwise stable and controlled today at 5 weeks.  We will attempt to lengthen interval follow-up next 7 to 8 weeks

## 2022-01-27 NOTE — Assessment & Plan Note (Signed)
Preserved acuity and recurrent CME otherwise stable and controlled today at 5 weeks.  We will attempt to lengthen interval follow-up next 7 to 8 weeks 

## 2022-01-27 NOTE — Progress Notes (Signed)
01/27/2022     CHIEF COMPLAINT Patient presents for  Chief Complaint  Patient presents with   Branch Retinal Vein Occlusion      HISTORY OF PRESENT ILLNESS: [name] is a 81 y.o. male who presents to the clinic today for:   HPI   History of bilateral macular branch vein occlusion with recurrence of CME yet stable acuity in the past.  5-6 weeks dilate ou avastin oct od os Pt states his vision has been stable Pt denies any new floaters or FOL  Last edited by Ralph Crape, MD on 01/27/2022 10:30 AM.      Referring physician: [name], MD [address] [place],  [place] [address]  HISTORICAL INFORMATION:   Selected notes from the MEDICAL RECORD NUMBER       CURRENT MEDICATIONS: No current outpatient medications on file. (Ophthalmic Drugs)   No current facility-administered medications for this visit. (Ophthalmic Drugs)   Current Outpatient Medications (Other)  Medication Sig   aspirin 81 MG tablet Take 81 mg by mouth daily.   carvedilol (COREG) 12.5 MG tablet Take 1 tablet (12.5 mg total) by mouth 2 (two) times daily. (Patient taking differently: Take 6.25 mg by mouth 2 (two) times daily.)   Cholecalciferol (VITAMIN D) 1000 UNITS capsule Take 1,000 Units by mouth daily.   Coenzyme Q10 300 MG CAPS Take 1 capsule by mouth daily.   Cyanocobalamin (B-12) 1000 MCG CAPS Take 1 tablet by mouth daily.    levothyroxine (SYNTHROID, LEVOTHROID) 75 MCG tablet Take 1 tablet by mouth daily.   Misc Natural Products (PROSTATE HEALTH PO) Take 1 tablet by mouth daily. Unknown strength per patient   Multiple Vitamin (MULTIVITAMIN) tablet Take 2 tablets by mouth daily. Unknown strength   nitroGLYCERIN (NITROSTAT) 0.4 MG SL tablet Place 1 tablet under the tongue every 5 minutes as needed. (Patient taking differently: Place 0.4 mg under the tongue every 5 (five) minutes as needed for chest pain.)   Omega-3 Fatty Acids (FISH OIL) 1200 MG CAPS Take 1,200 mg by mouth 2 (two) times  daily.    rosuvastatin (CRESTOR) 5 MG tablet Take 1 tablet by mouth daily.   No current facility-administered medications for this visit. (Other)      REVIEW OF SYSTEMS: ROS   Negative for: Constitutional, Gastrointestinal, Neurological, Skin, Genitourinary, Musculoskeletal, HENT, Endocrine, Cardiovascular, Eyes, Respiratory, Psychiatric, Allergic/Imm, Heme/Lymph Last edited by Erling Cruz D, CMA on 01/27/2022  9:45 AM.       ALLERGIES No Known Allergies  PAST MEDICAL HISTORY Past Medical History:  Diagnosis Date   Anxiety    ANXIETY 07/28/2010   Qualifier: Diagnosis of  By: [name] LPN, [name]     Arthritis    neck; limited neck movement   Branch retinal vein occlusion with macular edema of left eye 09/05/2019   Branch retinal vein occlusion with macular edema of right eye 09/05/2019   Chronic cholecystitis 05/13/2020   Chronic rhinitis    Coronary artery disease    Coronary artery disease involving native coronary artery of native heart 05/11/2016   Depression    no treatment since he retired   DEPRESSION 07/28/2010   Qualifier: Diagnosis of  By: [name] LPN, [name]     Dyspnea    with exertion   Essential hypertension 07/28/2010   Qualifier: Diagnosis of  By: [name] LPN, [name]     Essential tremor 09/07/2017   Gallbladder sludge 05/13/2020   GERD (gastroesophageal reflux disease)    Headache  hx subdural hematoma   Hyperlipidemia    HYPERLIPIDEMIA 07/28/2010   Qualifier: Diagnosis of  By: [name] LPN, [name]     Hypertension    Hypothyroidism    Intermediate stage nonexudative age-related macular degeneration of right eye 09/05/2019   Ischemic cardiomyopathy 05/11/2016   OBSTRUCTIVE SLEEP APNEA 07/29/2010   NPSG 2006:  AHI 18/hr. Pressure optimized to 12cm by auto device 2012     OSA (obstructive sleep apnea)    Posterior vitreous detachment of left eye 09/05/2019   Posterior vitreous detachment of right eye 09/05/2019   Postoperative examination 06/10/2020    RESTLESS LEGS SYNDROME 07/29/2010   Trial of requip 2012:  +intolerance, didn't feel it made a difference    Status post coronary artery bypass graft 05/11/2016   Overview:  2005  Formatting of this note might be different from the original. 2005   Stroke [facility]) ~59yrs ago   mild affects Lt eye   Subdural hematoma (HCC) 2004   Past Surgical History:  Procedure Laterality Date   APPENDECTOMY  1970s   BRAIN HEMATOMA EVACUATION  ~2005   subdural hematoma   CARPAL TUNNEL RELEASE Right 05/21/2016   Procedure: CARPAL TUNNEL RELEASE, right;  Surgeon: Cindee Salt, MD;  Location: [facility];  Service: Orthopedics;  Laterality: Right;  FAB   CATARACT EXTRACTION Left    CORONARY ARTERY BYPASS GRAFT  ~2006   gall bladder removed   05/2020   Heart bypass  2005   ROTATOR CUFF REPAIR Right ~4 years    FAMILY HISTORY Family History  Problem Relation Age of Onset   Heart disease Father    Leukemia Son    Stroke Mother     SOCIAL HISTORY Social History   Tobacco Use   Smoking status: Never   Smokeless tobacco: Never  Vaping Use   Vaping Use: Never used  Substance Use Topics   Alcohol use: No   Drug use: No         OPHTHALMIC EXAM:  Base Eye Exam     Visual Acuity (ETDRS)       Right Left   Dist [place] 20/15 20/15         Tonometry (Tonopen, 9:49 AM)       Right Left   Pressure 12 10         Pachymetry (05/20/2021)       Right Left   Thickness 15 20         Pupils       Pupils APD   Right PERRL None   Left PERRL None         Visual Fields       Left Right    Full Full         Extraocular Movement       Right Left    Full, Ortho Full, Ortho         Neuro/Psych     Oriented x3: Yes   Mood/Affect: Normal         Dilation     Both eyes: 1.0% Mydriacyl, 2.5% Phenylephrine @ 9:46 AM           Slit Lamp and Fundus Exam     External Exam       Right Left   External Normal Normal         Slit Lamp Exam        Right Left   Lids/Lashes Normal Normal   Conjunctiva/Sclera White and quiet White  and quiet   Cornea Clear Clear   Anterior Chamber Deep and quiet Deep and quiet   Iris Round and reactive Round and reactive   Lens Posterior chamber intraocular lens Posterior chamber intraocular lens   Anterior Vitreous Normal Normal         Fundus Exam       Right Left   Posterior Vitreous Posterior vitreous detachment Posterior vitreous detachment, Central vitreous floaters   Disc Normal Pallor 1+   C/D Ratio 0.35 0.6   Macula Cystoid macular edema, Macular thickening, no exudates, Microaneurysms Microaneurysms along the inferotemporal border of the FAZ, Hard drusen, no exudates, no atrophy, Cystoid macular edema, no macular thickening   Vessels Twig macular branch retinal vein occlusion. Old macular branch retinal vein occlusion   Periphery Normal Normal            IMAGING AND PROCEDURES  Imaging and Procedures for 01/27/22  OCT, Retina - OU - Both Eyes       Right Eye Quality was good. Scan locations included subfoveal. Central Foveal Thickness: 382. Progression has improved. Findings include abnormal foveal contour, cystoid macular edema.   Left Eye Quality was good. Scan locations included subfoveal. Central Foveal Thickness: 296. Progression has improved. Findings include abnormal foveal contour, cystoid macular edema.   Notes CME OD, juxta foveal from macular twig branch retinal vein occlusion.  Improved overall currently at 6-week follow-up interval, repeat injection OD today and again in 6 weeks OD, still very active OD, but stable   OS improved OS, at prior 6 week post injection Avastin, and vastly improved today again at 6-week interval OS, and stable      Intravitreal Injection, Pharmacologic Agent - OD - Right Eye       Time Out 01/27/2022. 10:32 AM. Confirmed correct patient, procedure, site, and patient consented.   Anesthesia Topical anesthesia was used.  Anesthetic medications included Lidocaine 4%.   Procedure Preparation included 5% betadine to ocular surface, 10% betadine to eyelids, Tobramycin 0.3%. A 30 gauge needle was used.   Injection: 1.25 mg Bevacizumab 1.25mg /0.30ml   Route: Intravitreal, Site: Right Eye   NDC: H061816   Post-op Post injection exam found visual acuity of at least counting fingers. The patient tolerated the procedure well. There were no complications. The patient received written and verbal post procedure care education. Post injection medications included ocuflox.      Intravitreal Injection, Pharmacologic Agent - OS - Left Eye       Time Out 01/27/2022. 10:33 AM. Confirmed correct patient, procedure, site, and patient consented.   Anesthesia Topical anesthesia was used. Anesthetic medications included Lidocaine 4%.   Procedure Preparation included 5% betadine to ocular surface, 10% betadine to eyelids, Ofloxacin . A 30 gauge needle was used.   Injection: 1.25 mg Bevacizumab 1.25mg /0.25ml   Route: Intravitreal, Site: Left Eye   NDC: H061816, LotNS:[id number], Expiration date: 04/15/2022   Post-op Post injection exam found visual acuity of at least counting fingers. The patient tolerated the procedure well. There were no complications. The patient received written and verbal post procedure care education. Post injection medications included ocuflox.   Notes Leiter's 0.1 cc             ASSESSMENT/PLAN:  Branch retinal vein occlusion with macular edema of right eye Preserved acuity and recurrent CME otherwise stable and controlled today at 5 weeks.  We will attempt to lengthen interval follow-up next 7 to 8 weeks  Branch retinal vein occlusion with macular edema  of left eye Preserved acuity and recurrent CME otherwise stable and controlled today at 5 weeks.  We will attempt to lengthen interval follow-up next 7 to 8 weeks  Intermediate stage nonexudative age-related macular degeneration  of right eye No sign of CNVM  Posterior vitreous detachment of right eye Physiologic  Posterior vitreous detachment of left eye Physiologic no holes     ICD-10-CM   1. Branch retinal vein occlusion with macular edema of right eye  H34.8310 OCT, Retina - OU - Both Eyes    Intravitreal Injection, Pharmacologic Agent - OD - Right Eye    Bevacizumab (AVASTIN) SOLN 1.25 mg    2. Branch retinal vein occlusion with macular edema of left eye  H34.8320 Intravitreal Injection, Pharmacologic Agent - OS - Left Eye    Bevacizumab (AVASTIN) SOLN 1.25 mg    3. Intermediate stage nonexudative age-related macular degeneration of right eye  H35.3112     4. Posterior vitreous detachment of right eye  H43.811     5. Posterior vitreous detachment of left eye  H43.812       1.  Bilateral CME controlled and stable today again at 5 weeks.  Long duration of suppression of disease and maintain a good acuity.  Repeat injection today reevaluate next 7 to 8 weeks  2.  3.  Ophthalmic Meds Ordered this visit:  Meds ordered this encounter  Medications   Bevacizumab (AVASTIN) SOLN 1.25 mg   Bevacizumab (AVASTIN) SOLN 1.25 mg       Return in about 8 weeks (around 03/24/2022) for DILATE OU, AVASTIN OCT.  There are no Patient Instructions on file for this visit.   Explained the diagnoses, plan, and follow up with the patient and they expressed understanding.  Patient expressed understanding of the importance of proper follow up care.   Clent Demark Jaramie Bastos M.D. Diseases & Surgery of the Retina and Vitreous [facility] [place] 01/27/22     Abbreviations: M myopia (nearsighted); A astigmatism; H hyperopia (farsighted); P presbyopia; Mrx spectacle prescription;  CTL contact lenses; OD right eye; OS left eye; OU both eyes  XT exotropia; ET esotropia; PEK punctate epithelial keratitis; PEE punctate epithelial erosions; DES dry eye syndrome; MGD meibomian gland dysfunction; ATs artificial tears;  PFAT's preservative free artificial tears; [place] nuclear sclerotic cataract; PSC posterior subcapsular cataract; ERM epi-retinal membrane; PVD posterior vitreous detachment; RD retinal detachment; DM diabetes mellitus; DR diabetic retinopathy; NPDR non-proliferative diabetic retinopathy; PDR proliferative diabetic retinopathy; CSME clinically significant macular edema; DME diabetic macular edema; dbh dot blot hemorrhages; CWS cotton wool spot; POAG primary open angle glaucoma; C/D cup-to-disc ratio; HVF humphrey visual field; GVF goldmann visual field; OCT optical coherence tomography; IOP intraocular pressure; BRVO Branch retinal vein occlusion; CRVO central retinal vein occlusion; CRAO central retinal artery occlusion; BRAO branch retinal artery occlusion; RT retinal tear; SB scleral buckle; PPV pars plana vitrectomy; VH Vitreous hemorrhage; PRP panretinal laser photocoagulation; IVK intravitreal kenalog; VMT vitreomacular traction; MH Macular hole;  NVD neovascularization of the disc; NVE neovascularization elsewhere; AREDS age related eye disease study; ARMD age related macular degeneration; POAG primary open angle glaucoma; EBMD epithelial/anterior basement membrane dystrophy; ACIOL anterior chamber intraocular lens; IOL intraocular lens; PCIOL posterior chamber intraocular lens; Phaco/IOL phacoemulsification with intraocular lens placement; [place] photorefractive keratectomy; LASIK laser assisted in situ keratomileusis; HTN hypertension; DM diabetes mellitus; COPD chronic obstructive pulmonary disease

## 2022-01-27 NOTE — Assessment & Plan Note (Signed)
No sign of CNVM 

## 2022-01-27 NOTE — Assessment & Plan Note (Signed)
Physiologic no holes 

## 2022-03-06 ENCOUNTER — Ambulatory Visit: Payer: Medicare PPO | Attending: Cardiology | Admitting: Cardiology

## 2022-03-06 ENCOUNTER — Encounter: Payer: Self-pay | Admitting: Cardiology

## 2022-03-06 VITALS — BP 124/68 | HR 74 | Ht 70.0 in | Wt 189.0 lb

## 2022-03-06 DIAGNOSIS — I10 Essential (primary) hypertension: Secondary | ICD-10-CM | POA: Diagnosis not present

## 2022-03-06 DIAGNOSIS — I2510 Atherosclerotic heart disease of native coronary artery without angina pectoris: Secondary | ICD-10-CM

## 2022-03-06 DIAGNOSIS — I255 Ischemic cardiomyopathy: Secondary | ICD-10-CM

## 2022-03-06 DIAGNOSIS — R0609 Other forms of dyspnea: Secondary | ICD-10-CM

## 2022-03-06 DIAGNOSIS — G4733 Obstructive sleep apnea (adult) (pediatric): Secondary | ICD-10-CM | POA: Diagnosis not present

## 2022-03-06 DIAGNOSIS — E782 Mixed hyperlipidemia: Secondary | ICD-10-CM

## 2022-03-06 NOTE — Progress Notes (Signed)
Cardiology Office Note:    Date:  03/06/2022   ID:  [name], DOB 1941/01/02, MRN [id number]  PCP:  Ralph Kiel, MD  Cardiologist:  Ralph Campus, MD    Referring MD: Ralph Kiel, MD   No chief complaint on file. Doing well but and tired  History of Present Illness:    [name] is a 81 y.o. male with past medical history significant for coronary artery disease, coronary artery bypass graft 2006, last estimation of his coronary arteries was done by stress test in 2021 which was negative, dyslipidemia, obstructive sleep apnea on CPAP, essential hypertension, history of cardiomyopathy which were ischemic in origin however last echocardiogram showed improvement left ventricle ejection fraction. Comes today 2 months for follow-up.  Overall doing well.  Denies have any chest pain tightness squeezing pressure burning chest but does complain of having shortness of breath.  Past Medical History:  Diagnosis Date   Anxiety    ANXIETY 07/28/2010   Qualifier: Diagnosis of  By: Doy Mince LPN, [name]     Arthritis    neck; limited neck movement   Branch retinal vein occlusion with macular edema of left eye 09/05/2019   Branch retinal vein occlusion with macular edema of right eye 09/05/2019   Chronic cholecystitis 05/13/2020   Chronic rhinitis    Coronary artery disease    Coronary artery disease involving native coronary artery of native heart 05/11/2016   Depression    no treatment since he retired   [place] 07/28/2010   Qualifier: Diagnosis of  By: Doy Mince LPN, [name]     Dyspnea    with exertion   Essential hypertension 07/28/2010   Qualifier: Diagnosis of  By: Doy Mince LPN, [name]     Essential tremor 09/07/2017   Gallbladder sludge 05/13/2020   GERD (gastroesophageal reflux disease)    Headache    hx subdural hematoma   Hyperlipidemia    HYPERLIPIDEMIA 07/28/2010   Qualifier: Diagnosis of  By: Doy Mince LPN, [name]     Hypertension    Hypothyroidism     Intermediate stage nonexudative age-related macular degeneration of right eye 09/05/2019   Ischemic cardiomyopathy 05/11/2016   OBSTRUCTIVE SLEEP APNEA 07/29/2010   NPSG 2006:  AHI 18/hr. Pressure optimized to 12cm by auto device 2012     OSA (obstructive sleep apnea)    Posterior vitreous detachment of left eye 09/05/2019   Posterior vitreous detachment of right eye 09/05/2019   Postoperative examination 06/10/2020   RESTLESS LEGS SYNDROME 07/29/2010   Trial of requip 2012:  +intolerance, didn't feel it made a difference    Status post coronary artery bypass graft 05/11/2016   Overview:  2005  Formatting of this note might be different from the original. 2005   Stroke [facility]) ~73yrs ago   mild affects Lt eye   Subdural hematoma ([place]) 2004    Past Surgical History:  Procedure Laterality Date   APPENDECTOMY  1970s   BRAIN HEMATOMA EVACUATION  ~2005   subdural hematoma   CARPAL TUNNEL RELEASE Right 05/21/2016   Procedure: CARPAL TUNNEL RELEASE, right;  Surgeon: Daryll Brod, MD;  Location: [place];  Service: Orthopedics;  Laterality: Right;  FAB   CATARACT EXTRACTION Left    CORONARY ARTERY BYPASS GRAFT  ~2006   gall bladder removed   05/2020   Heart bypass  2005   ROTATOR CUFF REPAIR Right ~4 years    Current Medications: Current Meds  Medication Sig   aspirin 81 MG tablet Take 81 mg  by mouth daily.   carvedilol (COREG) 6.25 MG tablet Take 6.25 mg by mouth 2 (two) times daily.   Cholecalciferol (VITAMIN D) 1000 UNITS capsule Take 1,000 Units by mouth daily.   Coenzyme Q10 300 MG CAPS Take 1 capsule by mouth daily.   Cyanocobalamin (B-12) 1000 MCG CAPS Take 1 tablet by mouth daily.    levothyroxine (SYNTHROID, LEVOTHROID) 75 MCG tablet Take 1 tablet by mouth daily.   Misc Natural Products (PROSTATE HEALTH PO) Take 1 tablet by mouth daily. Unknown strength per patient   Multiple Vitamin (MULTIVITAMIN) tablet Take 2 tablets by mouth daily. Unknown strength   nitroGLYCERIN  (NITROSTAT) 0.4 MG SL tablet Place 1 tablet under the tongue every 5 minutes as needed. (Patient taking differently: Place 0.4 mg under the tongue every 5 (five) minutes as needed for chest pain.)   Omega-3 Fatty Acids (FISH OIL) 1200 MG CAPS Take 1,200 mg by mouth 2 (two) times daily.    rosuvastatin (CRESTOR) 5 MG tablet Take 1 tablet by mouth daily.     Allergies:   Patient has no known allergies.   Social History   Socioeconomic History   Marital status: Married    Spouse name: [name]   Number of children: 1   Years of education: Not on file   Highest education level: Not on file  Occupational History    Comment: Psychologist, sport and exercise    Comment: [employer]  Tobacco Use   Smoking status: Never   Smokeless tobacco: Never  Vaping Use   Vaping Use: Never used  Substance and Sexual Activity   Alcohol use: No   Drug use: No   Sexual activity: Not on file  Other Topics Concern   Not on file  Social History Narrative   Pt works part time at [employer] and part time as a Manufacturing engineer of Radio broadcast assistant Strain: Not on Art therapist Insecurity: Not on file  Transportation Needs: Not on file  Physical Activity: Not on file  Stress: Not on file  Social Connections: Not on file     Family History: The patient's family history includes Heart disease in his father; Leukemia in his son; Stroke in his mother. ROS:   Please see the history of present illness.    All 14 point review of systems negative except as described per history of present illness  EKGs/Labs/Other Studies Reviewed:      Recent Labs: No results found for requested labs within last 365 days.  Recent Lipid Panel No results found for: "CHOL", "TRIG", "HDL", "CHOLHDL", "VLDL", "LDLCALC", "LDLDIRECT"  Physical Exam:    VS:  BP 124/68 (BP Location: Right Arm, Patient Position: Sitting)   Pulse 74   Ht 5\' 10"  (1.778 m)   Wt 189 lb (85.7 kg)   SpO2 97%   BMI 27.12 kg/m      Wt Readings from Last 3 Encounters:  03/06/22 189 lb (85.7 kg)  05/07/21 188 lb 6.4 oz (85.5 kg)  08/21/20 191 lb (86.6 kg)     GEN:  Well nourished, well developed in no acute distress HEENT: Normal NECK: No JVD; No carotid bruits LYMPHATICS: No lymphadenopathy CARDIAC: RRR, no murmurs, no rubs, no gallops RESPIRATORY:  Clear to auscultation without rales, wheezing or rhonchi  ABDOMEN: Soft, non-tender, non-distended MUSCULOSKELETAL:  No edema; No deformity  SKIN: Warm and dry LOWER EXTREMITIES: no swelling NEUROLOGIC:  Alert and oriented x 3 PSYCHIATRIC:  Normal affect  ASSESSMENT:    1. Coronary artery disease involving native coronary artery of native heart without angina pectoris   2. Essential hypertension   3. Ischemic cardiomyopathy   4. OBSTRUCTIVE SLEEP APNEA   5. Mixed hyperlipidemia    PLAN:    In order of problems listed above:  Coronary artery disease stable from that point review and appropriate medication which include antiplatelets therapy in form of aspirin. Dyslipidemia he is taking moderate intensity statin form of Crestor 5 mg daily, I do have his fasting lipid profile from primary care physician today reviewed LDL 59 HDL 38, good cholesterol profile continue present management. Ischemic cardiomyopathy, asking to have another echocardiogram done to check left ventricle ejection fraction, I am doing this from point review of shortness of breath as well as tiredness and fatigue. Essential hypertension blood pressure well controlled continue present management   Medication Adjustments/Labs and Tests Ordered: Current medicines are reviewed at length with the patient today.  Concerns regarding medicines are outlined above.  No orders of the defined types were placed in this encounter.  Medication changes: No orders of the defined types were placed in this encounter.   Signed, Park Liter, MD, [facility] 03/06/2022 9:30 AM    [place]

## 2022-03-06 NOTE — Patient Instructions (Addendum)

## 2022-03-09 DIAGNOSIS — M5030 Other cervical disc degeneration, unspecified cervical region: Secondary | ICD-10-CM | POA: Diagnosis not present

## 2022-03-09 DIAGNOSIS — M5412 Radiculopathy, cervical region: Secondary | ICD-10-CM | POA: Diagnosis not present

## 2022-03-17 ENCOUNTER — Ambulatory Visit: Payer: Medicare PPO | Attending: Cardiology

## 2022-03-17 DIAGNOSIS — R0609 Other forms of dyspnea: Secondary | ICD-10-CM

## 2022-03-17 LAB — ECHOCARDIOGRAM COMPLETE
Area-P 1/2: 3 cm2
P 1/2 time: 848 msec
S' Lateral: 3.7 cm

## 2022-03-23 ENCOUNTER — Telehealth: Payer: Self-pay

## 2022-03-23 NOTE — Telephone Encounter (Signed)
Results reviewed with pt's spouse as per Dr. Krasowski's note.  Pt's spouse verbalized understanding and had no additional questions. Routed to PCP  

## 2022-03-24 ENCOUNTER — Encounter (INDEPENDENT_AMBULATORY_CARE_PROVIDER_SITE_OTHER): Payer: Medicare PPO | Admitting: Ophthalmology

## 2022-03-25 ENCOUNTER — Encounter: Payer: Self-pay | Admitting: Cardiology

## 2022-03-31 DIAGNOSIS — Z6827 Body mass index (BMI) 27.0-27.9, adult: Secondary | ICD-10-CM | POA: Diagnosis not present

## 2022-03-31 DIAGNOSIS — G5603 Carpal tunnel syndrome, bilateral upper limbs: Secondary | ICD-10-CM | POA: Diagnosis not present

## 2022-03-31 DIAGNOSIS — M47812 Spondylosis without myelopathy or radiculopathy, cervical region: Secondary | ICD-10-CM | POA: Diagnosis not present

## 2022-03-31 DIAGNOSIS — M542 Cervicalgia: Secondary | ICD-10-CM | POA: Diagnosis not present

## 2022-04-13 DIAGNOSIS — G5601 Carpal tunnel syndrome, right upper limb: Secondary | ICD-10-CM | POA: Diagnosis not present

## 2022-04-15 DIAGNOSIS — M6281 Muscle weakness (generalized): Secondary | ICD-10-CM | POA: Diagnosis not present

## 2022-04-15 DIAGNOSIS — M542 Cervicalgia: Secondary | ICD-10-CM | POA: Diagnosis not present

## 2022-04-15 DIAGNOSIS — M2560 Stiffness of unspecified joint, not elsewhere classified: Secondary | ICD-10-CM | POA: Diagnosis not present

## 2022-04-17 DIAGNOSIS — H3540 Unspecified peripheral retinal degeneration: Secondary | ICD-10-CM | POA: Diagnosis not present

## 2022-04-17 DIAGNOSIS — H34833 Tributary (branch) retinal vein occlusion, bilateral, with macular edema: Secondary | ICD-10-CM | POA: Diagnosis not present

## 2022-04-17 DIAGNOSIS — H43813 Vitreous degeneration, bilateral: Secondary | ICD-10-CM | POA: Diagnosis not present

## 2022-04-21 DIAGNOSIS — M542 Cervicalgia: Secondary | ICD-10-CM | POA: Diagnosis not present

## 2022-04-21 DIAGNOSIS — Z6827 Body mass index (BMI) 27.0-27.9, adult: Secondary | ICD-10-CM | POA: Diagnosis not present

## 2022-05-14 DIAGNOSIS — Z79899 Other long term (current) drug therapy: Secondary | ICD-10-CM | POA: Diagnosis not present

## 2022-05-14 DIAGNOSIS — R531 Weakness: Secondary | ICD-10-CM | POA: Diagnosis not present

## 2022-05-14 DIAGNOSIS — R972 Elevated prostate specific antigen [PSA]: Secondary | ICD-10-CM | POA: Diagnosis not present

## 2022-05-14 DIAGNOSIS — R7301 Impaired fasting glucose: Secondary | ICD-10-CM | POA: Diagnosis not present

## 2022-05-14 DIAGNOSIS — Z6831 Body mass index (BMI) 31.0-31.9, adult: Secondary | ICD-10-CM | POA: Diagnosis not present

## 2022-05-14 DIAGNOSIS — Z0000 Encounter for general adult medical examination without abnormal findings: Secondary | ICD-10-CM | POA: Diagnosis not present

## 2022-05-14 DIAGNOSIS — Z1331 Encounter for screening for depression: Secondary | ICD-10-CM | POA: Diagnosis not present

## 2022-05-14 DIAGNOSIS — N1832 Chronic kidney disease, stage 3b: Secondary | ICD-10-CM | POA: Diagnosis not present

## 2022-05-14 DIAGNOSIS — E785 Hyperlipidemia, unspecified: Secondary | ICD-10-CM | POA: Diagnosis not present

## 2022-05-14 DIAGNOSIS — I10 Essential (primary) hypertension: Secondary | ICD-10-CM | POA: Diagnosis not present

## 2022-05-14 DIAGNOSIS — E039 Hypothyroidism, unspecified: Secondary | ICD-10-CM | POA: Diagnosis not present

## 2022-05-14 DIAGNOSIS — R519 Headache, unspecified: Secondary | ICD-10-CM | POA: Diagnosis not present

## 2022-05-20 DIAGNOSIS — M6281 Muscle weakness (generalized): Secondary | ICD-10-CM | POA: Diagnosis not present

## 2022-05-20 DIAGNOSIS — M2560 Stiffness of unspecified joint, not elsewhere classified: Secondary | ICD-10-CM | POA: Diagnosis not present

## 2022-05-20 DIAGNOSIS — M542 Cervicalgia: Secondary | ICD-10-CM | POA: Diagnosis not present

## 2022-05-26 DIAGNOSIS — H34833 Tributary (branch) retinal vein occlusion, bilateral, with macular edema: Secondary | ICD-10-CM | POA: Diagnosis not present

## 2022-05-26 DIAGNOSIS — H43813 Vitreous degeneration, bilateral: Secondary | ICD-10-CM | POA: Diagnosis not present

## 2022-05-26 DIAGNOSIS — H353112 Nonexudative age-related macular degeneration, right eye, intermediate dry stage: Secondary | ICD-10-CM | POA: Diagnosis not present

## 2022-05-26 DIAGNOSIS — G4733 Obstructive sleep apnea (adult) (pediatric): Secondary | ICD-10-CM | POA: Diagnosis not present

## 2022-06-04 DIAGNOSIS — M2560 Stiffness of unspecified joint, not elsewhere classified: Secondary | ICD-10-CM | POA: Diagnosis not present

## 2022-06-04 DIAGNOSIS — M6281 Muscle weakness (generalized): Secondary | ICD-10-CM | POA: Diagnosis not present

## 2022-06-04 DIAGNOSIS — M542 Cervicalgia: Secondary | ICD-10-CM | POA: Diagnosis not present

## 2022-06-15 DIAGNOSIS — N1832 Chronic kidney disease, stage 3b: Secondary | ICD-10-CM | POA: Diagnosis not present

## 2022-06-15 DIAGNOSIS — I129 Hypertensive chronic kidney disease with stage 1 through stage 4 chronic kidney disease, or unspecified chronic kidney disease: Secondary | ICD-10-CM | POA: Diagnosis not present

## 2022-06-15 DIAGNOSIS — R3916 Straining to void: Secondary | ICD-10-CM | POA: Diagnosis not present

## 2022-06-15 DIAGNOSIS — N401 Enlarged prostate with lower urinary tract symptoms: Secondary | ICD-10-CM | POA: Diagnosis not present

## 2022-06-15 DIAGNOSIS — E039 Hypothyroidism, unspecified: Secondary | ICD-10-CM | POA: Diagnosis not present

## 2022-06-15 DIAGNOSIS — I255 Ischemic cardiomyopathy: Secondary | ICD-10-CM | POA: Diagnosis not present

## 2022-06-16 LAB — LAB REPORT - SCANNED
Creatinine, POC: 148.7 mg/dL
Microalb Creat Ratio: 91

## 2022-06-18 DIAGNOSIS — M2560 Stiffness of unspecified joint, not elsewhere classified: Secondary | ICD-10-CM | POA: Diagnosis not present

## 2022-06-18 DIAGNOSIS — M542 Cervicalgia: Secondary | ICD-10-CM | POA: Diagnosis not present

## 2022-06-18 DIAGNOSIS — M6281 Muscle weakness (generalized): Secondary | ICD-10-CM | POA: Diagnosis not present

## 2022-07-02 DIAGNOSIS — M2560 Stiffness of unspecified joint, not elsewhere classified: Secondary | ICD-10-CM | POA: Diagnosis not present

## 2022-07-02 DIAGNOSIS — M6281 Muscle weakness (generalized): Secondary | ICD-10-CM | POA: Diagnosis not present

## 2022-07-02 DIAGNOSIS — M542 Cervicalgia: Secondary | ICD-10-CM | POA: Diagnosis not present

## 2022-07-07 DIAGNOSIS — H34833 Tributary (branch) retinal vein occlusion, bilateral, with macular edema: Secondary | ICD-10-CM | POA: Diagnosis not present

## 2022-07-07 DIAGNOSIS — H34832 Tributary (branch) retinal vein occlusion, left eye, with macular edema: Secondary | ICD-10-CM | POA: Diagnosis not present

## 2022-07-07 DIAGNOSIS — H43813 Vitreous degeneration, bilateral: Secondary | ICD-10-CM | POA: Diagnosis not present

## 2022-07-07 DIAGNOSIS — H43812 Vitreous degeneration, left eye: Secondary | ICD-10-CM | POA: Diagnosis not present

## 2022-07-07 DIAGNOSIS — H43811 Vitreous degeneration, right eye: Secondary | ICD-10-CM | POA: Diagnosis not present

## 2022-07-07 DIAGNOSIS — H353112 Nonexudative age-related macular degeneration, right eye, intermediate dry stage: Secondary | ICD-10-CM | POA: Diagnosis not present

## 2022-07-22 DIAGNOSIS — N529 Male erectile dysfunction, unspecified: Secondary | ICD-10-CM | POA: Diagnosis not present

## 2022-07-22 DIAGNOSIS — N401 Enlarged prostate with lower urinary tract symptoms: Secondary | ICD-10-CM | POA: Diagnosis not present

## 2022-07-22 DIAGNOSIS — R972 Elevated prostate specific antigen [PSA]: Secondary | ICD-10-CM | POA: Diagnosis not present

## 2022-08-13 DIAGNOSIS — N401 Enlarged prostate with lower urinary tract symptoms: Secondary | ICD-10-CM | POA: Diagnosis not present

## 2022-08-13 DIAGNOSIS — R972 Elevated prostate specific antigen [PSA]: Secondary | ICD-10-CM | POA: Diagnosis not present

## 2022-08-18 DIAGNOSIS — H34833 Tributary (branch) retinal vein occlusion, bilateral, with macular edema: Secondary | ICD-10-CM | POA: Diagnosis not present

## 2022-08-18 DIAGNOSIS — H43813 Vitreous degeneration, bilateral: Secondary | ICD-10-CM | POA: Diagnosis not present

## 2022-08-18 DIAGNOSIS — H353112 Nonexudative age-related macular degeneration, right eye, intermediate dry stage: Secondary | ICD-10-CM | POA: Diagnosis not present

## 2022-08-18 DIAGNOSIS — G4733 Obstructive sleep apnea (adult) (pediatric): Secondary | ICD-10-CM | POA: Diagnosis not present

## 2022-08-18 DIAGNOSIS — H43811 Vitreous degeneration, right eye: Secondary | ICD-10-CM | POA: Diagnosis not present

## 2022-08-18 DIAGNOSIS — H43812 Vitreous degeneration, left eye: Secondary | ICD-10-CM | POA: Diagnosis not present

## 2022-08-21 DIAGNOSIS — C61 Malignant neoplasm of prostate: Secondary | ICD-10-CM | POA: Diagnosis not present

## 2022-08-21 DIAGNOSIS — R972 Elevated prostate specific antigen [PSA]: Secondary | ICD-10-CM | POA: Diagnosis not present

## 2022-08-21 DIAGNOSIS — N401 Enlarged prostate with lower urinary tract symptoms: Secondary | ICD-10-CM | POA: Diagnosis not present

## 2022-09-01 DIAGNOSIS — I700 Atherosclerosis of aorta: Secondary | ICD-10-CM | POA: Diagnosis not present

## 2022-09-01 DIAGNOSIS — C61 Malignant neoplasm of prostate: Secondary | ICD-10-CM | POA: Diagnosis not present

## 2022-09-01 DIAGNOSIS — K5730 Diverticulosis of large intestine without perforation or abscess without bleeding: Secondary | ICD-10-CM | POA: Diagnosis not present

## 2022-09-18 DIAGNOSIS — N401 Enlarged prostate with lower urinary tract symptoms: Secondary | ICD-10-CM | POA: Diagnosis not present

## 2022-09-18 DIAGNOSIS — C61 Malignant neoplasm of prostate: Secondary | ICD-10-CM | POA: Diagnosis not present

## 2022-09-18 DIAGNOSIS — R972 Elevated prostate specific antigen [PSA]: Secondary | ICD-10-CM | POA: Diagnosis not present

## 2022-09-29 DIAGNOSIS — H353112 Nonexudative age-related macular degeneration, right eye, intermediate dry stage: Secondary | ICD-10-CM | POA: Diagnosis not present

## 2022-09-29 DIAGNOSIS — H43813 Vitreous degeneration, bilateral: Secondary | ICD-10-CM | POA: Diagnosis not present

## 2022-09-29 DIAGNOSIS — H34833 Tributary (branch) retinal vein occlusion, bilateral, with macular edema: Secondary | ICD-10-CM | POA: Diagnosis not present

## 2022-09-29 DIAGNOSIS — H43811 Vitreous degeneration, right eye: Secondary | ICD-10-CM | POA: Diagnosis not present

## 2022-09-29 DIAGNOSIS — H43812 Vitreous degeneration, left eye: Secondary | ICD-10-CM | POA: Diagnosis not present

## 2022-10-05 ENCOUNTER — Encounter: Payer: Self-pay | Admitting: Nephrology

## 2022-10-09 ENCOUNTER — Ambulatory Visit: Payer: Medicare PPO | Attending: Cardiology | Admitting: Cardiology

## 2022-10-09 ENCOUNTER — Encounter: Payer: Self-pay | Admitting: Cardiology

## 2022-10-09 VITALS — BP 120/80 | HR 74 | Ht 69.0 in | Wt 188.4 lb

## 2022-10-09 DIAGNOSIS — E785 Hyperlipidemia, unspecified: Secondary | ICD-10-CM | POA: Diagnosis not present

## 2022-10-09 DIAGNOSIS — I255 Ischemic cardiomyopathy: Secondary | ICD-10-CM | POA: Diagnosis not present

## 2022-10-09 DIAGNOSIS — C61 Malignant neoplasm of prostate: Secondary | ICD-10-CM

## 2022-10-09 DIAGNOSIS — I10 Essential (primary) hypertension: Secondary | ICD-10-CM | POA: Diagnosis not present

## 2022-10-09 DIAGNOSIS — R0609 Other forms of dyspnea: Secondary | ICD-10-CM | POA: Diagnosis not present

## 2022-10-09 DIAGNOSIS — Z951 Presence of aortocoronary bypass graft: Secondary | ICD-10-CM

## 2022-10-09 MED ORDER — ISOSORBIDE MONONITRATE ER 30 MG PO TB24: 30.0000 mg | ORAL_TABLET | Freq: Every day | ORAL | 3 refills | Status: DC

## 2022-10-09 MED ORDER — NITROGLYCERIN 0.4 MG SL SUBL: 0.4000 mg | SUBLINGUAL_TABLET | SUBLINGUAL | 6 refills | Status: DC | PRN

## 2022-10-09 NOTE — Patient Instructions (Signed)
Medication Instructions:   START: Imdur 30mg  1 tablet daily  START: Nitroglycerin Use nitroglycerin 1 tablet placed under the tongue at the first sign of chest pain or an angina attack. 1 tablet may be used every 5 minutes as needed, for up to 15 minutes. Do not take more than 3 tablets in 15 minutes. If pain persist call 911 or go to the nearest ED.     Lab Work: None Ordered If you have labs (blood work) drawn today and your tests are completely normal, you will receive your results only by: MyChart Message (if you have MyChart) OR A paper copy in the mail If you have any lab test that is abnormal or we need to change your treatment, we will call you to review the results.   Testing/Procedures: Your physician has requested that you have a lexiscan myoview. For further information please visit [email or web address] Please follow instruction sheet, as given.  The test will take approximately 3 to 4 hours to complete; you may bring reading material.  If someone comes with you to your appointment, they will need to remain in the main lobby due to limited space in the testing area.   How to prepare for your Myocardial Perfusion Test: Do not eat or drink 3 hours prior to your test, except you may have water. Do not consume products containing caffeine (regular or decaffeinated) 12 hours prior to your test. (ex: coffee, chocolate, sodas, tea). Do bring a list of your current medications with you.  If not listed below, you may take your medications as normal. Do wear comfortable clothes (no dresses or overalls) and walking shoes, tennis shoes preferred (No heels or open toe shoes are allowed). Do NOT wear cologne, perfume, aftershave, or lotions (deodorant is allowed). If these instructions are not followed, your test will have to be rescheduled.     Follow-Up: At [facility], you and your health needs are our priority.  As part of our continuing mission to provide you with exceptional  heart care, we have created designated Provider Care Teams.  These Care Teams include your primary Cardiologist (physician) and Advanced Practice Providers (APPs -  Physician Assistants and Nurse Practitioners) who all work together to provide you with the care you need, when you need it.  We recommend signing up for the patient portal called "MyChart".  Sign up information is provided on this After Visit Summary.  MyChart is used to connect with patients for Virtual Visits (Telemedicine).  Patients are able to view lab/test results, encounter notes, upcoming appointments, etc.  Non-urgent messages can be sent to your provider as well.   To learn more about what you can do with MyChart, go to [email or web address].    Your next appointment:   6 month(s)  The format for your next appointment:   In Person  Provider:   [name], MD    Other Instructions NA

## 2022-10-09 NOTE — Progress Notes (Unsigned)
Cardiology Office Note:    Date:  10/09/2022   ID:  Ralph KOCOUREK, DOB 1940/12/16, MRN [id number]  PCP:  [name], MD  Cardiologist:  [name], MD    Referring MD: [name], MD   Chief Complaint  Patient presents with   Chest Pain    Ongoing for 6 months   L arm pain    Ongoing for 6 months   circulation concerns    Both legs    History of Present Illness:    [name] is a 82 y.o. male with past medical history significant for coronary artery disease, coronary bypass graft done in 2006, last estimation of coronary artery status was done in form of stress test in 2021 which was negative, dyslipidemia, obstructive sleep apnea on CPAP, essential hypertension history of cardiomyopathy which was ischemic in origin with improvement. Comes today to months for follow-up he described to have rare episode of exertional chest tightness when he walks uphill and upstairs.  Not to the point that he disrupt his activities daily living he tells me that he is not too active.  Also described to have some cold feet.  Past Medical History:  Diagnosis Date   Anxiety    ANXIETY 07/28/2010   Qualifier: Diagnosis of  By: [name] LPN, [name]     Arthritis    neck; limited neck movement   Branch retinal vein occlusion with macular edema of left eye 09/05/2019   Branch retinal vein occlusion with macular edema of right eye 09/05/2019   Chronic cholecystitis 05/13/2020   Chronic rhinitis    Coronary artery disease    Coronary artery disease involving native coronary artery of native heart 05/11/2016   Depression    no treatment since he retired   DEPRESSION 07/28/2010   Qualifier: Diagnosis of  By: [name] LPN, [name]     Dyspnea    with exertion   Essential hypertension 07/28/2010   Qualifier: Diagnosis of  By: [name] LPN, [name]     Essential tremor 09/07/2017   Gallbladder sludge 05/13/2020   GERD (gastroesophageal reflux disease)    Headache    hx subdural  hematoma   Hyperlipidemia    HYPERLIPIDEMIA 07/28/2010   Qualifier: Diagnosis of  By: [name] LPN, [name]     Hypertension    Hypothyroidism    Intermediate stage nonexudative age-related macular degeneration of right eye 09/05/2019   Ischemic cardiomyopathy 05/11/2016   OBSTRUCTIVE SLEEP APNEA 07/29/2010   NPSG 2006:  AHI 18/hr. Pressure optimized to 12cm by auto device 2012     OSA (obstructive sleep apnea)    Posterior vitreous detachment of left eye 09/05/2019   Posterior vitreous detachment of right eye 09/05/2019   Postoperative examination 06/10/2020   RESTLESS LEGS SYNDROME 07/29/2010   Trial of requip 2012:  +intolerance, didn't feel it made a difference    Status post coronary artery bypass graft 05/11/2016   Overview:  2005  Formatting of this note might be different from the original. 2005   Stroke [facility]) ~80yrs ago   mild affects Lt eye   Subdural hematoma (HCC) 2004    Past Surgical History:  Procedure Laterality Date   APPENDECTOMY  1970s   BRAIN HEMATOMA EVACUATION  ~2005   subdural hematoma   CARPAL TUNNEL RELEASE Right 05/21/2016   Procedure: CARPAL TUNNEL RELEASE, right;  Surgeon: Cindee Salt, MD;  Location: [facility];  Service: Orthopedics;  Laterality: Right;  FAB   CATARACT EXTRACTION Left  CORONARY ARTERY BYPASS GRAFT  ~2006   gall bladder removed   05/2020   Heart bypass  2005   ROTATOR CUFF REPAIR Right ~4 years    Current Medications: Current Meds  Medication Sig   aspirin 81 MG tablet Take 81 mg by mouth daily.   carvedilol (COREG) 6.25 MG tablet Take 6.25 mg by mouth 2 (two) times daily.   Cholecalciferol (VITAMIN D) 1000 UNITS capsule Take 1,000 Units by mouth daily.   Coenzyme Q10 300 MG CAPS Take 1 capsule by mouth daily.   Cyanocobalamin (B-12) 1000 MCG CAPS Take 1 tablet by mouth daily.    finasteride (PROSCAR) 5 MG tablet Take 5 mg by mouth daily.   levothyroxine (SYNTHROID, LEVOTHROID) 75 MCG tablet Take 1 tablet by mouth daily.    Misc Natural Products (PROSTATE HEALTH PO) Take 1 tablet by mouth daily. Unknown strength per patient   Multiple Vitamin (MULTIVITAMIN) tablet Take 2 tablets by mouth daily. Unknown strength   nitroGLYCERIN (NITROSTAT) 0.4 MG SL tablet Place 1 tablet under the tongue every 5 minutes as needed. (Patient taking differently: Place 0.4 mg under the tongue every 5 (five) minutes as needed for chest pain.)   Omega-3 Fatty Acids (FISH OIL) 1200 MG CAPS Take 1,200 mg by mouth 2 (two) times daily.    rosuvastatin (CRESTOR) 5 MG tablet Take 1 tablet by mouth daily.     Allergies:   Patient has no known allergies.   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 ROS:   Please see the history of present illness.    All 14 point review of systems negative except as described per history of present illness  EKGs/Labs/Other Studies Reviewed:      Recent Labs: No results found for requested labs within last 365 days.  Recent Lipid Panel No results found for: "CHOL", "TRIG", "HDL", "CHOLHDL",  "VLDL", "LDLCALC", "LDLDIRECT"  Physical Exam:    VS:  BP 120/80 (BP Location: Left Arm, Patient Position: Sitting)   Pulse 74   Ht 5\' 9"  (1.753 m)   Wt 188 lb 6.4 oz (85.5 kg)   SpO2 97%   BMI 27.82 kg/m     Wt Readings from Last 3 Encounters:  10/09/22 188 lb 6.4 oz (85.5 kg)  03/06/22 189 lb (85.7 kg)  05/07/21 188 lb 6.4 oz (85.5 kg)     GEN:  Well nourished, well developed in no  acute distress HEENT: Normal NECK: No JVD; No carotid bruits LYMPHATICS: No lymphadenopathy CARDIAC: RRR, no murmurs, no rubs, no gallops RESPIRATORY:  Clear to auscultation without rales, wheezing or rhonchi  ABDOMEN: Soft, non-tender, non-distended MUSCULOSKELETAL:  No edema; No deformity  SKIN: Warm and dry LOWER EXTREMITIES: no swelling NEUROLOGIC:  Alert and oriented x 3 PSYCHIATRIC:  Normal affect   ASSESSMENT:    1. Prostate cancer (HCC)   2. Status post coronary artery bypass graft   3. Dyslipidemia (high LDL; low HDL)   4. Ischemic cardiomyopathy   5. Essential hypertension    PLAN:    In order of problems listed above:  Coronary artery disease status post coronary bypass graft.  Will schedule him to have a stress test to make sure he does not have any significant area of ischemia I will initiate Imdur 30 mg daily continue rest of the medications. Dyslipidemia he show me his good cholesterol profile his LDL is less than 70 we will continue present management. History of ischemic cardiomyopathy will wait for results of stress test to see if anything else need to be done. Essential hypertension blood pressure appears to be well-controlled.   Medication Adjustments/Labs and Tests Ordered: Current medicines are reviewed at length with the patient today.  Concerns regarding medicines are outlined above.  No orders of the defined types were placed in this encounter.  Medication changes: No orders of the defined types were placed in this encounter.   Signed, Georgeanna Lea,  MD, [facility] 10/09/2022 2:46 PM    [facility]

## 2022-10-13 ENCOUNTER — Telehealth (HOSPITAL_COMMUNITY): Payer: Self-pay | Admitting: *Deleted

## 2022-10-13 NOTE — Telephone Encounter (Signed)
Pt reached and given instructions for mpi study scheduled on 10/15/22.

## 2022-10-15 ENCOUNTER — Ambulatory Visit: Payer: Medicare PPO | Attending: Cardiology

## 2022-10-15 ENCOUNTER — Telehealth: Payer: Self-pay

## 2022-10-15 DIAGNOSIS — R0609 Other forms of dyspnea: Secondary | ICD-10-CM | POA: Diagnosis not present

## 2022-10-15 LAB — MYOCARDIAL PERFUSION IMAGING
LV dias vol: 80 mL (ref 62–150)
LV sys vol: 34 mL
Nuc Stress EF: 57 %
Peak HR: 73 {beats}/min
Rest HR: 56 {beats}/min
Rest Nuclear Isotope Dose: 10.6 mCi
SDS: 6
SRS: 0
SSS: 6
ST Depression (mm): 0 mm
Stress Nuclear Isotope Dose: 32.1 mCi
TID: 1.06

## 2022-10-15 MED ORDER — REGADENOSON 0.4 MG/5ML IV SOLN
0.4000 mg | Freq: Once | INTRAVENOUS | Status: AC
  Administered 2022-10-15: 0.4 mg via INTRAVENOUS

## 2022-10-15 MED ORDER — TECHNETIUM TC 99M TETROFOSMIN IV KIT
32.1000 | PACK | Freq: Once | INTRAVENOUS | Status: AC | PRN
  Administered 2022-10-15: 32.1 via INTRAVENOUS

## 2022-10-15 MED ORDER — TECHNETIUM TC 99M TETROFOSMIN IV KIT
10.6000 | PACK | Freq: Once | INTRAVENOUS | Status: AC | PRN
  Administered 2022-10-15: 10.6 via INTRAVENOUS

## 2022-10-20 ENCOUNTER — Telehealth: Payer: Self-pay

## 2022-10-20 NOTE — Telephone Encounter (Signed)
Patient notified

## 2022-10-20 NOTE — Telephone Encounter (Signed)
-----   Message from Georgeanna Lea, MD sent at 10/19/2022 12:21 PM EDT ----- Stress test mildly abnormal patient need to have a follow-up to discuss options

## 2022-10-20 NOTE — Telephone Encounter (Signed)
Patient notified of results, schedule on Thursday.

## 2022-10-22 ENCOUNTER — Ambulatory Visit: Payer: Medicare PPO | Attending: Cardiology | Admitting: Cardiology

## 2022-10-22 ENCOUNTER — Encounter: Payer: Self-pay | Admitting: Cardiology

## 2022-10-22 VITALS — BP 126/70 | HR 74 | Ht 69.0 in | Wt 190.0 lb

## 2022-10-22 DIAGNOSIS — I2510 Atherosclerotic heart disease of native coronary artery without angina pectoris: Secondary | ICD-10-CM | POA: Diagnosis not present

## 2022-10-22 DIAGNOSIS — E782 Mixed hyperlipidemia: Secondary | ICD-10-CM | POA: Diagnosis not present

## 2022-10-22 DIAGNOSIS — G4733 Obstructive sleep apnea (adult) (pediatric): Secondary | ICD-10-CM

## 2022-10-22 NOTE — Progress Notes (Signed)
Cardiology Office Note:    Date:  10/22/2022   ID:  [name], DOB June 15, 1940, MRN [id number]  PCP:  [name], MD  Cardiologist:  [name], MD    Referring MD: [name], MD   Chief Complaint  Patient presents with   Results    Stress Test    History of Present Illness:    [name] is a 82 y.o. male past medical history significant for coronary artery disease.  He did have coronary bypass graft done in 2006, recently had a stress test done which showed ischemia involving inferior wall.  Additional problem include dyslipidemia, obstructive sleep apnea on CPAP, essential hypertension, history of cardiomyopathy however last echocardiogram showed improvement left ventricle ejection fraction with normalization. Comes today to talk about his abnormal stress test.  Since I seen him last time I put him on Imdur 30 he did have headache for 3 days but now takes this with no difficulties however he lives very sedentary lifestyle he is jokingly tell me that speed is not his best attribute.  He did not do much since the time I seen him last time he did not have any chest pain.  Past Medical History:  Diagnosis Date   Anxiety    ANXIETY 07/28/2010   Qualifier: Diagnosis of  By: [name] LPN, [name]     Arthritis    neck; limited neck movement   Branch retinal vein occlusion with macular edema of left eye 09/05/2019   Branch retinal vein occlusion with macular edema of right eye 09/05/2019   Chronic cholecystitis 05/13/2020   Chronic rhinitis    Coronary artery disease    Coronary artery disease involving native coronary artery of native heart 05/11/2016   Depression    no treatment since he retired   DEPRESSION 07/28/2010   Qualifier: Diagnosis of  By: [name] LPN, [name]     Dyspnea    with exertion   Essential hypertension 07/28/2010   Qualifier: Diagnosis of  By: [name] LPN, [name]     Essential tremor 09/07/2017   Gallbladder sludge 05/13/2020   GERD  (gastroesophageal reflux disease)    Headache    hx subdural hematoma   Hyperlipidemia    HYPERLIPIDEMIA 07/28/2010   Qualifier: Diagnosis of  By: [name] LPN, [name]     Hypertension    Hypothyroidism    Intermediate stage nonexudative age-related macular degeneration of right eye 09/05/2019   Ischemic cardiomyopathy 05/11/2016   OBSTRUCTIVE SLEEP APNEA 07/29/2010   NPSG 2006:  AHI 18/hr. Pressure optimized to 12cm by auto device 2012     OSA (obstructive sleep apnea)    Posterior vitreous detachment of left eye 09/05/2019   Posterior vitreous detachment of right eye 09/05/2019   Postoperative examination 06/10/2020   RESTLESS LEGS SYNDROME 07/29/2010   Trial of requip 2012:  +intolerance, didn't feel it made a difference    Status post coronary artery bypass graft 05/11/2016   Overview:  2005  Formatting of this note might be different from the original. 2005   Stroke [facility]) ~27yrs ago   mild affects Lt eye   Subdural hematoma (HCC) 2004    Past Surgical History:  Procedure Laterality Date   APPENDECTOMY  1970s   BRAIN HEMATOMA EVACUATION  ~2005   subdural hematoma   CARPAL TUNNEL RELEASE Right 05/21/2016   Procedure: CARPAL TUNNEL RELEASE, right;  Surgeon: Cindee Salt, MD;  Location: [facility];  Service: Orthopedics;  Laterality: Right;  FAB  CATARACT EXTRACTION Left    CORONARY ARTERY BYPASS GRAFT  ~2006   gall bladder removed   05/2020   Heart bypass  2005   ROTATOR CUFF REPAIR Right ~4 years    Current Medications: Current Meds  Medication Sig   Aflibercept (EYLEA HD IZ) Inject 1 each as directed See admin instructions. Q6 weeks both eyes   aspirin 81 MG tablet Take 81 mg by mouth daily.   carvedilol (COREG) 6.25 MG tablet Take 6.25 mg by mouth 2 (two) times daily.   Cholecalciferol (VITAMIN D) 1000 UNITS capsule Take 1,000 Units by mouth daily.   Coenzyme Q10 300 MG CAPS Take 1 capsule by mouth daily.   Cyanocobalamin (B-12) 1000 MCG CAPS Take 1 tablet by  mouth daily.    finasteride (PROSCAR) 5 MG tablet Take 5 mg by mouth daily.   isosorbide mononitrate (IMDUR) 30 MG 24 hr tablet Take 1 tablet (30 mg total) by mouth daily.   levothyroxine (SYNTHROID, LEVOTHROID) 75 MCG tablet Take 1 tablet by mouth daily.   Misc Natural Products (PROSTATE HEALTH PO) Take 1 tablet by mouth daily. Unknown strength per patient   Multiple Vitamin (MULTIVITAMIN) tablet Take 2 tablets by mouth daily. Unknown strength   nitroGLYCERIN (NITROSTAT) 0.4 MG SL tablet Place 1 tablet (0.4 mg total) under the tongue every 5 (five) minutes as needed for chest pain.   Omega-3 Fatty Acids (FISH OIL) 1200 MG CAPS Take 1,200 mg by mouth 2 (two) times daily.    OXYGEN Inhale 2 mLs into the lungs at bedtime.   rosuvastatin (CRESTOR) 5 MG tablet Take 1 tablet by mouth daily.     Allergies:   Patient has no known allergies.   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 ROS:   Please see the history of present illness.    All 14 point review of systems negative except as  described per history of present illness  EKGs/Labs/Other Studies Reviewed:      Recent Labs: No results found for requested labs within last 365 days.  Recent Lipid Panel No results found for: "CHOL", "TRIG", "HDL", "CHOLHDL", "VLDL", "LDLCALC", "LDLDIRECT"  Physical Exam:    VS:  BP 126/70 (BP Location: Left Arm, Patient Position: Sitting)   Pulse 74   Ht 5\' 9"  (1.753 m)   Wt 190 lb (86.2 kg)   SpO2 96%   BMI 28.06 kg/m  Wt Readings from Last 3 Encounters:  10/22/22 190 lb (86.2 kg)  10/15/22 188 lb (85.3 kg)  10/09/22 188 lb 6.4 oz (85.5 kg)     GEN:  Well nourished, well developed in no acute distress HEENT: Normal NECK: No JVD; No carotid bruits LYMPHATICS: No lymphadenopathy CARDIAC: RRR, no murmurs, no rubs, no gallops RESPIRATORY:  Clear to auscultation without rales, wheezing or rhonchi  ABDOMEN: Soft, non-tender, non-distended MUSCULOSKELETAL:  No edema; No deformity  SKIN: Warm and dry LOWER EXTREMITIES: no swelling NEUROLOGIC:  Alert and oriented x 3 PSYCHIATRIC:  Normal affect   ASSESSMENT:    1. Coronary artery disease involving native coronary artery of native heart without angina pectoris   2. OSA (obstructive sleep apnea)   3. Mixed hyperlipidemia    PLAN:    In order of problems listed above:  Coronary disease abnormal stress test showing inferior wall ischemia.  We spent great left time talking about it I told him to push himself a little bit to see if he still have any recurrent chest pain.  If we control his symptoms with medications I think we should be fine.  Indication for his situation to pursue cardiac catheterization intervention will be inability to control his symptoms with medication so far he does not have any but again he does not do much.  Will continue antiplatelet therapy will continue antianginal therapy. Obstructive sleep apnea followed by antimedicine team, on CPAP. Dyslipidemia I did review his K PN which show me data from  last year with LDL of 59 HDL 38.  Will continue present management which include Crestor 5. Apparently some question about him using losartan, for renal protective effect.  From my point of view should be absolutely fine.  I do not have recent creatinine.  So we will call his primary care physician try to get it we will not get it   Medication Adjustments/Labs and Tests Ordered: Current medicines are reviewed at length with the patient today.  Concerns regarding medicines are outlined above.  No orders of the defined types were placed in this encounter.  Medication changes: No orders of the defined types were placed in this encounter.   Signed, Georgeanna Lea, MD, [facility] 10/22/2022 1:30 PM    [facility]

## 2022-10-22 NOTE — Addendum Note (Signed)
Addended by: Baldo Ash D on: 10/22/2022 01:45 PM   Modules accepted: Orders

## 2022-10-22 NOTE — Patient Instructions (Signed)
Medication Instructions:  Your physician recommends that you continue on your current medications as directed. Please refer to the Current Medication list given to you today.  *If you need a refill on your cardiac medications before your next appointment, please call your pharmacy*   Lab Work: BMP- today If you have labs (blood work) drawn today and your tests are completely normal, you will receive your results only by: MyChart Message (if you have MyChart) OR A paper copy in the mail If you have any lab test that is abnormal or we need to change your treatment, we will call you to review the results.   Testing/Procedures: None Ordered   Follow-Up: At [facility], you and your health needs are our priority.  As part of our continuing mission to provide you with exceptional heart care, we have created designated Provider Care Teams.  These Care Teams include your primary Cardiologist (physician) and Advanced Practice Providers (APPs -  Physician Assistants and Nurse Practitioners) who all work together to provide you with the care you need, when you need it.  We recommend signing up for the patient portal called "MyChart".  Sign up information is provided on this After Visit Summary.  MyChart is used to connect with patients for Virtual Visits (Telemedicine).  Patients are able to view lab/test results, encounter notes, upcoming appointments, etc.  Non-urgent messages can be sent to your provider as well.   To learn more about what you can do with MyChart, go to [email or web address].    Your next appointment:   4 month(s)  The format for your next appointment:   In Person  Provider:   [name], MD    Other Instructions NA

## 2022-10-23 LAB — BASIC METABOLIC PANEL
BUN/Creatinine Ratio: 13 (ref 10–24)
BUN: 25 mg/dL (ref 8–27)
CO2: 18 mmol/L — ABNORMAL LOW (ref 20–29)
Calcium: 9.2 mg/dL (ref 8.6–10.2)
Chloride: 103 mmol/L (ref 96–106)
Creatinine, Ser: 1.94 mg/dL — ABNORMAL HIGH (ref 0.76–1.27)
Glucose: 115 mg/dL — ABNORMAL HIGH (ref 70–99)
Potassium: 4.7 mmol/L (ref 3.5–5.2)
Sodium: 136 mmol/L (ref 134–144)
eGFR: 34 mL/min/{1.73_m2} — ABNORMAL LOW (ref >59)

## 2022-10-29 ENCOUNTER — Telehealth: Payer: Self-pay

## 2022-10-29 NOTE — Telephone Encounter (Signed)
Spoke to [place], notified of results and recommendations. Lab results forward to Dr. Sudie Bailey office.

## 2022-10-29 NOTE — Telephone Encounter (Signed)
-----   Message from Georgeanna Lea, MD sent at 10/29/2022  9:49 AM EDT ----- Labs showing worsening of kidney function as compared to 3 years ago, I will not change any medication right now

## 2022-11-10 DIAGNOSIS — H353112 Nonexudative age-related macular degeneration, right eye, intermediate dry stage: Secondary | ICD-10-CM | POA: Diagnosis not present

## 2022-11-10 DIAGNOSIS — H34831 Tributary (branch) retinal vein occlusion, right eye, with macular edema: Secondary | ICD-10-CM | POA: Diagnosis not present

## 2022-11-10 DIAGNOSIS — H43812 Vitreous degeneration, left eye: Secondary | ICD-10-CM | POA: Diagnosis not present

## 2022-11-10 DIAGNOSIS — H34832 Tributary (branch) retinal vein occlusion, left eye, with macular edema: Secondary | ICD-10-CM | POA: Diagnosis not present

## 2022-11-10 DIAGNOSIS — H43813 Vitreous degeneration, bilateral: Secondary | ICD-10-CM | POA: Diagnosis not present

## 2022-11-10 DIAGNOSIS — H43811 Vitreous degeneration, right eye: Secondary | ICD-10-CM | POA: Diagnosis not present

## 2022-11-12 DIAGNOSIS — H43812 Vitreous degeneration, left eye: Secondary | ICD-10-CM | POA: Diagnosis not present

## 2022-11-12 DIAGNOSIS — H34831 Tributary (branch) retinal vein occlusion, right eye, with macular edema: Secondary | ICD-10-CM | POA: Diagnosis not present

## 2022-11-12 DIAGNOSIS — H43811 Vitreous degeneration, right eye: Secondary | ICD-10-CM | POA: Diagnosis not present

## 2022-11-12 DIAGNOSIS — H43813 Vitreous degeneration, bilateral: Secondary | ICD-10-CM | POA: Diagnosis not present

## 2022-11-12 DIAGNOSIS — H34833 Tributary (branch) retinal vein occlusion, bilateral, with macular edema: Secondary | ICD-10-CM | POA: Diagnosis not present

## 2022-11-12 DIAGNOSIS — H353112 Nonexudative age-related macular degeneration, right eye, intermediate dry stage: Secondary | ICD-10-CM | POA: Diagnosis not present

## 2022-11-25 DIAGNOSIS — R972 Elevated prostate specific antigen [PSA]: Secondary | ICD-10-CM | POA: Diagnosis not present

## 2022-11-25 DIAGNOSIS — C61 Malignant neoplasm of prostate: Secondary | ICD-10-CM | POA: Diagnosis not present

## 2022-11-25 DIAGNOSIS — N401 Enlarged prostate with lower urinary tract symptoms: Secondary | ICD-10-CM | POA: Diagnosis not present

## 2022-12-07 DIAGNOSIS — Z79899 Other long term (current) drug therapy: Secondary | ICD-10-CM | POA: Diagnosis not present

## 2022-12-07 DIAGNOSIS — I25119 Atherosclerotic heart disease of native coronary artery with unspecified angina pectoris: Secondary | ICD-10-CM | POA: Diagnosis not present

## 2022-12-07 DIAGNOSIS — I10 Essential (primary) hypertension: Secondary | ICD-10-CM | POA: Diagnosis not present

## 2022-12-07 DIAGNOSIS — Z6831 Body mass index (BMI) 31.0-31.9, adult: Secondary | ICD-10-CM | POA: Diagnosis not present

## 2022-12-07 DIAGNOSIS — E785 Hyperlipidemia, unspecified: Secondary | ICD-10-CM | POA: Diagnosis not present

## 2022-12-07 DIAGNOSIS — E039 Hypothyroidism, unspecified: Secondary | ICD-10-CM | POA: Diagnosis not present

## 2022-12-07 DIAGNOSIS — R809 Proteinuria, unspecified: Secondary | ICD-10-CM | POA: Diagnosis not present

## 2022-12-07 DIAGNOSIS — R7301 Impaired fasting glucose: Secondary | ICD-10-CM | POA: Diagnosis not present

## 2022-12-22 DIAGNOSIS — H3530 Unspecified macular degeneration: Secondary | ICD-10-CM | POA: Diagnosis not present

## 2022-12-22 DIAGNOSIS — E039 Hypothyroidism, unspecified: Secondary | ICD-10-CM | POA: Diagnosis not present

## 2022-12-22 DIAGNOSIS — R32 Unspecified urinary incontinence: Secondary | ICD-10-CM | POA: Diagnosis not present

## 2022-12-22 DIAGNOSIS — N189 Chronic kidney disease, unspecified: Secondary | ICD-10-CM | POA: Diagnosis not present

## 2022-12-22 DIAGNOSIS — M1990 Unspecified osteoarthritis, unspecified site: Secondary | ICD-10-CM | POA: Diagnosis not present

## 2022-12-22 DIAGNOSIS — N529 Male erectile dysfunction, unspecified: Secondary | ICD-10-CM | POA: Diagnosis not present

## 2022-12-22 DIAGNOSIS — G4733 Obstructive sleep apnea (adult) (pediatric): Secondary | ICD-10-CM | POA: Diagnosis not present

## 2022-12-22 DIAGNOSIS — M810 Age-related osteoporosis without current pathological fracture: Secondary | ICD-10-CM | POA: Diagnosis not present

## 2022-12-22 DIAGNOSIS — E785 Hyperlipidemia, unspecified: Secondary | ICD-10-CM | POA: Diagnosis not present

## 2022-12-22 DIAGNOSIS — K219 Gastro-esophageal reflux disease without esophagitis: Secondary | ICD-10-CM | POA: Diagnosis not present

## 2023-01-05 DIAGNOSIS — H34833 Tributary (branch) retinal vein occlusion, bilateral, with macular edema: Secondary | ICD-10-CM | POA: Diagnosis not present

## 2023-01-05 DIAGNOSIS — H43813 Vitreous degeneration, bilateral: Secondary | ICD-10-CM | POA: Diagnosis not present

## 2023-01-05 DIAGNOSIS — H353112 Nonexudative age-related macular degeneration, right eye, intermediate dry stage: Secondary | ICD-10-CM | POA: Diagnosis not present

## 2023-02-24 DIAGNOSIS — C61 Malignant neoplasm of prostate: Secondary | ICD-10-CM | POA: Diagnosis not present

## 2023-02-24 DIAGNOSIS — N401 Enlarged prostate with lower urinary tract symptoms: Secondary | ICD-10-CM | POA: Diagnosis not present

## 2023-02-24 DIAGNOSIS — N529 Male erectile dysfunction, unspecified: Secondary | ICD-10-CM | POA: Diagnosis not present

## 2023-02-24 DIAGNOSIS — N289 Disorder of kidney and ureter, unspecified: Secondary | ICD-10-CM | POA: Diagnosis not present

## 2023-02-24 DIAGNOSIS — N3289 Other specified disorders of bladder: Secondary | ICD-10-CM | POA: Diagnosis not present

## 2023-02-24 DIAGNOSIS — R972 Elevated prostate specific antigen [PSA]: Secondary | ICD-10-CM | POA: Diagnosis not present

## 2023-02-25 ENCOUNTER — Encounter: Payer: Self-pay | Admitting: Cardiology

## 2023-02-25 ENCOUNTER — Ambulatory Visit: Payer: Medicare PPO | Attending: Cardiology | Admitting: Cardiology

## 2023-02-25 VITALS — BP 118/70 | HR 74 | Ht 70.0 in | Wt 189.0 lb

## 2023-02-25 DIAGNOSIS — Z951 Presence of aortocoronary bypass graft: Secondary | ICD-10-CM

## 2023-02-25 DIAGNOSIS — E785 Hyperlipidemia, unspecified: Secondary | ICD-10-CM

## 2023-02-25 DIAGNOSIS — I10 Essential (primary) hypertension: Secondary | ICD-10-CM | POA: Diagnosis not present

## 2023-02-25 DIAGNOSIS — I255 Ischemic cardiomyopathy: Secondary | ICD-10-CM

## 2023-02-25 DIAGNOSIS — I25118 Atherosclerotic heart disease of native coronary artery with other forms of angina pectoris: Secondary | ICD-10-CM

## 2023-02-25 MED ORDER — AMLODIPINE BESYLATE 2.5 MG PO TABS: 2.5000 mg | ORAL_TABLET | Freq: Every day | ORAL | 3 refills | Status: DC

## 2023-02-25 NOTE — Progress Notes (Signed)
Cardiology Office Note:    Date:  02/25/2023   ID:  [name], DOB Dec 26, 1940, MRN [id number]  PCP:  [name], MD  Cardiologist:  [name], MD    Referring MD: [name], MD   Chief Complaint  Patient presents with   Medication Management    History of Present Illness:    [name] is a 82 y.o. male  past medical history significant for coronary artery disease. He did have coronary bypass graft done in 2006, recently had a stress test done which showed ischemia involving inferior wall. Additional problem include dyslipidemia, obstructive sleep apnea on CPAP, essential hypertension, history of cardiomyopathy however last echocardiogram showed improvement left ventricle ejection fraction with normalization.  Comes today to months for follow-up.  He was put on Imdur 30 said he is feeling better in terms of chest pain but just feels lousy and have no energy sometimes when he gets up he gets very dizzy.  So he does not like this medication.  Past Medical History:  Diagnosis Date   Anxiety    ANXIETY 07/28/2010   Qualifier: Diagnosis of  By: [name] LPN, [name]     Arthritis    neck; limited neck movement   Branch retinal vein occlusion with macular edema of left eye 09/05/2019   Branch retinal vein occlusion with macular edema of right eye 09/05/2019   Chronic cholecystitis 05/13/2020   Chronic rhinitis    Coronary artery disease    Coronary artery disease involving native coronary artery of native heart 05/11/2016   Depression    no treatment since he retired   DEPRESSION 07/28/2010   Qualifier: Diagnosis of  By: [name] LPN, [name]     Dyspnea    with exertion   Essential hypertension 07/28/2010   Qualifier: Diagnosis of  By: [name] LPN, [name]     Essential tremor 09/07/2017   Gallbladder sludge 05/13/2020   GERD (gastroesophageal reflux disease)    Headache    hx subdural hematoma   Hyperlipidemia    HYPERLIPIDEMIA 07/28/2010   Qualifier:  Diagnosis of  By: [name] LPN, [name]     Hypertension    Hypothyroidism    Intermediate stage nonexudative age-related macular degeneration of right eye 09/05/2019   Ischemic cardiomyopathy 05/11/2016   OBSTRUCTIVE SLEEP APNEA 07/29/2010   NPSG 2006:  AHI 18/hr. Pressure optimized to 12cm by auto device 2012     OSA (obstructive sleep apnea)    Posterior vitreous detachment of left eye 09/05/2019   Posterior vitreous detachment of right eye 09/05/2019   Postoperative examination 06/10/2020   RESTLESS LEGS SYNDROME 07/29/2010   Trial of requip 2012:  +intolerance, didn't feel it made a difference    Status post coronary artery bypass graft 05/11/2016   Overview:  2005  Formatting of this note might be different from the original. 2005   Stroke [facility]) ~24yrs ago   mild affects Lt eye   Subdural hematoma (HCC) 2004    Past Surgical History:  Procedure Laterality Date   APPENDECTOMY  1970s   BRAIN HEMATOMA EVACUATION  ~2005   subdural hematoma   CARPAL TUNNEL RELEASE Right 05/21/2016   Procedure: CARPAL TUNNEL RELEASE, right;  Surgeon: Cindee Salt, MD;  Location: [facility];  Service: Orthopedics;  Laterality: Right;  FAB   CATARACT EXTRACTION Left    CORONARY ARTERY BYPASS GRAFT  ~2006   gall bladder removed   05/2020   Heart bypass  2005   ROTATOR CUFF REPAIR  Right ~4 years    Current Medications: Current Meds  Medication Sig   Aflibercept (EYLEA HD IZ) Inject 1 each as directed See admin instructions. Q6 weeks both eyes   aspirin 81 MG tablet Take 81 mg by mouth daily.   carvedilol (COREG) 6.25 MG tablet Take 6.25 mg by mouth 2 (two) times daily.   Cholecalciferol (VITAMIN D) 1000 UNITS capsule Take 1,000 Units by mouth daily.   Coenzyme Q10 300 MG CAPS Take 1 capsule by mouth daily.   Cyanocobalamin (B-12) 1000 MCG CAPS Take 1 tablet by mouth daily.    finasteride (PROSCAR) 5 MG tablet Take 5 mg by mouth daily.   isosorbide mononitrate (IMDUR) 30 MG 24 hr tablet Take  1 tablet (30 mg total) by mouth daily.   levothyroxine (SYNTHROID, LEVOTHROID) 75 MCG tablet Take 1 tablet by mouth daily.   Misc Natural Products (PROSTATE HEALTH PO) Take 1 tablet by mouth daily. Unknown strength per patient   Multiple Vitamin (MULTIVITAMIN) tablet Take 2 tablets by mouth daily. Unknown strength   nitroGLYCERIN (NITROSTAT) 0.4 MG SL tablet Place 1 tablet (0.4 mg total) under the tongue every 5 (five) minutes as needed for chest pain.   Omega-3 Fatty Acids (FISH OIL) 1200 MG CAPS Take 1,200 mg by mouth 2 (two) times daily.    OXYGEN Inhale 2 mLs into the lungs at bedtime.   rosuvastatin (CRESTOR) 5 MG tablet Take 1 tablet by mouth daily.   silodosin (RAPAFLO) 4 MG CAPS capsule Take 4 mg by mouth daily with breakfast.     Allergies:   Patient has no known allergies.   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 ROS:   Please see the history of present illness.    All 14 point review of systems negative except as described per history of present  illness  EKGs/Labs/Other Studies Reviewed:         Recent Labs: 10/22/2022: BUN 25; Creatinine, Ser 1.94; Potassium 4.7; Sodium 136  Recent Lipid Panel No results found for: "CHOL", "TRIG", "HDL", "CHOLHDL", "VLDL", "LDLCALC", "LDLDIRECT"  Physical Exam:    VS:  BP 118/70 (BP Location: Left Arm, Patient Position: Sitting)   Pulse 74   Ht 5\' 10"  (1.778 m)   Wt 189 lb (85.7 kg)   SpO2 97%   BMI 27.12 kg/m     Wt Readings from Last 3 Encounters:  02/25/23 189 lb (85.7  kg)  10/22/22 190 lb (86.2 kg)  10/15/22 188 lb (85.3 kg)     GEN:  Well nourished, well developed in no acute distress HEENT: Normal NECK: No JVD; No carotid bruits LYMPHATICS: No lymphadenopathy CARDIAC: RRR, no murmurs, no rubs, no gallops RESPIRATORY:  Clear to auscultation without rales, wheezing or rhonchi  ABDOMEN: Soft, non-tender, non-distended MUSCULOSKELETAL:  No edema; No deformity  SKIN: Warm and dry LOWER EXTREMITIES: no swelling NEUROLOGIC:  Alert and oriented x 3 PSYCHIATRIC:  Normal affect   ASSESSMENT:    1. Coronary artery disease of native artery of native heart with stable angina pectoris (HCC)   2. Essential hypertension   3. Ischemic cardiomyopathy   4. Dyslipidemia (high LDL; low HDL)   5. Status post coronary artery bypass graft    PLAN:    In order of problems listed above:  Coronary disease stable 2 Angina pectoris try to find appropriate medications for him which is somewhat difficult.  I will stop his Imdur today and put him on amlodipine 2.5 g daily see if it helps. 3. History of ischemic cardiomyopathy: Last echocardiogram showed ejection fraction of 55 to 60%.  Will continue present management. 4.Dyslipidemia: I do not have the recent fasting lipid profile last numbers have is from last year which show LDL of 59 HDL 38.  I will ask him to have fasting blood profile done   Medication Adjustments/Labs and Tests Ordered: Current medicines are reviewed at length with  the patient today.  Concerns regarding medicines are outlined above.  No orders of the defined types were placed in this encounter.  Medication changes: No orders of the defined types were placed in this encounter.   Signed, Georgeanna Lea, MD, [facility] 02/25/2023 1:30 PM    [facility]

## 2023-02-25 NOTE — Addendum Note (Signed)
Addended by: Baldo Ash D on: 02/25/2023 01:36 PM   Modules accepted: Orders

## 2023-02-25 NOTE — Patient Instructions (Signed)
Medication Instructions:   STOP: Isosorbide Mononitrate- Imdur  START: Amlodipine 2.5mg  1 tablet daily   Lab Work: None Ordered If you have labs (blood work) drawn today and your tests are completely normal, you will receive your results only by: MyChart Message (if you have MyChart) OR A paper copy in the mail If you have any lab test that is abnormal or we need to change your treatment, we will call you to review the results.   Testing/Procedures: None Ordered   Follow-Up: At [facility], you and your health needs are our priority.  As part of our continuing mission to provide you with exceptional heart care, we have created designated Provider Care Teams.  These Care Teams include your primary Cardiologist (physician) and Advanced Practice Providers (APPs -  Physician Assistants and Nurse Practitioners) who all work together to provide you with the care you need, when you need it.  We recommend signing up for the patient portal called "MyChart".  Sign up information is provided on this After Visit Summary.  MyChart is used to connect with patients for Virtual Visits (Telemedicine).  Patients are able to view lab/test results, encounter notes, upcoming appointments, etc.  Non-urgent messages can be sent to your provider as well.   To learn more about what you can do with MyChart, go to [email or web address].    Your next appointment:   6 week(s)  The format for your next appointment:   In Person  Provider:   [name], MD    Other Instructions NA

## 2023-03-09 DIAGNOSIS — H43813 Vitreous degeneration, bilateral: Secondary | ICD-10-CM | POA: Diagnosis not present

## 2023-03-09 DIAGNOSIS — H353112 Nonexudative age-related macular degeneration, right eye, intermediate dry stage: Secondary | ICD-10-CM | POA: Diagnosis not present

## 2023-03-09 DIAGNOSIS — H34833 Tributary (branch) retinal vein occlusion, bilateral, with macular edema: Secondary | ICD-10-CM | POA: Diagnosis not present

## 2023-03-10 DIAGNOSIS — Z6831 Body mass index (BMI) 31.0-31.9, adult: Secondary | ICD-10-CM | POA: Diagnosis not present

## 2023-03-10 DIAGNOSIS — I10 Essential (primary) hypertension: Secondary | ICD-10-CM | POA: Diagnosis not present

## 2023-03-10 DIAGNOSIS — I25119 Atherosclerotic heart disease of native coronary artery with unspecified angina pectoris: Secondary | ICD-10-CM | POA: Diagnosis not present

## 2023-03-10 DIAGNOSIS — N401 Enlarged prostate with lower urinary tract symptoms: Secondary | ICD-10-CM | POA: Diagnosis not present

## 2023-03-10 DIAGNOSIS — Z79899 Other long term (current) drug therapy: Secondary | ICD-10-CM | POA: Diagnosis not present

## 2023-03-29 DIAGNOSIS — C61 Malignant neoplasm of prostate: Secondary | ICD-10-CM | POA: Diagnosis not present

## 2023-03-29 DIAGNOSIS — K5900 Constipation, unspecified: Secondary | ICD-10-CM | POA: Diagnosis not present

## 2023-03-29 DIAGNOSIS — R1084 Generalized abdominal pain: Secondary | ICD-10-CM | POA: Diagnosis not present

## 2023-03-29 DIAGNOSIS — R109 Unspecified abdominal pain: Secondary | ICD-10-CM | POA: Diagnosis not present

## 2023-04-08 ENCOUNTER — Encounter: Payer: Self-pay | Admitting: Cardiology

## 2023-04-08 ENCOUNTER — Ambulatory Visit: Payer: Medicare PPO | Attending: Cardiology | Admitting: Cardiology

## 2023-04-08 VITALS — BP 134/74 | HR 69 | Ht 70.0 in | Wt 183.8 lb

## 2023-04-08 DIAGNOSIS — E785 Hyperlipidemia, unspecified: Secondary | ICD-10-CM

## 2023-04-08 DIAGNOSIS — G4733 Obstructive sleep apnea (adult) (pediatric): Secondary | ICD-10-CM

## 2023-04-08 DIAGNOSIS — I2510 Atherosclerotic heart disease of native coronary artery without angina pectoris: Secondary | ICD-10-CM

## 2023-04-08 DIAGNOSIS — I255 Ischemic cardiomyopathy: Secondary | ICD-10-CM | POA: Diagnosis not present

## 2023-04-08 DIAGNOSIS — Z951 Presence of aortocoronary bypass graft: Secondary | ICD-10-CM | POA: Diagnosis not present

## 2023-04-08 MED ORDER — TADALAFIL 2.5 MG PO TABS: 1.0000 | ORAL_TABLET | Freq: Every day | ORAL | 3 refills | Status: DC

## 2023-04-08 NOTE — Progress Notes (Signed)
Cardiology Office Note:    Date:  04/08/2023   ID:  [name], DOB July 18, 1940, MRN [id number]  PCP:  [name], MD  Cardiologist:  [name], MD    Referring MD: [name], MD   Chief Complaint  Patient presents with   Medication Management    Amlodipine    History of Present Illness:    [name] is a 82 y.o. male past medical history significant for coronary artery disease.  Status post coronary bypass grafting 2006 recently he had a stress test done which showed inferior wall ischemia after that long discussion about what to do he was barely symptomatic we elected to proceed with medical therapy.  Additional problem include obstructive sleep apnea on CPAP, essential hypertension, history of cardiomyopathy however normalization based on last echocardiogram.  He comes today to my office for follow-up I tried to put him on Imdur he could not tolerate it I put him on amlodipine 2.5 daily and he complained of constipation he stopped the medication constipation went away however at the same time he is doing quite well.  He said he exercised on the treadmill on the regular basis walking 1 mile, yesterday [place] and have no difficulty doing it.  I think we can conclude that he is probably asymptomatic.  He does have probably the prostate and there is a question about potentially doing cysts using Cialis from my standpoint review showed me a problem he does not take nitroglycerin and warned him about the fact that he cannot take nitroglycerin with this medication.  And his blood pressure is quite well-controlled  Past Medical History:  Diagnosis Date   Anxiety    ANXIETY 07/28/2010   Qualifier: Diagnosis of  By: [name] LPN, [name]     Arthritis    neck; limited neck movement   Branch retinal vein occlusion with macular edema of left eye 09/05/2019   Branch retinal vein occlusion with macular edema of right eye 09/05/2019   Chronic  cholecystitis 05/13/2020   Chronic rhinitis    Coronary artery disease    Coronary artery disease involving native coronary artery of native heart 05/11/2016   Depression    no treatment since he retired   DEPRESSION 07/28/2010   Qualifier: Diagnosis of  By: [name] LPN, [name]     Dyspnea    with exertion   Essential hypertension 07/28/2010   Qualifier: Diagnosis of  By: [name] LPN, [name]     Essential tremor 09/07/2017   Gallbladder sludge 05/13/2020   GERD (gastroesophageal reflux disease)    Headache    hx subdural hematoma   Hyperlipidemia    HYPERLIPIDEMIA 07/28/2010   Qualifier: Diagnosis of  By: [name] LPN, [name]     Hypertension    Hypothyroidism    Intermediate stage nonexudative age-related macular degeneration of right eye 09/05/2019   Ischemic cardiomyopathy 05/11/2016   OBSTRUCTIVE SLEEP APNEA 07/29/2010   NPSG 2006:  AHI 18/hr. Pressure optimized to 12cm by auto device 2012     OSA (obstructive sleep apnea)    Posterior vitreous detachment of left eye 09/05/2019   Posterior vitreous detachment of right eye 09/05/2019   Postoperative examination 06/10/2020   RESTLESS LEGS SYNDROME 07/29/2010   Trial of requip 2012:  +intolerance, didn't feel it made a difference    Status post coronary artery bypass graft 05/11/2016   Overview:  2005  Formatting of this note might be different from the original. 2005  Stroke [facility]) ~71yrs ago   mild affects Lt eye   Subdural hematoma (HCC) 2004    Past Surgical History:  Procedure Laterality Date   APPENDECTOMY  1970s   BRAIN HEMATOMA EVACUATION  ~2005   subdural hematoma   CARPAL TUNNEL RELEASE Right 05/21/2016   Procedure: CARPAL TUNNEL RELEASE, right;  Surgeon: Cindee Salt, MD;  Location: [facility];  Service: Orthopedics;  Laterality: Right;  FAB   CATARACT EXTRACTION Left    CORONARY ARTERY BYPASS GRAFT  ~2006   gall bladder removed   05/2020   Heart bypass  2005   ROTATOR CUFF REPAIR Right ~4 years     Current Medications: Current Meds  Medication Sig   Aflibercept (EYLEA HD IZ) Inject 1 each as directed See admin instructions. Q6 weeks both eyes   aspirin 81 MG tablet Take 81 mg by mouth daily.   carvedilol (COREG) 6.25 MG tablet Take 6.25 mg by mouth 2 (two) times daily.   Cholecalciferol (VITAMIN D) 1000 UNITS capsule Take 1,000 Units by mouth daily.   Coenzyme Q10 300 MG CAPS Take 1 capsule by mouth daily.   Cyanocobalamin (B-12) 1000 MCG CAPS Take 1 tablet by mouth daily.    finasteride (PROSCAR) 5 MG tablet Take 5 mg by mouth daily.   levothyroxine (SYNTHROID, LEVOTHROID) 75 MCG tablet Take 1 tablet by mouth daily.   Misc Natural Products (PROSTATE HEALTH PO) Take 1 tablet by mouth daily. Unknown strength per patient   Multiple Vitamin (MULTIVITAMIN) tablet Take 2 tablets by mouth daily. Unknown strength   nitroGLYCERIN (NITROSTAT) 0.4 MG SL tablet Place 1 tablet (0.4 mg total) under the tongue every 5 (five) minutes as needed for chest pain.   Omega-3 Fatty Acids (FISH OIL) 1200 MG CAPS Take 1,200 mg by mouth 2 (two) times daily.    OXYGEN Inhale 2 mLs into the lungs at bedtime.   rosuvastatin (CRESTOR) 5 MG tablet Take 1 tablet by mouth daily.   silodosin (RAPAFLO) 4 MG CAPS capsule Take 4 mg by mouth daily with breakfast.   [DISCONTINUED] amLODipine (NORVASC) 2.5 MG tablet Take 1 tablet (2.5 mg total) by mouth daily.     Allergies:   Patient has no known allergies.   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 ROS:   Please see the history of present illness.    All 14 point review of systems negative  except as described per history of present illness  EKGs/Labs/Other Studies Reviewed:         Recent Labs: 10/22/2022: BUN 25; Creatinine, Ser 1.94; Potassium 4.7; Sodium 136  Recent Lipid Panel No results found for: "CHOL", "TRIG", "HDL", "CHOLHDL", "VLDL", "LDLCALC", "LDLDIRECT"  Physical Exam:    VS:  BP 134/74 (BP Location: Left Arm, Patient Position: Sitting)   Pulse 69   Ht 5\' 10"  (1.778 m)   Wt 183 lb 12.8 oz (83.4 kg)   SpO2 98%   BMI 26.37 kg/m     Wt Readings from Last 3 Encounters:  04/08/23 183 lb 12.8 oz (83.4 kg)  02/25/23 189 lb (85.7 kg)  10/22/22 190 lb (86.2 kg)     GEN:  Well nourished, well developed in no acute distress HEENT: Normal NECK: No JVD; No carotid bruits LYMPHATICS: No lymphadenopathy CARDIAC: RRR, no murmurs, no rubs, no gallops RESPIRATORY:  Clear to auscultation without rales, wheezing or rhonchi  ABDOMEN: Soft, non-tender, non-distended MUSCULOSKELETAL:  No edema; No deformity  SKIN: Warm and dry LOWER EXTREMITIES: no swelling NEUROLOGIC:  Alert and oriented x 3 PSYCHIATRIC:  Normal affect   ASSESSMENT:    1. Coronary artery disease involving native coronary artery of native heart without angina pectoris   2. Ischemic cardiomyopathy   3. OSA (obstructive sleep apnea)   4. Dyslipidemia (high LDL; low HDL)   5. Status post coronary artery bypass graft    PLAN:    In order of  problems listed above:  Coronary disease with abnormal stress test but patient seems to be doing quite well clinically, unable to tolerate medication but denies have any symptoms.  Will continue conservative approach with antiplatelets therapy. History of ischemic cardiomyopathy normalization based on last echocardiogram continue monitoring. Obstructive sleep apnea: Stable.  Denies having any issues with diet. Dyslipidemia I did review his K PN which show me his LDL of 59 HDL 38 this is from last year continue present management. Prostate issue.  I will give him prescription for Cialis 2.5 daily.  To see if that helps with his prostate problem.  That was suggested by Dr. Betsy Coder   Medication Adjustments/Labs and Tests Ordered: Current medicines are reviewed at length with the patient today.  Concerns regarding medicines are outlined above.  No orders of the defined types were placed in this encounter.  Medication changes: No orders of the defined types were placed in this encounter.   Signed, Georgeanna Lea, MD, [facility] 04/08/2023 9:48 AM    [facility]

## 2023-04-08 NOTE — Patient Instructions (Signed)
Medication Instructions:   START: Cialis 2.5mg  1 tablet daily   Lab Work: None Ordered If you have labs (blood work) drawn today and your tests are completely normal, you will receive your results only by: MyChart Message (if you have MyChart) OR A paper copy in the mail If you have any lab test that is abnormal or we need to change your treatment, we will call you to review the results.   Testing/Procedures: None Ordered   Follow-Up: At [facility], you and your health needs are our priority.  As part of our continuing mission to provide you with exceptional heart care, we have created designated Provider Care Teams.  These Care Teams include your primary Cardiologist (physician) and Advanced Practice Providers (APPs -  Physician Assistants and Nurse Practitioners) who all work together to provide you with the care you need, when you need it.  We recommend signing up for the patient portal called "MyChart".  Sign up information is provided on this After Visit Summary.  MyChart is used to connect with patients for Virtual Visits (Telemedicine).  Patients are able to view lab/test results, encounter notes, upcoming appointments, etc.  Non-urgent messages can be sent to your provider as well.   To learn more about what you can do with MyChart, go to [email or web address].    Your next appointment:   3 month(s)  The format for your next appointment:   In Person  Provider:   [name], MD    Other Instructions NA

## 2023-04-08 NOTE — Addendum Note (Signed)
Addended by: Baldo Ash D on: 04/08/2023 09:59 AM   Modules accepted: Orders

## 2023-04-15 DIAGNOSIS — Z6830 Body mass index (BMI) 30.0-30.9, adult: Secondary | ICD-10-CM | POA: Diagnosis not present

## 2023-04-15 DIAGNOSIS — I10 Essential (primary) hypertension: Secondary | ICD-10-CM | POA: Diagnosis not present

## 2023-04-15 DIAGNOSIS — Z1331 Encounter for screening for depression: Secondary | ICD-10-CM | POA: Diagnosis not present

## 2023-04-15 DIAGNOSIS — E785 Hyperlipidemia, unspecified: Secondary | ICD-10-CM | POA: Diagnosis not present

## 2023-04-15 DIAGNOSIS — N401 Enlarged prostate with lower urinary tract symptoms: Secondary | ICD-10-CM | POA: Diagnosis not present

## 2023-04-15 DIAGNOSIS — E039 Hypothyroidism, unspecified: Secondary | ICD-10-CM | POA: Diagnosis not present

## 2023-04-15 DIAGNOSIS — Z0000 Encounter for general adult medical examination without abnormal findings: Secondary | ICD-10-CM | POA: Diagnosis not present

## 2023-04-20 ENCOUNTER — Telehealth: Payer: Self-pay

## 2023-04-21 ENCOUNTER — Other Ambulatory Visit (HOSPITAL_COMMUNITY): Payer: Self-pay

## 2023-04-21 ENCOUNTER — Telehealth: Payer: Self-pay

## 2023-04-21 NOTE — Telephone Encounter (Signed)
PA request has been Submitted. New Encounter created for follow up. For additional info see Pharmacy Prior Auth telephone encounter from 04/21/23.

## 2023-04-21 NOTE — Telephone Encounter (Signed)
Pharmacy Patient Advocate Encounter   Received notification from Physician's Office that prior authorization for TADALAFIL is required/requested.   Insurance verification completed.   The patient is insured through [place] .   Per test claim: PA required; PA submitted to above mentioned insurance via CoverMyMeds Key/confirmation #/EOC [id number] Status is pending

## 2023-04-22 ENCOUNTER — Other Ambulatory Visit (HOSPITAL_COMMUNITY): Payer: Self-pay

## 2023-04-22 NOTE — Telephone Encounter (Signed)
Pharmacy Patient Advocate Encounter  Received notification from [facility] that Prior Authorization for TADALAFIL has been APPROVED from 06/01/22 to 05/31/24. Ran test claim, Copay is $16.50 FOR 90 DAYS. This test claim was processed through [facility]- copay amounts may vary at other pharmacies due to pharmacy/plan contracts, or as the patient moves through the different stages of their insurance plan.

## 2023-05-04 DIAGNOSIS — H43811 Vitreous degeneration, right eye: Secondary | ICD-10-CM | POA: Diagnosis not present

## 2023-05-04 DIAGNOSIS — H353112 Nonexudative age-related macular degeneration, right eye, intermediate dry stage: Secondary | ICD-10-CM | POA: Diagnosis not present

## 2023-05-04 DIAGNOSIS — H34833 Tributary (branch) retinal vein occlusion, bilateral, with macular edema: Secondary | ICD-10-CM | POA: Diagnosis not present

## 2023-05-04 DIAGNOSIS — H43812 Vitreous degeneration, left eye: Secondary | ICD-10-CM | POA: Diagnosis not present

## 2023-05-04 DIAGNOSIS — H43813 Vitreous degeneration, bilateral: Secondary | ICD-10-CM | POA: Diagnosis not present

## 2023-06-28 DIAGNOSIS — C61 Malignant neoplasm of prostate: Secondary | ICD-10-CM | POA: Diagnosis not present

## 2023-06-28 DIAGNOSIS — R972 Elevated prostate specific antigen [PSA]: Secondary | ICD-10-CM | POA: Diagnosis not present

## 2023-06-28 DIAGNOSIS — N289 Disorder of kidney and ureter, unspecified: Secondary | ICD-10-CM | POA: Diagnosis not present

## 2023-06-29 DIAGNOSIS — H43812 Vitreous degeneration, left eye: Secondary | ICD-10-CM | POA: Diagnosis not present

## 2023-06-29 DIAGNOSIS — I255 Ischemic cardiomyopathy: Secondary | ICD-10-CM | POA: Diagnosis not present

## 2023-06-29 DIAGNOSIS — I2510 Atherosclerotic heart disease of native coronary artery without angina pectoris: Secondary | ICD-10-CM | POA: Diagnosis not present

## 2023-06-29 DIAGNOSIS — H34833 Tributary (branch) retinal vein occlusion, bilateral, with macular edema: Secondary | ICD-10-CM | POA: Diagnosis not present

## 2023-06-29 DIAGNOSIS — H43813 Vitreous degeneration, bilateral: Secondary | ICD-10-CM | POA: Diagnosis not present

## 2023-06-29 DIAGNOSIS — H43811 Vitreous degeneration, right eye: Secondary | ICD-10-CM | POA: Diagnosis not present

## 2023-06-29 DIAGNOSIS — N1832 Chronic kidney disease, stage 3b: Secondary | ICD-10-CM | POA: Diagnosis not present

## 2023-06-29 DIAGNOSIS — H353112 Nonexudative age-related macular degeneration, right eye, intermediate dry stage: Secondary | ICD-10-CM | POA: Diagnosis not present

## 2023-06-29 DIAGNOSIS — I129 Hypertensive chronic kidney disease with stage 1 through stage 4 chronic kidney disease, or unspecified chronic kidney disease: Secondary | ICD-10-CM | POA: Diagnosis not present

## 2023-06-29 DIAGNOSIS — C61 Malignant neoplasm of prostate: Secondary | ICD-10-CM | POA: Diagnosis not present

## 2023-07-12 ENCOUNTER — Ambulatory Visit: Payer: Medicare PPO | Attending: Cardiology | Admitting: Cardiology

## 2023-07-12 ENCOUNTER — Encounter: Payer: Self-pay | Admitting: Cardiology

## 2023-07-12 VITALS — BP 134/78 | HR 65 | Ht 68.0 in | Wt 180.8 lb

## 2023-07-12 DIAGNOSIS — I10 Essential (primary) hypertension: Secondary | ICD-10-CM | POA: Diagnosis not present

## 2023-07-12 DIAGNOSIS — I2510 Atherosclerotic heart disease of native coronary artery without angina pectoris: Secondary | ICD-10-CM

## 2023-07-12 DIAGNOSIS — Z951 Presence of aortocoronary bypass graft: Secondary | ICD-10-CM

## 2023-07-12 DIAGNOSIS — I255 Ischemic cardiomyopathy: Secondary | ICD-10-CM | POA: Diagnosis not present

## 2023-07-12 DIAGNOSIS — E785 Hyperlipidemia, unspecified: Secondary | ICD-10-CM

## 2023-07-12 NOTE — Progress Notes (Signed)
 Cardiology Office Note:    Date:  07/12/2023   ID:  [name], DOB Oct 27, 1940, MRN [id number]  PCP:  Olan Bering, MD  Cardiologist:  Ralene Burger, MD    Referring MD: Olan Bering, MD   Chief Complaint  Patient presents with   Follow-up    History of Present Illness:    [name] is a 83 y.o. male past medical history significant for coronary artery disease, status post coronary bypass graft in 2006 last year he had a stress test done which shows some evidence of ischemia involving inferior wall, however he is completely asymptomatic decision has been made to pursue medical therapy.  He is also on CPAP mask does have history of essential hypertension dyslipidemia history of cardiomyopathy with normalization I tried to put him on amlodipine  2.5 but he complained of constipation and stopped that medication but again he is completely asymptomatic well.  Comes today to my office asymptomatic  Past Medical History:  Diagnosis Date   Anxiety    ANXIETY 07/28/2010   Qualifier: Diagnosis of  By: Maryjane Snider LPN, [name]     Arthritis    neck; limited neck movement   Branch retinal vein occlusion with macular edema of left eye 09/05/2019   Branch retinal vein occlusion with macular edema of right eye 09/05/2019   Chronic cholecystitis 05/13/2020   Chronic rhinitis    Coronary artery disease    Coronary artery disease involving native coronary artery of native heart 05/11/2016   Depression    no treatment since he retired   DEPRESSION 07/28/2010   Qualifier: Diagnosis of  By: Maryjane Snider LPN, [name]     Dyspnea    with exertion   Essential hypertension 07/28/2010   Qualifier: Diagnosis of  By: Maryjane Snider LPN, [name]     Essential tremor 09/07/2017   Gallbladder sludge 05/13/2020   GERD (gastroesophageal reflux disease)    Headache    hx subdural hematoma   Hyperlipidemia    HYPERLIPIDEMIA 07/28/2010   Qualifier: Diagnosis of  By: Maryjane Snider LPN, [name]     Hypertension     Hypothyroidism    Intermediate stage nonexudative age-related macular degeneration of right eye 09/05/2019   Ischemic cardiomyopathy 05/11/2016   OBSTRUCTIVE SLEEP APNEA 07/29/2010   NPSG 2006:  AHI 18/hr. Pressure optimized to 12cm by auto device 2012     OSA (obstructive sleep apnea)    Posterior vitreous detachment of left eye 09/05/2019   Posterior vitreous detachment of right eye 09/05/2019   Postoperative examination 06/10/2020   RESTLESS LEGS SYNDROME 07/29/2010   Trial of requip 2012:  +intolerance, didn't feel it made a difference    Status post coronary artery bypass graft 05/11/2016   Overview:  2005  Formatting of this note might be different from the original. 2005   Stroke [facility]) ~51yrs ago   mild affects Lt eye   Subdural hematoma (HCC) 2004    Past Surgical History:  Procedure Laterality Date   APPENDECTOMY  1970s   BRAIN HEMATOMA EVACUATION  ~2005   subdural hematoma   CARPAL TUNNEL RELEASE Right 05/21/2016   Procedure: CARPAL TUNNEL RELEASE, right;  Surgeon: Lyanne Sample, MD;  Location: [facility];  Service: Orthopedics;  Laterality: Right;  FAB   CATARACT EXTRACTION Left    CORONARY ARTERY BYPASS GRAFT  ~2006   gall bladder removed   05/2020   Heart bypass  2005   ROTATOR CUFF REPAIR Right ~4 years    Current Medications: Current Meds  Medication Sig   Aflibercept  (EYLEA  HD IZ) Inject 1 each as directed See admin instructions. Q6 weeks both eyes   aspirin 81 MG tablet Take 81 mg by mouth daily.   carvedilol  (COREG ) 6.25 MG tablet Take 6.25 mg by mouth 2 (two) times daily.   Cholecalciferol (VITAMIN D) 1000 UNITS capsule Take 1,000 Units by mouth daily.   Cholecalciferol (VITAMIN D3) 1000 units CAPS Take 1 capsule by mouth daily.   Coenzyme Q10 300 MG CAPS Take 1 capsule by mouth daily.   Cyanocobalamin (B-12) 1000 MCG CAPS Take 1 tablet by mouth daily.    dutasteride (AVODART) 0.5 MG capsule Take 0.5 mg by mouth daily.   levothyroxine (SYNTHROID,  LEVOTHROID) 75 MCG tablet Take 1 tablet by mouth daily.   Multiple Vitamin (MULTIVITAMIN) tablet Take 2 tablets by mouth daily. Unknown strength   Omega-3 Fatty Acids (FISH OIL) 1200 MG CAPS Take 1,200 mg by mouth 2 (two) times daily.    OXYGEN  Inhale 2 mLs into the lungs at bedtime.   rosuvastatin (CRESTOR) 5 MG tablet Take 1 tablet by mouth daily.   Tadalafil  (CIALIS ) 2.5 MG TABS Take 1 tablet (2.5 mg total) by mouth daily.   [DISCONTINUED] finasteride (PROSCAR) 5 MG tablet Take 5 mg by mouth daily.   [DISCONTINUED] Misc Natural Products (PROSTATE HEALTH PO) Take 1 tablet by mouth daily. Unknown strength per patient   [DISCONTINUED] nitroGLYCERIN  (NITROSTAT ) 0.4 MG SL tablet Place 1 tablet (0.4 mg total) under the tongue every 5 (five) minutes as needed for chest pain.   [DISCONTINUED] silodosin (RAPAFLO) 4 MG CAPS capsule Take 4 mg by mouth daily with breakfast.     Allergies:   Patient has no known allergies.   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 ROS:   Please see the  history of present illness.    All 14 point review of systems negative except as described per history of present illness  EKGs/Labs/Other Studies Reviewed:         Recent Labs: 10/22/2022: BUN 25; Creatinine, Ser 1.94; Potassium 4.7; Sodium 136  Recent Lipid Panel No results found for: "CHOL", "TRIG", "HDL", "CHOLHDL", "VLDL", "LDLCALC", "LDLDIRECT"  Physical Exam:    VS:  BP 134/78 (BP Location: Left Arm, Patient Position: Sitting)   Pulse 65   Ht 5\' 8"  (1.727 m)   Wt 180 lb 12.8 oz (82 kg)   SpO2 97%  BMI 27.49 kg/m     Wt Readings from Last 3 Encounters:  07/12/23 180 lb 12.8 oz (82 kg)  04/08/23 183 lb 12.8 oz (83.4 kg)  02/25/23 189 lb (85.7 kg)     GEN:  Well nourished, well developed in no acute distress HEENT: Normal NECK: No JVD; No carotid bruits LYMPHATICS: No lymphadenopathy CARDIAC: RRR, no murmurs, no rubs, no gallops RESPIRATORY:  Clear to auscultation without rales, wheezing or rhonchi  ABDOMEN: Soft, non-tender, non-distended MUSCULOSKELETAL:  No edema; No deformity  SKIN: Warm and dry LOWER EXTREMITIES: no swelling NEUROLOGIC:  Alert and oriented x 3 PSYCHIATRIC:  Normal affect   ASSESSMENT:    No diagnosis found. PLAN:    In order of problems listed above:  Coronary artery disease stable from that point review stress to show a mild area of ischemia but he is completely asymptomatic, will continue present management History of ischemic cardiomyopathy with normalization based on last echocardiogram. Obstructive sleep apnea stable follow-up my antimedicine team. Dyslipidemia I did review K PN which show me LDL 59 HDL 38 glucose control continue present management. Prostate tissue he is getting Cialis  2.5 with good help.  He is being also followed by urologist.   Medication Adjustments/Labs and Tests Ordered: Current medicines are reviewed at length with the patient today.  Concerns regarding medicines are outlined above.  No orders of the  defined types were placed in this encounter.  Medication changes: No orders of the defined types were placed in this encounter.   Signed, Manfred Seed, MD, [facility] 07/12/2023 9:38 AM    [facility]

## 2023-07-12 NOTE — Patient Instructions (Signed)
Medication Instructions:  Your physician recommends that you continue on your current medications as directed. Please refer to the Current Medication list given to you today.  *If you need a refill on your cardiac medications before your next appointment, please call your pharmacy*   Lab Work: None Ordered If you have labs (blood work) drawn today and your tests are completely normal, you will receive your results only by: MyChart Message (if you have MyChart) OR A paper copy in the mail If you have any lab test that is abnormal or we need to change your treatment, we will call you to review the results.   Testing/Procedures: None Ordered   Follow-Up: At [facility], you and your health needs are our priority.  As part of our continuing mission to provide you with exceptional heart care, we have created designated Provider Care Teams.  These Care Teams include your primary Cardiologist (physician) and Advanced Practice Providers (APPs -  Physician Assistants and Nurse Practitioners) who all work together to provide you with the care you need, when you need it.  We recommend signing up for the patient portal called "MyChart".  Sign up information is provided on this After Visit Summary.  MyChart is used to connect with patients for Virtual Visits (Telemedicine).  Patients are able to view lab/test results, encounter notes, upcoming appointments, etc.  Non-urgent messages can be sent to your provider as well.   To learn more about what you can do with MyChart, go to [email or web address].    Your next appointment:   5 month(s)  The format for your next appointment:   In Person  Provider:   Robert Krasowski, MD    Other Instructions NA  

## 2023-08-16 DIAGNOSIS — N1832 Chronic kidney disease, stage 3b: Secondary | ICD-10-CM | POA: Diagnosis not present

## 2023-08-16 DIAGNOSIS — Z6829 Body mass index (BMI) 29.0-29.9, adult: Secondary | ICD-10-CM | POA: Diagnosis not present

## 2023-08-16 DIAGNOSIS — E785 Hyperlipidemia, unspecified: Secondary | ICD-10-CM | POA: Diagnosis not present

## 2023-08-16 DIAGNOSIS — R7303 Prediabetes: Secondary | ICD-10-CM | POA: Diagnosis not present

## 2023-08-16 DIAGNOSIS — D649 Anemia, unspecified: Secondary | ICD-10-CM | POA: Diagnosis not present

## 2023-08-16 DIAGNOSIS — E039 Hypothyroidism, unspecified: Secondary | ICD-10-CM | POA: Diagnosis not present

## 2023-08-16 DIAGNOSIS — I10 Essential (primary) hypertension: Secondary | ICD-10-CM | POA: Diagnosis not present

## 2023-08-16 DIAGNOSIS — Z79899 Other long term (current) drug therapy: Secondary | ICD-10-CM | POA: Diagnosis not present

## 2023-08-16 DIAGNOSIS — I25119 Atherosclerotic heart disease of native coronary artery with unspecified angina pectoris: Secondary | ICD-10-CM | POA: Diagnosis not present

## 2023-08-24 DIAGNOSIS — H34833 Tributary (branch) retinal vein occlusion, bilateral, with macular edema: Secondary | ICD-10-CM | POA: Diagnosis not present

## 2023-08-24 DIAGNOSIS — H353112 Nonexudative age-related macular degeneration, right eye, intermediate dry stage: Secondary | ICD-10-CM | POA: Diagnosis not present

## 2023-08-24 DIAGNOSIS — H02836 Dermatochalasis of left eye, unspecified eyelid: Secondary | ICD-10-CM | POA: Diagnosis not present

## 2023-08-24 DIAGNOSIS — H02833 Dermatochalasis of right eye, unspecified eyelid: Secondary | ICD-10-CM | POA: Diagnosis not present

## 2023-08-24 DIAGNOSIS — H43813 Vitreous degeneration, bilateral: Secondary | ICD-10-CM | POA: Diagnosis not present

## 2023-10-12 DIAGNOSIS — H02833 Dermatochalasis of right eye, unspecified eyelid: Secondary | ICD-10-CM | POA: Diagnosis not present

## 2023-10-12 DIAGNOSIS — H02836 Dermatochalasis of left eye, unspecified eyelid: Secondary | ICD-10-CM | POA: Diagnosis not present

## 2023-10-12 DIAGNOSIS — H43813 Vitreous degeneration, bilateral: Secondary | ICD-10-CM | POA: Diagnosis not present

## 2023-10-12 DIAGNOSIS — H353112 Nonexudative age-related macular degeneration, right eye, intermediate dry stage: Secondary | ICD-10-CM | POA: Diagnosis not present

## 2023-10-12 DIAGNOSIS — H34833 Tributary (branch) retinal vein occlusion, bilateral, with macular edema: Secondary | ICD-10-CM | POA: Diagnosis not present

## 2023-10-27 DIAGNOSIS — N529 Male erectile dysfunction, unspecified: Secondary | ICD-10-CM | POA: Diagnosis not present

## 2023-10-27 DIAGNOSIS — N3289 Other specified disorders of bladder: Secondary | ICD-10-CM | POA: Diagnosis not present

## 2023-10-27 DIAGNOSIS — N401 Enlarged prostate with lower urinary tract symptoms: Secondary | ICD-10-CM | POA: Diagnosis not present

## 2023-10-27 DIAGNOSIS — N289 Disorder of kidney and ureter, unspecified: Secondary | ICD-10-CM | POA: Diagnosis not present

## 2023-10-27 DIAGNOSIS — R972 Elevated prostate specific antigen [PSA]: Secondary | ICD-10-CM | POA: Diagnosis not present

## 2023-10-27 DIAGNOSIS — C61 Malignant neoplasm of prostate: Secondary | ICD-10-CM | POA: Diagnosis not present

## 2023-11-15 DIAGNOSIS — E785 Hyperlipidemia, unspecified: Secondary | ICD-10-CM | POA: Diagnosis not present

## 2023-11-15 DIAGNOSIS — L989 Disorder of the skin and subcutaneous tissue, unspecified: Secondary | ICD-10-CM | POA: Diagnosis not present

## 2023-11-15 DIAGNOSIS — M25473 Effusion, unspecified ankle: Secondary | ICD-10-CM | POA: Diagnosis not present

## 2023-11-15 DIAGNOSIS — E039 Hypothyroidism, unspecified: Secondary | ICD-10-CM | POA: Diagnosis not present

## 2023-11-15 DIAGNOSIS — Z6830 Body mass index (BMI) 30.0-30.9, adult: Secondary | ICD-10-CM | POA: Diagnosis not present

## 2023-11-15 DIAGNOSIS — I10 Essential (primary) hypertension: Secondary | ICD-10-CM | POA: Diagnosis not present

## 2023-11-15 DIAGNOSIS — M47812 Spondylosis without myelopathy or radiculopathy, cervical region: Secondary | ICD-10-CM | POA: Diagnosis not present

## 2023-11-15 DIAGNOSIS — R5383 Other fatigue: Secondary | ICD-10-CM | POA: Diagnosis not present

## 2023-12-07 DIAGNOSIS — H02833 Dermatochalasis of right eye, unspecified eyelid: Secondary | ICD-10-CM | POA: Diagnosis not present

## 2023-12-07 DIAGNOSIS — H353112 Nonexudative age-related macular degeneration, right eye, intermediate dry stage: Secondary | ICD-10-CM | POA: Diagnosis not present

## 2023-12-07 DIAGNOSIS — H43813 Vitreous degeneration, bilateral: Secondary | ICD-10-CM | POA: Diagnosis not present

## 2023-12-07 DIAGNOSIS — H02836 Dermatochalasis of left eye, unspecified eyelid: Secondary | ICD-10-CM | POA: Diagnosis not present

## 2023-12-07 DIAGNOSIS — H34833 Tributary (branch) retinal vein occlusion, bilateral, with macular edema: Secondary | ICD-10-CM | POA: Diagnosis not present

## 2023-12-10 ENCOUNTER — Ambulatory Visit: Attending: Cardiology | Admitting: Cardiology

## 2023-12-10 ENCOUNTER — Encounter: Payer: Self-pay | Admitting: Cardiology

## 2023-12-10 VITALS — BP 120/78 | HR 68 | Ht 69.0 in | Wt 186.6 lb

## 2023-12-10 DIAGNOSIS — I255 Ischemic cardiomyopathy: Secondary | ICD-10-CM | POA: Diagnosis not present

## 2023-12-10 DIAGNOSIS — R0609 Other forms of dyspnea: Secondary | ICD-10-CM | POA: Diagnosis not present

## 2023-12-10 DIAGNOSIS — I10 Essential (primary) hypertension: Secondary | ICD-10-CM

## 2023-12-10 DIAGNOSIS — E785 Hyperlipidemia, unspecified: Secondary | ICD-10-CM | POA: Diagnosis not present

## 2023-12-10 DIAGNOSIS — I2510 Atherosclerotic heart disease of native coronary artery without angina pectoris: Secondary | ICD-10-CM

## 2023-12-10 NOTE — Patient Instructions (Signed)

## 2023-12-10 NOTE — Progress Notes (Unsigned)
 Cardiology Office Note:    Date:  12/10/2023   ID:  [name], DOB Sep 30, 1940, MRN [id number]  PCP:  Jefferey Fitch, MD  Cardiologist:  Lamar Fitch, MD    Referring MD: Jefferey Fitch, MD   Chief Complaint  Patient presents with   Follow-up    History of Present Illness:    [name] is a 83 y.o. male past medical history significant for coronary artery disease, status post coronary bypass graft in 2006, stress test on last year showed small area of ischemia involving inferior wall but he is completely asymptomatic doing well therefore we decided to pursue medical therapy, also sleep apnea on CPAP mask, essential hypertension, dyslipidemia, history of cardiomyopathy with normalization.  Comes today to my office for follow-up overall doing great denies of any chest pain tightness squeezing pressure burning chest.  He said he gets sometimes short of breath when he carries 5 g bags of groceries but otherwise doing well  Past Medical History:  Diagnosis Date   Anxiety    ANXIETY 07/28/2010   Qualifier: Diagnosis of  By: Germaine LPN, [name]     Arthritis    neck; limited neck movement   Branch retinal vein occlusion with macular edema of left eye 09/05/2019   Branch retinal vein occlusion with macular edema of right eye 09/05/2019   Chronic cholecystitis 05/13/2020   Chronic rhinitis    Coronary artery disease    Coronary artery disease involving native coronary artery of native heart 05/11/2016   Depression    no treatment since he retired   DEPRESSION 07/28/2010   Qualifier: Diagnosis of  By: Germaine LPN, [name]     Dyspnea    with exertion   Essential hypertension 07/28/2010   Qualifier: Diagnosis of  By: Germaine LPN, [name]     Essential tremor 09/07/2017   Gallbladder sludge 05/13/2020   GERD (gastroesophageal reflux disease)    Headache    hx subdural hematoma   Hyperlipidemia    HYPERLIPIDEMIA 07/28/2010   Qualifier: Diagnosis of  By: Germaine LPN, [name]      Hypertension    Hypothyroidism    Intermediate stage nonexudative age-related macular degeneration of right eye 09/05/2019   Ischemic cardiomyopathy 05/11/2016   OBSTRUCTIVE SLEEP APNEA 07/29/2010   NPSG 2006:  AHI 18/hr. Pressure optimized to 12cm by auto device 2012     OSA (obstructive sleep apnea)    Posterior vitreous detachment of left eye 09/05/2019   Posterior vitreous detachment of right eye 09/05/2019   Postoperative examination 06/10/2020   RESTLESS LEGS SYNDROME 07/29/2010   Trial of requip 2012:  +intolerance, didn't feel it made a difference    Status post coronary artery bypass graft 05/11/2016   Overview:  2005  Formatting of this note might be different from the original. 2005   Stroke [facility]) ~5yrs ago   mild affects Lt eye   Subdural hematoma (HCC) 2004    Past Surgical History:  Procedure Laterality Date   APPENDECTOMY  1970s   BRAIN HEMATOMA EVACUATION  ~2005   subdural hematoma   CARPAL TUNNEL RELEASE Right 05/21/2016   Procedure: CARPAL TUNNEL RELEASE, right;  Surgeon: Arley Curia, MD;  Location: [facility];  Service: Orthopedics;  Laterality: Right;  FAB   CATARACT EXTRACTION Left    CORONARY ARTERY BYPASS GRAFT  ~2006   gall bladder removed   05/2020   Heart bypass  2005   ROTATOR CUFF REPAIR Right ~4 years    Current  Medications: Current Meds  Medication Sig   aspirin 81 MG tablet Take 81 mg by mouth daily.   carvedilol  (COREG ) 6.25 MG tablet Take 6.25 mg by mouth 2 (two) times daily.   Cholecalciferol (VITAMIN D) 1000 UNITS capsule Take 1,000 Units by mouth daily.   Coenzyme Q10 300 MG CAPS Take 1 capsule by mouth daily.   dutasteride (AVODART) 0.5 MG capsule Take 0.5 mg by mouth daily.   hydrocortisone (PROCTOSOL HC) 2.5 % rectal cream Place 1 Application rectally 2 (two) times daily.   levothyroxine (SYNTHROID, LEVOTHROID) 75 MCG tablet Take 1 tablet by mouth daily.   Multiple Vitamin (MULTIVITAMIN) tablet Take 2 tablets by mouth daily.  Unknown strength   Omega-3 Fatty Acids (FISH OIL) 1200 MG CAPS Take 1,200 mg by mouth 2 (two) times daily.    OXYGEN  Inhale 2 mLs into the lungs at bedtime.   tadalafil  (CIALIS ) 5 MG tablet Take 5 mg by mouth daily as needed for erectile dysfunction.   [DISCONTINUED] Aflibercept  (EYLEA  HD IZ) Inject 1 each as directed See admin instructions. Q6 weeks both eyes   [DISCONTINUED] Cholecalciferol (VITAMIN D3) 1000 units CAPS Take 1 capsule by mouth daily.   [DISCONTINUED] Cyanocobalamin (B-12) 1000 MCG CAPS Take 1 tablet by mouth daily.    [DISCONTINUED] rosuvastatin (CRESTOR) 5 MG tablet Take 1 tablet by mouth daily.   [DISCONTINUED] Tadalafil  (CIALIS ) 2.5 MG TABS Take 1 tablet (2.5 mg total) by mouth daily.     Allergies:   Patient has no known allergies.   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 ROS:   Please see the history of present illness.    All 14 point review of systems negative except as described per history of present illness  EKGs/Labs/Other Studies Reviewed:     EKG Interpretation Date/Time:  Friday December 10 2023 14:13:42 EDT Ventricular Rate:  68 PR Interval:  182 QRS Duration:  84 QT Interval:  406 QTC Calculation: 431 R Axis:   23  Text Interpretation: Normal sinus rhythm Increased R/S ratio in V1, consider early transition or posterior infarct Abnormal ECG When compared with ECG of 06-Jan-2011 06:38, Borderline criteria for Inferior infarct are no longer Present Confirmed by Bernie Charleston [phone]) on 12/10/2023 2:31:16 PM    Recent Labs: No results found for requested labs within last 365 days.  Recent Lipid Panel No results found for: CHOL, TRIG, HDL, CHOLHDL, VLDL, LDLCALC,  LDLDIRECT  Physical Exam:    VS:  BP 120/78 (BP Location: Left Arm, Patient Position: Sitting)   Pulse 68   Ht 5' 9 (1.753 m)   Wt 186 lb 9.6 oz (84.6 kg)   SpO2 95%   BMI 27.56 kg/m     Wt Readings from Last 3 Encounters:  12/10/23 186 lb 9.6 oz (84.6 kg)  07/12/23 180 lb 12.8 oz (82 kg)  04/08/23 183 lb 12.8 oz (83.4 kg)     GEN:  Well nourished, well developed in no acute distress HEENT: Normal NECK: No JVD; No carotid bruits LYMPHATICS: No lymphadenopathy CARDIAC: RRR, no murmurs, no rubs, no gallops RESPIRATORY:  Clear to auscultation without rales, wheezing or rhonchi  ABDOMEN: Soft, non-tender, non-distended MUSCULOSKELETAL:  No edema; No deformity  SKIN: Warm and dry LOWER EXTREMITIES: no swelling NEUROLOGIC:  Alert and oriented x 3 PSYCHIATRIC:  Normal affect   ASSESSMENT:    1. Essential hypertension   2. Coronary artery disease involving native coronary artery of native heart without angina pectoris   3. Ischemic cardiomyopathy   4. Primary hypertension   5. Dyslipidemia (high LDL; low HDL)    PLAN:    In order of problems listed above:  Essential hypertension blood pressure well-controlled continue present management. Coronary disease stable from that point review does have a stress test done last year  showing small area of ischemia but completely asymptomatic, medical management. Dyslipidemia I did review K PN which show me his LDL 59 HDL 38.  Will continue present management. History of ischemic cardiomyopathy will repeat his echocardiogram.   Medication Adjustments/Labs and Tests Ordered: Current medicines are reviewed at length with the patient today.  Concerns regarding medicines are outlined above.  Orders Placed This Encounter  Procedures   EKG 12-Lead   Medication changes: No orders of the defined types were placed in this encounter.   Signed, Lamar DOROTHA Fitch, MD, [facility] 12/10/2023 2:37 PM    [facility]

## 2023-12-31 ENCOUNTER — Ambulatory Visit: Attending: Cardiology

## 2023-12-31 DIAGNOSIS — R0609 Other forms of dyspnea: Secondary | ICD-10-CM | POA: Diagnosis not present

## 2024-01-02 LAB — ECHOCARDIOGRAM COMPLETE
Area-P 1/2: 3.09 cm2
MV M vel: 4.78 m/s
MV Peak grad: 91.4 mmHg
P 1/2 time: 485 ms
S' Lateral: 3.3 cm

## 2024-01-10 ENCOUNTER — Ambulatory Visit: Payer: Self-pay | Admitting: Cardiology

## 2024-01-10 ENCOUNTER — Telehealth: Payer: Self-pay

## 2024-01-10 NOTE — Telephone Encounter (Signed)
 Left message on My Chart with Echo results per Dr. Karry note. Routed to PCP.

## 2024-01-12 DIAGNOSIS — D225 Melanocytic nevi of trunk: Secondary | ICD-10-CM | POA: Diagnosis not present

## 2024-01-12 DIAGNOSIS — L570 Actinic keratosis: Secondary | ICD-10-CM | POA: Diagnosis not present

## 2024-01-12 DIAGNOSIS — L814 Other melanin hyperpigmentation: Secondary | ICD-10-CM | POA: Diagnosis not present

## 2024-01-12 DIAGNOSIS — L820 Inflamed seborrheic keratosis: Secondary | ICD-10-CM | POA: Diagnosis not present

## 2024-01-12 DIAGNOSIS — L821 Other seborrheic keratosis: Secondary | ICD-10-CM | POA: Diagnosis not present

## 2024-01-12 DIAGNOSIS — D485 Neoplasm of uncertain behavior of skin: Secondary | ICD-10-CM | POA: Diagnosis not present

## 2024-02-02 DIAGNOSIS — C44629 Squamous cell carcinoma of skin of left upper limb, including shoulder: Secondary | ICD-10-CM | POA: Diagnosis not present

## 2024-02-08 DIAGNOSIS — H02836 Dermatochalasis of left eye, unspecified eyelid: Secondary | ICD-10-CM | POA: Diagnosis not present

## 2024-02-08 DIAGNOSIS — G4733 Obstructive sleep apnea (adult) (pediatric): Secondary | ICD-10-CM | POA: Diagnosis not present

## 2024-02-08 DIAGNOSIS — H43811 Vitreous degeneration, right eye: Secondary | ICD-10-CM | POA: Diagnosis not present

## 2024-02-08 DIAGNOSIS — H43812 Vitreous degeneration, left eye: Secondary | ICD-10-CM | POA: Diagnosis not present

## 2024-02-08 DIAGNOSIS — H34833 Tributary (branch) retinal vein occlusion, bilateral, with macular edema: Secondary | ICD-10-CM | POA: Diagnosis not present

## 2024-02-08 DIAGNOSIS — H02833 Dermatochalasis of right eye, unspecified eyelid: Secondary | ICD-10-CM | POA: Diagnosis not present

## 2024-02-08 DIAGNOSIS — H353112 Nonexudative age-related macular degeneration, right eye, intermediate dry stage: Secondary | ICD-10-CM | POA: Diagnosis not present

## 2024-02-14 DIAGNOSIS — Z79899 Other long term (current) drug therapy: Secondary | ICD-10-CM | POA: Diagnosis not present

## 2024-02-14 DIAGNOSIS — I25119 Atherosclerotic heart disease of native coronary artery with unspecified angina pectoris: Secondary | ICD-10-CM | POA: Diagnosis not present

## 2024-02-14 DIAGNOSIS — G4733 Obstructive sleep apnea (adult) (pediatric): Secondary | ICD-10-CM | POA: Diagnosis not present

## 2024-02-14 DIAGNOSIS — E039 Hypothyroidism, unspecified: Secondary | ICD-10-CM | POA: Diagnosis not present

## 2024-02-14 DIAGNOSIS — C61 Malignant neoplasm of prostate: Secondary | ICD-10-CM | POA: Diagnosis not present

## 2024-02-14 DIAGNOSIS — R7301 Impaired fasting glucose: Secondary | ICD-10-CM | POA: Diagnosis not present

## 2024-02-14 DIAGNOSIS — E785 Hyperlipidemia, unspecified: Secondary | ICD-10-CM | POA: Diagnosis not present

## 2024-02-14 DIAGNOSIS — I10 Essential (primary) hypertension: Secondary | ICD-10-CM | POA: Diagnosis not present

## 2024-02-14 DIAGNOSIS — N1832 Chronic kidney disease, stage 3b: Secondary | ICD-10-CM | POA: Diagnosis not present

## 2024-02-14 DIAGNOSIS — L03114 Cellulitis of left upper limb: Secondary | ICD-10-CM | POA: Diagnosis not present

## 2024-03-03 DIAGNOSIS — C61 Malignant neoplasm of prostate: Secondary | ICD-10-CM | POA: Diagnosis not present

## 2024-03-03 DIAGNOSIS — N3289 Other specified disorders of bladder: Secondary | ICD-10-CM | POA: Diagnosis not present

## 2024-03-03 DIAGNOSIS — N289 Disorder of kidney and ureter, unspecified: Secondary | ICD-10-CM | POA: Diagnosis not present

## 2024-03-17 DIAGNOSIS — E785 Hyperlipidemia, unspecified: Secondary | ICD-10-CM | POA: Diagnosis not present

## 2024-03-17 DIAGNOSIS — I25119 Atherosclerotic heart disease of native coronary artery with unspecified angina pectoris: Secondary | ICD-10-CM | POA: Diagnosis not present

## 2024-03-17 DIAGNOSIS — Z7982 Long term (current) use of aspirin: Secondary | ICD-10-CM | POA: Diagnosis not present

## 2024-03-17 DIAGNOSIS — K219 Gastro-esophageal reflux disease without esophagitis: Secondary | ICD-10-CM | POA: Diagnosis not present

## 2024-03-17 DIAGNOSIS — M1990 Unspecified osteoarthritis, unspecified site: Secondary | ICD-10-CM | POA: Diagnosis not present

## 2024-03-17 DIAGNOSIS — K5900 Constipation, unspecified: Secondary | ICD-10-CM | POA: Diagnosis not present

## 2024-03-17 DIAGNOSIS — H348392 Tributary (branch) retinal vein occlusion, unspecified eye, stable: Secondary | ICD-10-CM | POA: Diagnosis not present

## 2024-03-17 DIAGNOSIS — N1832 Chronic kidney disease, stage 3b: Secondary | ICD-10-CM | POA: Diagnosis not present

## 2024-03-17 DIAGNOSIS — E039 Hypothyroidism, unspecified: Secondary | ICD-10-CM | POA: Diagnosis not present

## 2024-04-14 ENCOUNTER — Other Ambulatory Visit: Payer: Self-pay | Admitting: Urology

## 2024-04-14 DIAGNOSIS — C61 Malignant neoplasm of prostate: Secondary | ICD-10-CM

## 2024-04-18 DIAGNOSIS — H02833 Dermatochalasis of right eye, unspecified eyelid: Secondary | ICD-10-CM | POA: Diagnosis not present

## 2024-04-18 DIAGNOSIS — H43812 Vitreous degeneration, left eye: Secondary | ICD-10-CM | POA: Diagnosis not present

## 2024-04-18 DIAGNOSIS — H353112 Nonexudative age-related macular degeneration, right eye, intermediate dry stage: Secondary | ICD-10-CM | POA: Diagnosis not present

## 2024-04-18 DIAGNOSIS — H43811 Vitreous degeneration, right eye: Secondary | ICD-10-CM | POA: Diagnosis not present

## 2024-04-18 DIAGNOSIS — H34833 Tributary (branch) retinal vein occlusion, bilateral, with macular edema: Secondary | ICD-10-CM | POA: Diagnosis not present

## 2024-04-18 DIAGNOSIS — H34831 Tributary (branch) retinal vein occlusion, right eye, with macular edema: Secondary | ICD-10-CM | POA: Diagnosis not present

## 2024-04-18 DIAGNOSIS — H34832 Tributary (branch) retinal vein occlusion, left eye, with macular edema: Secondary | ICD-10-CM | POA: Diagnosis not present

## 2024-05-09 ENCOUNTER — Encounter: Payer: Self-pay | Admitting: Urology

## 2024-05-22 ENCOUNTER — Inpatient Hospital Stay: Admission: RE | Admit: 2024-05-22 | Discharge: 2024-05-22 | Attending: Urology | Admitting: Urology

## 2024-05-22 DIAGNOSIS — C61 Malignant neoplasm of prostate: Secondary | ICD-10-CM

## 2024-05-22 MED ORDER — GADOPICLENOL 0.5 MMOL/ML IV SOLN
8.0000 mL | Freq: Once | INTRAVENOUS | Status: AC | PRN
  Administered 2024-05-22: 8 mL via INTRAVENOUS

## 2024-06-19 ENCOUNTER — Encounter: Payer: Self-pay | Admitting: Cardiology

## 2024-06-19 ENCOUNTER — Ambulatory Visit: Attending: Cardiology | Admitting: Cardiology

## 2024-06-19 VITALS — BP 134/70 | HR 64 | Ht 69.0 in | Wt 182.0 lb

## 2024-06-19 DIAGNOSIS — G4733 Obstructive sleep apnea (adult) (pediatric): Secondary | ICD-10-CM | POA: Diagnosis not present

## 2024-06-19 DIAGNOSIS — E785 Hyperlipidemia, unspecified: Secondary | ICD-10-CM

## 2024-06-19 DIAGNOSIS — C61 Malignant neoplasm of prostate: Secondary | ICD-10-CM | POA: Diagnosis not present

## 2024-06-19 DIAGNOSIS — I2510 Atherosclerotic heart disease of native coronary artery without angina pectoris: Secondary | ICD-10-CM | POA: Diagnosis not present

## 2024-06-19 DIAGNOSIS — I255 Ischemic cardiomyopathy: Secondary | ICD-10-CM

## 2024-06-19 NOTE — Patient Instructions (Addendum)

## 2024-06-19 NOTE — Progress Notes (Unsigned)
 " Cardiology Office Note:    Date:  06/19/2024   ID:  [name], DOB 1940/12/19, MRN [id number]  PCP:  Ralph Fitch, MD  Cardiologist:  Ralph Fitch, MD    Referring MD: Ralph Fitch, MD   Chief Complaint  Patient presents with   Follow-up    History of Present Illness:    Discussed the use of AI scribe software for clinical note transcription with the patient, who gave verbal consent to proceed.  History of Present Illness [name] is an 84 year old male who presents for cardiovascular evaluation. He was referred by [name] for evaluation of prostate cancer treatment options.  He has a history of prostate cancer with a tumor on the prostate. An MRI conducted before the start of the year revealed a new finding on the prostate. He anticipates a biopsy to assess the aggressiveness of the cancer and is considering options for prostate removal.  He is physically active, carrying groceries up and down stairs multiple times a day without experiencing chest pain or tightness, although he does get slightly out of breath. He engages in outdoor activities such as cleaning up brush and using a chainsaw. No chest pain, tightness, or leg swelling. Reports being slightly out of breath with exertion.  He has a history of skin cancers, including basal cell carcinoma, which was recently excised from his hand. He has had multiple skin cancers removed in the past, including from his nose and chin. Previous lesions have not healed well, often bleeding or weeping.        Past Medical History:  Diagnosis Date   Anxiety    ANXIETY 07/28/2010   Qualifier: Diagnosis of  By: [name], [name]     Arthritis    neck; limited neck movement   Branch retinal vein occlusion with macular edema of left eye (HCC) 09/05/2019   Branch retinal vein occlusion with macular edema of right eye (HCC) 09/05/2019   Chronic cholecystitis 05/13/2020   Chronic rhinitis    Coronary artery  disease    Coronary artery disease involving native coronary artery of native heart 05/11/2016   Depression    no treatment since he retired   DEPRESSION 07/28/2010   Qualifier: Diagnosis of  By: [name], [name]     Dyspnea    with exertion   Essential hypertension 07/28/2010   Qualifier: Diagnosis of  By: [name], [name]     Essential tremor 09/07/2017   Gallbladder sludge 05/13/2020   GERD (gastroesophageal reflux disease)    Headache    hx subdural hematoma   Hyperlipidemia    HYPERLIPIDEMIA 07/28/2010   Qualifier: Diagnosis of  By: [name], [name]     Hypertension    Hypothyroidism    Intermediate stage nonexudative age-related macular degeneration of right eye 09/05/2019   Ischemic cardiomyopathy 05/11/2016   OBSTRUCTIVE SLEEP APNEA 07/29/2010   NPSG 2006:  AHI 18/hr. Pressure optimized to 12cm by auto device 2012     OSA (obstructive sleep apnea)    Posterior vitreous detachment of left eye 09/05/2019   Posterior vitreous detachment of right eye 09/05/2019   Postoperative examination 06/10/2020   RESTLESS LEGS SYNDROME 07/29/2010   Trial of requip 2012:  +intolerance, didn't feel it made a difference    Status post coronary artery bypass graft 05/11/2016   Overview:  2005  Formatting of this note might be different from the original. 2005   Stroke [facility]) ~59yrs ago   mild affects Lt  eye   Subdural hematoma (HCC) 2004    Past Surgical History:  Procedure Laterality Date   APPENDECTOMY  1970s   BRAIN HEMATOMA EVACUATION  ~2005   subdural hematoma   CARPAL TUNNEL RELEASE Right 05/21/2016   Procedure: CARPAL TUNNEL RELEASE, right;  Surgeon: Arley Curia, MD;  Location: [facility];  Service: Orthopedics;  Laterality: Right;  FAB   CATARACT EXTRACTION Left    CORONARY ARTERY BYPASS GRAFT  ~2006   gall bladder removed   05/2020   Heart bypass  2005   ROTATOR CUFF REPAIR Right ~4 years    Current Medications: Active Medications[1]   Allergies:   Patient  has no known allergies.   Social History   Socioeconomic History   Marital status: Married    Spouse name: [name]   Number of children: 1   Years of education: Not on file   Highest education level: Not on file  Occupational History    Comment: Visual Merchandiser    Comment: [employer]  Tobacco Use   Smoking status: Never   Smokeless tobacco: Never  Vaping Use   Vaping status: Never Used  Substance and Sexual Activity   Alcohol  use: No   Drug use: No   Sexual activity: Not on file  Other Topics Concern   Not on file  Social History Narrative   Pt works part time at [employer] and part time as a visual merchandiser   Social Drivers of Health   Tobacco Use: Low Risk (06/19/2024)   Patient History    Smoking Tobacco Use: Never    Smokeless Tobacco Use: Never    Passive Exposure: Not on file  Financial Resource Strain: Not on file  Food Insecurity: Not on file  Transportation Needs: Not on file  Physical Activity: Not on file  Stress: Not on file  Social Connections: Not on file  Depression (EYV7-0): Not on file  Alcohol  Screen: Not on file  Housing: Not on file  Utilities: Not on file  Health Literacy: Not on file     Family History: The patient's family history includes Heart disease in his father; Leukemia in his son; Stroke in his mother. ROS:   Please see the history of present illness.    All 14 point review of systems negative except as described per history of present illness  EKGs/Labs/Other Studies Reviewed:         Recent Labs: No results found for requested labs within last 365 days.  Recent Lipid Panel No results found for: CHOL, TRIG, HDL, CHOLHDL, VLDL, LDLCALC, LDLDIRECT  Physical Exam:    VS:  BP 134/70   Pulse 64   Ht 5' 9 (1.753 m)   Wt 182 lb (82.6 kg)   SpO2 99%   BMI 26.88 kg/m     Wt Readings from Last 3 Encounters:  06/19/24 182 lb (82.6 kg)  12/10/23 186 lb 9.6 oz (84.6 kg)  07/12/23 180 lb 12.8 oz (82 kg)      GEN:  Well nourished, well developed in no acute distress HEENT: Normal NECK: No JVD; No carotid bruits LYMPHATICS: No lymphadenopathy CARDIAC: RRR, no murmurs, no rubs, no gallops RESPIRATORY:  Clear to auscultation without rales, wheezing or rhonchi  ABDOMEN: Soft, non-tender, non-distended MUSCULOSKELETAL:  No edema; No deformity  SKIN: Warm and dry LOWER EXTREMITIES: no swelling NEUROLOGIC:  Alert and oriented x 3 PSYCHIATRIC:  Normal affect   ASSESSMENT:    1. Coronary artery disease involving native coronary  artery of native heart without angina pectoris   2. Ischemic cardiomyopathy   3. OSA (obstructive sleep apnea)   4. Prostate cancer (HCC)   5. Dyslipidemia (high LDL; low HDL)    PLAN:    In order of problems listed above:  Coronary disease, status post coronary artery bypass grafting 2006, stress test done 2 years ago showing small area of ischemia inferior wall but he is completely asymptomatic comes today for regular follow-up.  Overall doing great from that point reviewed no chest pain tightness squeezing pressure burning chest, he is able to walk climb stairs with carrying heavy shopping bags with no difficulty get a little short of breath but otherwise doing fine. Ischemic cardiomyopathy, echocardiogram done last year showed normal left ventricle ejection fraction mild mitral valve regurgitation overall looks good. Obstructive sleep apnea that being followed by internal medicine team. Prostate cancer discussion as above.  He looks like waiting for biopsy to decide appropriate course of action. Dyslipidemia I did review blood test done by primary care physician he is LDL is 51 will continue present management   Medication Adjustments/Labs and Tests Ordered: Current medicines are reviewed at length with the patient today.  Concerns regarding medicines are outlined above.  No orders of the defined types were placed in this encounter.  Medication changes: No orders  of the defined types were placed in this encounter.   Signed, Ralph DOROTHA Fitch, MD, [facility] 06/19/2024 2:05 PM    [facility]     [1]  Current Meds  Medication Sig   aspirin 81 MG tablet Take 81 mg by mouth daily.   azelastine (ASTELIN) 0.1 % nasal spray Place 1 spray into both nostrils as needed for rhinitis. Use in each nostril as directed   carvedilol  (COREG ) 6.25 MG tablet Take 6.25 mg by mouth 2 (two) times daily.   Cholecalciferol (VITAMIN D) 1000 UNITS capsule Take 1,000 Units by mouth daily.   Coenzyme Q10 300 MG CAPS Take 1 capsule by mouth daily.   cyanocobalamin (VITAMIN B12) 1000 MCG tablet Take 1,000 mcg by mouth daily.   dutasteride (AVODART) 0.5 MG capsule Take 0.5 mg by mouth every other day.   hydrocortisone (PROCTOSOL HC) 2.5 % rectal cream Place 1 Application rectally 2 (two) times daily.   levothyroxine (SYNTHROID, LEVOTHROID) 75 MCG tablet Take 1 tablet by mouth daily.   Multiple Vitamin (MULTIVITAMIN) tablet Take 2 tablets by mouth daily. Unknown strength   Omega-3 Fatty Acids (FISH OIL) 1200 MG CAPS Take 1,200 mg by mouth 2 (two) times daily.    OXYGEN  Inhale 2 mLs into the lungs at bedtime.   rosuvastatin (CRESTOR) 5 MG tablet Take 5 mg by mouth daily.   Tadalafil  2.5 MG TABS Take 1 tablet by mouth daily.   "

## 2024-07-06 ENCOUNTER — Telehealth: Payer: Self-pay | Admitting: *Deleted

## 2024-07-06 NOTE — Telephone Encounter (Signed)
"  ° °  Pre-operative Risk Assessment    Patient Name: [name]  DOB: July 07, 1940 MRN: [id number]      Request for Surgical Clearance    Procedure:  MRI Fusion Biopsy  Date of Surgery:  Clearance 07/24/24                                 Surgeon:  Dr. Germain Brothers, MD Surgeon's Group or Practice Name:  [facility] Phone number:  [phone] Fax number:  [phone]   Type of Clearance Requested:   - Medical  - Pharmacy:  Hold Aspirin Hold for 5 days prior to procedure   Type of Anesthesia:  Not Indicated   Additional requests/questions:    Bonney Arloa Donovan Levorn   07/06/2024, 1:15 PM   "

## 2024-07-07 NOTE — Telephone Encounter (Signed)
" ° °  Patient Name: [name]  DOB: Aug 27, 1940 MRN: [id number]  Primary Cardiologist: Lamar Fitch, MD  Chart reviewed as part of pre-operative protocol coverage. Pre-op clearance already addressed by colleagues in earlier phone notes. To summarize recommendations:  -Yes, we should proceed with biopsy. -Dr. Fitch [place] to hold aspirin x 5 days prior to procedure and resume when medically safe to do so.  Will route this bundled recommendation to requesting provider via Epic fax function and remove from pre-op pool. Please call with questions.  Orren LOISE Fabry, PA-C 07/07/2024, 1:40 PM  "
